# Patient Record
Sex: Female | Born: 1945 | Race: White | Hispanic: No | Marital: Married | Smoking: Never smoker
Health system: Southern US, Community
[De-identification: ages and names within clinical notes are randomized; demographics above are authoritative.]

## PROBLEM LIST (undated history)

## (undated) DIAGNOSIS — R51 Headache: Secondary | ICD-10-CM

## (undated) DIAGNOSIS — E7800 Pure hypercholesterolemia, unspecified: Secondary | ICD-10-CM

## (undated) DIAGNOSIS — Z9889 Other specified postprocedural states: Secondary | ICD-10-CM

## (undated) DIAGNOSIS — I519 Heart disease, unspecified: Secondary | ICD-10-CM

## (undated) DIAGNOSIS — K259 Gastric ulcer, unspecified as acute or chronic, without hemorrhage or perforation: Secondary | ICD-10-CM

## (undated) DIAGNOSIS — I809 Phlebitis and thrombophlebitis of unspecified site: Secondary | ICD-10-CM

## (undated) DIAGNOSIS — K5792 Diverticulitis of intestine, part unspecified, without perforation or abscess without bleeding: Secondary | ICD-10-CM

## (undated) DIAGNOSIS — I10 Essential (primary) hypertension: Secondary | ICD-10-CM

## (undated) DIAGNOSIS — R112 Nausea with vomiting, unspecified: Secondary | ICD-10-CM

## (undated) DIAGNOSIS — E785 Hyperlipidemia, unspecified: Secondary | ICD-10-CM

## (undated) DIAGNOSIS — I2510 Atherosclerotic heart disease of native coronary artery without angina pectoris: Secondary | ICD-10-CM

## (undated) HISTORY — PX: VEIN LIGATION: SHX2652

## (undated) HISTORY — PX: BREAST BIOPSY: SHX20

## (undated) HISTORY — PX: CATARACT EXTRACTION: SUR2

## (undated) HISTORY — DX: Pure hypercholesterolemia, unspecified: E78.00

## (undated) HISTORY — DX: Hyperlipidemia, unspecified: E78.5

## (undated) HISTORY — PX: ABDOMINAL HYSTERECTOMY: SUR658

## (undated) HISTORY — PX: CORONARY STENT PLACEMENT: SHX1402

## (undated) HISTORY — DX: Gastric ulcer, unspecified as acute or chronic, without hemorrhage or perforation: K25.9

## (undated) HISTORY — DX: Heart disease, unspecified: I51.9

## (undated) HISTORY — DX: Phlebitis and thrombophlebitis of unspecified site: I80.9

## (undated) HISTORY — DX: Headache: R51

## (undated) HISTORY — PX: OTHER SURGICAL HISTORY: SHX169

## (undated) HISTORY — DX: Atherosclerotic heart disease of native coronary artery without angina pectoris: I25.10

## (undated) HISTORY — PX: VAGINAL HYSTERECTOMY: SHX2639

---

## 1998-07-14 ENCOUNTER — Ambulatory Visit (HOSPITAL_COMMUNITY): Admission: RE | Admit: 1998-07-14 | Discharge: 1998-07-14 | Payer: Self-pay | Admitting: *Deleted

## 1998-08-25 ENCOUNTER — Ambulatory Visit (HOSPITAL_COMMUNITY): Admission: RE | Admit: 1998-08-25 | Discharge: 1998-08-25 | Payer: Self-pay | Admitting: Gastroenterology

## 2004-01-22 ENCOUNTER — Ambulatory Visit (HOSPITAL_COMMUNITY): Admission: RE | Admit: 2004-01-22 | Discharge: 2004-01-22 | Payer: Self-pay | Admitting: Unknown Physician Specialty

## 2010-01-05 ENCOUNTER — Inpatient Hospital Stay (HOSPITAL_BASED_OUTPATIENT_CLINIC_OR_DEPARTMENT_OTHER): Admission: RE | Admit: 2010-01-05 | Discharge: 2010-01-05 | Payer: Self-pay | Admitting: Cardiology

## 2010-01-05 HISTORY — PX: CARDIAC CATHETERIZATION: SHX172

## 2010-01-08 ENCOUNTER — Inpatient Hospital Stay (HOSPITAL_COMMUNITY): Admission: RE | Admit: 2010-01-08 | Discharge: 2010-01-09 | Payer: Self-pay | Admitting: Cardiology

## 2010-01-08 DIAGNOSIS — I2510 Atherosclerotic heart disease of native coronary artery without angina pectoris: Secondary | ICD-10-CM

## 2010-01-08 HISTORY — DX: Atherosclerotic heart disease of native coronary artery without angina pectoris: I25.10

## 2010-01-08 HISTORY — PX: CARDIAC CATHETERIZATION: SHX172

## 2010-10-05 ENCOUNTER — Ambulatory Visit: Payer: Self-pay | Admitting: Cardiology

## 2011-01-07 ENCOUNTER — Other Ambulatory Visit: Payer: Self-pay | Admitting: *Deleted

## 2011-01-07 DIAGNOSIS — Z1231 Encounter for screening mammogram for malignant neoplasm of breast: Secondary | ICD-10-CM

## 2011-01-18 ENCOUNTER — Ambulatory Visit
Admission: RE | Admit: 2011-01-18 | Discharge: 2011-01-18 | Disposition: A | Discharge status: Home / Self Care | Payer: Medicare Other | Source: Ambulatory Visit | Attending: *Deleted | Admitting: *Deleted

## 2011-01-18 DIAGNOSIS — Z1231 Encounter for screening mammogram for malignant neoplasm of breast: Secondary | ICD-10-CM

## 2011-02-04 LAB — BASIC METABOLIC PANEL
CO2: 25 mEq/L (ref 19–32)
CO2: 27 mEq/L (ref 19–32)
Chloride: 106 mEq/L (ref 96–112)
GFR calc Af Amer: >60 mL/min (ref >60)
GFR calc Af Amer: >60 mL/min (ref >60)
GFR calc non Af Amer: >60 mL/min (ref >60)
Glucose, Bld: 88 mg/dL (ref 70–99)
Glucose, Bld: 97 mg/dL (ref 70–99)
Potassium: 3.6 mEq/L (ref 3.5–5.1)
Potassium: 4.2 mEq/L (ref 3.5–5.1)
Sodium: 139 mEq/L (ref 135–145)
Sodium: 140 mEq/L (ref 135–145)

## 2011-02-04 LAB — CBC
Hemoglobin: 12.8 g/dL (ref 12.0–15.0)
MCV: 88.2 fL (ref 78.0–100.0)
Platelets: 199 10*3/uL (ref 150–400)
RBC: 3.9 MIL/uL (ref 3.87–5.11)
RDW: 12.1 % (ref 11.5–15.5)

## 2011-02-04 LAB — PROTIME-INR: INR: 0.93 (ref 0.00–1.49)

## 2011-04-01 ENCOUNTER — Encounter: Payer: Self-pay | Admitting: Cardiology

## 2011-04-01 DIAGNOSIS — R51 Headache: Secondary | ICD-10-CM | POA: Insufficient documentation

## 2011-04-01 DIAGNOSIS — E7800 Pure hypercholesterolemia, unspecified: Secondary | ICD-10-CM | POA: Insufficient documentation

## 2011-04-01 DIAGNOSIS — B029 Zoster without complications: Secondary | ICD-10-CM | POA: Insufficient documentation

## 2011-04-01 DIAGNOSIS — R519 Headache, unspecified: Secondary | ICD-10-CM | POA: Insufficient documentation

## 2011-04-05 ENCOUNTER — Ambulatory Visit: Payer: Medicare Other | Admitting: Cardiology

## 2011-04-08 ENCOUNTER — Encounter: Payer: Self-pay | Admitting: Cardiology

## 2011-04-08 ENCOUNTER — Ambulatory Visit (INDEPENDENT_AMBULATORY_CARE_PROVIDER_SITE_OTHER): Payer: Medicare Other | Admitting: Cardiology

## 2011-04-08 VITALS — BP 124/70 | HR 71 | Ht 61.0 in | Wt 155.2 lb

## 2011-04-08 DIAGNOSIS — E7800 Pure hypercholesterolemia, unspecified: Secondary | ICD-10-CM

## 2011-04-08 DIAGNOSIS — I2510 Atherosclerotic heart disease of native coronary artery without angina pectoris: Secondary | ICD-10-CM

## 2011-04-08 NOTE — Patient Instructions (Signed)
I would recommend increasing your Lipitor to 20 mg daily.  Recheck your lipids and liver function in 2-3 months.  Your goal LDL is 70. Your last LDL was 106.  I will see you back in 6 months.

## 2011-04-08 NOTE — Assessment & Plan Note (Signed)
She remains asymptomatic at this point. I have encouraged her to remain active. She will remain on aspirin. Continue risk factor modification.

## 2011-04-08 NOTE — Progress Notes (Signed)
   Ashley [name] Date of Birth: 03-21-1946   History of Present Illness: Ashley [name] is seen today for followup. 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 She continues to be active. She's had no significant chest pain or shortness of breath. She has had no myalgias.  Current Outpatient Prescriptions on File Prior to Visit  Medication Sig Dispense Refill  . aspirin 81 MG tablet Take 81 mg by mouth daily.        Marland Kitchen atorvastatin (LIPITOR) 10 MG tablet Take 10 mg by mouth daily.        . Calcium Carbonate-Vitamin D (CALCIUM + D PO) Take 600 mg by mouth daily.        Marland Kitchen estrogens, conjugated, (PREMARIN) 0.625 MG tablet Take 0.625 mg by mouth daily. Take daily for 21 days then do not take for 7 days.       Marland Kitchen DISCONTD: fish oil-omega-3 fatty acids 1000 MG capsule Take 1,200 mg by mouth daily. 3 TABLETS DAILY         Allergies  Allergen Reactions  . Declomycin   . Demerol     Past Medical History  Diagnosis Date  . Headache   . Herpes zoster   . Hypercholesterolemia   . Coronary artery disease 01/08/10    STATUS POST STENTING OF THE LAD AND THE FIRST OBTUSE MARGINAL VESSEL    Past Surgical History  Procedure Date  . Cardiac catheterization 01/08/10  . Cardiac catheterization 01/05/10    NORMAL LEFT VENTRICULAR SIZE AND NORMAL SYSTOLIC FUNCTION. EF IS 60%  . Abdominal hysterectomy     History  Smoking status  . Never Smoker   Smokeless tobacco  . Never Used    History  Alcohol Use No    Family History  Problem Relation Age of Onset  . Cancer Father   . Emphysema Father   . Lupus Child     Review of Systems: All other systems were reviewed and are negative.  Physical Exam: BP 124/70  Pulse 71  Ht 5\' 1"  (1.549 m)  Wt 155 lb 4 oz (70.421 kg)  BMI 29.33 kg/m2 She is a pleasant white female who is in no distress. HEENT exam is unremarkable. She has no JVD or bruits. Lungs are  clear. Cardiac exam reveals a regular rate and rhythm without gallop or murmur. Abdomen is soft and nontender. She has no edema. Pedal pulses are good. Neurologic exam is nonfocal. LABORATORY DATA: ECG demonstrates normal sinus rhythm with a normal ECG.  Assessment / Plan:

## 2011-04-08 NOTE — Assessment & Plan Note (Signed)
Her most recent lipid panel demonstrated a total cholesterol of 178, triglycerides 76, LDL 106, and HDL 57. Her goal LDL is 70. I have recommended increasing her Lipitor to 20 mg per day and repeating a lipid panel in 2-3 months. Otherwise I will follow up again in 6 months.

## 2011-06-30 ENCOUNTER — Ambulatory Visit: Payer: Medicare Other | Admitting: Gastroenterology

## 2011-07-05 ENCOUNTER — Ambulatory Visit (INDEPENDENT_AMBULATORY_CARE_PROVIDER_SITE_OTHER): Payer: Medicare Other | Admitting: Gastroenterology

## 2011-07-05 ENCOUNTER — Encounter: Payer: Self-pay | Admitting: Gastroenterology

## 2011-07-05 VITALS — BP 139/69 | HR 73 | Temp 98.2°F | Ht 61.0 in | Wt 152.2 lb

## 2011-07-05 DIAGNOSIS — R159 Full incontinence of feces: Secondary | ICD-10-CM

## 2011-07-05 DIAGNOSIS — R198 Other specified symptoms and signs involving the digestive system and abdomen: Secondary | ICD-10-CM

## 2011-07-05 DIAGNOSIS — R194 Change in bowel habit: Secondary | ICD-10-CM | POA: Insufficient documentation

## 2011-07-05 HISTORY — DX: Full incontinence of feces: R15.9

## 2011-07-05 NOTE — Assessment & Plan Note (Signed)
Bowel habit change with episodes of passing stool when not having urge. No real incontinence. Now with more constipation. Discuss bowel regimen options. She is resistant to daily medications. Encouraged daily fiber supplement, stool softner, probiotics. Remote TCS, therefore encourage colonoscopy.  I have discussed the risks, alternatives, benefits with regards to but not limited to the risk of reaction to medication, bleeding, infection, perforation and the patient is agreeable to proceed. Written consent to be obtained.

## 2011-07-05 NOTE — Patient Instructions (Addendum)
For constipation you may take colace 100mg  twice daily, fiber supplement that has 3-4 grams of fiber daily. Try adding probiotic to stabilize bowel function. Probiotics can be found in yogurt, Dannon or Activia. You can also buy it in pill form, [facility] is good choice.

## 2011-07-05 NOTE — Progress Notes (Signed)
Cc to PCP 

## 2011-07-05 NOTE — Assessment & Plan Note (Signed)
See bowel habit changes.

## 2011-07-05 NOTE — Progress Notes (Signed)
Primary Care Physician:  Edilia Bo, PA  Primary Gastroenterologist:  Roetta Sessions, MD   Chief Complaint  Patient presents with  . Encopresis  . Constipation    HPI:  Ashley [name] is a 65 y.o. female here for further evaluation of bowel change and fecal incontinence. Reports having remote Egd/tcs years ago in [place]. Denies h/o colon polyps or PUD. Has hh. Recently had gas pain on left abdomen. Saw Prudy Feeler, PA. Had bloodwork done but doesn't know results. LFTs were normal. Also c/o several days of fecal incontinence a few weeks ago. Would pass soft stool with urinating and not be aware of it. Now with c/o constipation. PCP told to her to take miralax but she had nausea with it so she stopped. Took fiber wafer today since no BM. Doesn't take any medications regularly for constipation. Denies heartburn. No dysphagia. No further abd pain.   Current Outpatient Prescriptions  Medication Sig Dispense Refill  . aspirin 81 MG tablet Take 81 mg by mouth daily.        Marland Kitchen atorvastatin (LIPITOR) 10 MG tablet Take 10 mg by mouth daily.        . Calcium Carbonate-Vitamin D (CALCIUM + D PO) Take 600 mg by mouth daily.        Marland Kitchen estrogens, conjugated, (PREMARIN) 0.625 MG tablet Take 0.625 mg by mouth daily. Take daily for 21 days then do not take for 7 days.         Allergies as of 07/05/2011 - Review Complete 07/05/2011  Allergen Reaction Noted  . Declomycin  04/01/2011  . Demerol  04/01/2011    Past Medical History  Diagnosis Date  . Headache   . Herpes zoster   . Hypercholesterolemia   . Coronary artery disease 01/08/10    STATUS POST STENTING OF THE LAD AND THE FIRST OBTUSE MARGINAL VESSEL    Past Surgical History  Procedure Date  . Cardiac catheterization 01/08/10  . Cardiac catheterization 01/05/10    NORMAL LEFT VENTRICULAR SIZE AND NORMAL SYSTOLIC FUNCTION. EF IS 60%.   . Abdominal hysterectomy     with bladder tack  . Vein ligation   . Colonoscopy     remote    Family  History  Problem Relation Age of Onset  . Cancer Father     unknown  . Emphysema Father   . Lupus Child   . Colon cancer Neg Hx     History   Social History  . Marital Status: Married    Spouse Name: N/A    Number of Children: 5  . Years of Education: N/A   Occupational History  . housework    Social History Main Topics  . Smoking status: Never Smoker   . Smokeless tobacco: Never Used  . Alcohol Use: No  . Drug Use: No  . Sexually Active: Not on file   Other Topics Concern  . Not on file   Social History Narrative  . No narrative on file      ROS:  General: Negative for anorexia, weight loss, fever, chills, fatigue, weakness. Eyes: Negative for vision changes.  ENT: Negative for hoarseness, difficulty swallowing , nasal congestion. CV: Negative for chest pain, angina, palpitations, dyspnea on exertion, peripheral edema.  Respiratory: Negative for dyspnea at rest, dyspnea on exertion, cough, sputum, wheezing.  GI: See history of present illness. GU:  Negative for dysuria, hematuria. Some stress urinary incontinence. No urinary frequency, nocturnal urination.  MS: Negative for joint pain, low back  pain.  Derm: Negative for rash or itching.  Neuro: Negative for weakness, abnormal sensation, seizure, frequent headaches, memory loss, confusion.  Psych: Negative for anxiety, depression, suicidal ideation, hallucinations.  Endo: Negative for unusual weight change.  Heme: Negative for bruising or bleeding. Allergy: Negative for rash or hives.    Physical Examination:  BP 139/69  Pulse 73  Temp(Src) 98.2 F (36.8 C) (Temporal)  Ht 5\' 1"  (1.549 m)  Wt 152 lb 3.2 oz (69.037 kg)  BMI 28.76 kg/m2   General: Well-nourished, well-developed in no acute distress.  Head: Normocephalic, atraumatic.   Eyes: Conjunctiva pink, no icterus. Mouth: Oropharyngeal mucosa moist and pink , no lesions erythema or exudate. Neck: Supple without thyromegaly, masses, or  lymphadenopathy.  Lungs: Clear to auscultation bilaterally.  Heart: Regular rate and rhythm, no murmurs rubs or gallops.  Abdomen: Bowel sounds are normal, nontender, nondistended, no hepatosplenomegaly or masses, no abdominal bruits or    hernia , no rebound or guarding.   Rectal: Defer to time of TCS. Extremities: No lower extremity edema. No clubbing or deformities.  Neuro: Alert and oriented x 4 , grossly normal neurologically.  Skin: Warm and dry, no rash or jaundice.   Psych: Alert and cooperative, normal mood and affect.  Labs: Tbili 0.6, AST 25, ALT 21, ALP 52, alb 4

## 2011-07-12 MED ORDER — SODIUM CHLORIDE 0.45 % IV SOLN: Freq: Once | INTRAVENOUS | Status: DC

## 2011-07-13 ENCOUNTER — Encounter (HOSPITAL_COMMUNITY): Payer: Self-pay | Admitting: *Deleted

## 2011-07-13 ENCOUNTER — Encounter (HOSPITAL_COMMUNITY)
Admission: RE | Disposition: A | Discharge status: Home / Self Care | Payer: Self-pay | Source: Ambulatory Visit | Attending: Internal Medicine

## 2011-07-13 ENCOUNTER — Ambulatory Visit (HOSPITAL_COMMUNITY)
Admission: RE | Admit: 2011-07-13 | Discharge: 2011-07-13 | Disposition: A | Discharge status: Home / Self Care | Payer: Medicare Other | Source: Ambulatory Visit | Attending: Internal Medicine | Admitting: Internal Medicine

## 2011-07-13 DIAGNOSIS — R198 Other specified symptoms and signs involving the digestive system and abdomen: Secondary | ICD-10-CM

## 2011-07-13 DIAGNOSIS — Z7982 Long term (current) use of aspirin: Secondary | ICD-10-CM | POA: Insufficient documentation

## 2011-07-13 DIAGNOSIS — D126 Benign neoplasm of colon, unspecified: Secondary | ICD-10-CM | POA: Insufficient documentation

## 2011-07-13 DIAGNOSIS — R159 Full incontinence of feces: Secondary | ICD-10-CM | POA: Insufficient documentation

## 2011-07-13 HISTORY — PX: COLONOSCOPY: SHX5424

## 2011-07-13 SURGERY — COLONOSCOPY
Anesthesia: Moderate Sedation

## 2011-07-13 MED ORDER — MIDAZOLAM HCL 5 MG/5ML IJ SOLN
INTRAMUSCULAR | Status: AC
  Filled 2011-07-13: qty 10

## 2011-07-13 MED ORDER — FENTANYL CITRATE 0.05 MG/ML IJ SOLN
INTRAMUSCULAR | Status: DC | PRN
  Administered 2011-07-13: 50 ug via INTRAVENOUS
  Administered 2011-07-13 (×2): 25 ug via INTRAVENOUS

## 2011-07-13 MED ORDER — ONDANSETRON HCL 4 MG/2ML IJ SOLN
INTRAMUSCULAR | Status: DC | PRN
  Administered 2011-07-13: 4 mg via INTRAVENOUS

## 2011-07-13 MED ORDER — MIDAZOLAM HCL 5 MG/5ML IJ SOLN
INTRAMUSCULAR | Status: DC | PRN
  Administered 2011-07-13 (×2): 1 mg via INTRAVENOUS
  Administered 2011-07-13 (×2): 2 mg via INTRAVENOUS
  Administered 2011-07-13: 1 mg via INTRAVENOUS

## 2011-07-13 MED ORDER — FENTANYL CITRATE 0.05 MG/ML IJ SOLN
INTRAMUSCULAR | Status: AC
  Filled 2011-07-13: qty 2

## 2011-07-13 MED ORDER — ONDANSETRON HCL 4 MG/2ML IJ SOLN
INTRAMUSCULAR | Status: AC
  Filled 2011-07-13: qty 2

## 2011-07-13 NOTE — H&P (Addendum)
Tana Coast, PA 07/05/2011 12:59 PM Signed  Primary Care Physician: Edilia Bo, PA  Primary Gastroenterologist: Roetta Sessions, MD  Chief Complaint   Patient presents with   .  Encopresis   .  Constipation    HPI: Ashley [name] is a 65 y.o. female here for further evaluation of bowel change and fecal incontinence. Reports having remote Egd/tcs years ago in [place]. Denies h/o colon polyps or PUD. Has hh. Recently had gas pain on left abdomen. Saw Prudy Feeler, PA. Had bloodwork done but doesn't know results. LFTs were normal. Also c/o several days of fecal incontinence a few weeks ago. Would pass soft stool with urinating and not be aware of it. Now with c/o constipation. PCP told to her to take miralax but she had nausea with it so she stopped. Took fiber wafer today since no BM. Doesn't take any medications regularly for constipation. Denies heartburn. No dysphagia. No further abd pain.  Current Outpatient Prescriptions   Medication  Sig  Dispense  Refill   .  aspirin 81 MG tablet  Take 81 mg by mouth daily.     Marland Kitchen  atorvastatin (LIPITOR) 10 MG tablet  Take 10 mg by mouth daily.     .  Calcium Carbonate-Vitamin D (CALCIUM + D PO)  Take 600 mg by mouth daily.     Marland Kitchen  estrogens, conjugated, (PREMARIN) 0.625 MG tablet  Take 0.625 mg by mouth daily. Take daily for 21 days then do not take for 7 days.      Allergies as of 07/05/2011 - Review Complete 07/05/2011   Allergen  Reaction  Noted   .  Declomycin   04/01/2011   .  Demerol   04/01/2011    Past Medical History   Diagnosis  Date   .  Headache    .  Herpes zoster    .  Hypercholesterolemia    .  Coronary artery disease  01/08/10     STATUS POST STENTING OF THE LAD AND THE FIRST OBTUSE MARGINAL VESSEL    Past Surgical History   Procedure  Date   .  Cardiac catheterization  01/08/10   .  Cardiac catheterization  01/05/10     NORMAL LEFT VENTRICULAR SIZE AND NORMAL SYSTOLIC FUNCTION. EF IS 60%.   .  Abdominal hysterectomy      with bladder  tack   .  Vein ligation    .  Colonoscopy      remote    Family History   Problem  Relation  Age of Onset   .  Cancer  Father       unknown    .  Emphysema  Father    .  Lupus  Child    .  Colon cancer  Neg Hx     History    Social History   .  Marital Status:  Married     Spouse Name:  N/A     Number of Children:  5   .  Years of Education:  N/A    Occupational History   .  housework     Social History Main Topics   .  Smoking status:  Never Smoker   .  Smokeless tobacco:  Never Used   .  Alcohol Use:  No   .  Drug Use:  No   .  Sexually Active:  Not on file    Other Topics  Concern   .  Not on file  Social History Narrative   .  No narrative on file    ROS:  General: Negative for anorexia, weight loss, fever, chills, fatigue, weakness.  Eyes: Negative for vision changes.  ENT: Negative for hoarseness, difficulty swallowing , nasal congestion.  CV: Negative for chest pain, angina, palpitations, dyspnea on exertion, peripheral edema.  Respiratory: Negative for dyspnea at rest, dyspnea on exertion, cough, sputum, wheezing.  GI: See history of present illness.  GU: Negative for dysuria, hematuria. Some stress urinary incontinence. No urinary frequency, nocturnal urination.  MS: Negative for joint pain, low back pain.  Derm: Negative for rash or itching.  Neuro: Negative for weakness, abnormal sensation, seizure, frequent headaches, memory loss, confusion.  Psych: Negative for anxiety, depression, suicidal ideation, hallucinations.  Endo: Negative for unusual weight change.  Heme: Negative for bruising or bleeding.  Allergy: Negative for rash or hives.  Physical Examination:  BP 139/69  Pulse 73  Temp(Src) 98.2 F (36.8 C) (Temporal)  Ht 5\' 1"  (1.549 m)  Wt 152 lb 3.2 oz (69.037 kg)  BMI 28.76 kg/m2  General: Well-nourished, well-developed in no acute distress.  Head: Normocephalic, atraumatic.  Eyes: Conjunctiva pink, no icterus.  Mouth: Oropharyngeal  mucosa moist and pink , no lesions erythema or exudate.  Neck: Supple without thyromegaly, masses, or lymphadenopathy.  Lungs: Clear to auscultation bilaterally.  Heart: Regular rate and rhythm, no murmurs rubs or gallops.  Abdomen: Bowel sounds are normal, nontender, nondistended, no hepatosplenomegaly or masses, no abdominal bruits or hernia , no rebound or guarding.  Rectal: Defer to time of TCS.  Extremities: No lower extremity edema. No clubbing or deformities.  Neuro: Alert and oriented x 4 , grossly normal neurologically.  Skin: Warm and dry, no rash or jaundice.  Psych: Alert and cooperative, normal mood and affect.  Labs:  Tbili 0.6, AST 25, ALT 21, ALP 52, alb 4   Leigh A Watson 07/05/2011 1:10 PM Signed  Cc to PCP           Bowel habit changes - Tana Coast, PA 07/05/2011 12:56 PM Signed  Bowel habit change with episodes of passing stool when not having urge. No real incontinence. Now with more constipation. Discuss bowel regimen options. She is resistant to daily medications. Encouraged daily fiber supplement, stool softner, probiotics. Remote TCS, therefore encourage colonoscopy. I have discussed the risks, alternatives, benefits with regards to but not limited to the risk of reaction to medication, bleeding, infection, perforation and the patient is agreeable to proceed. Written consent to be obtained.   Fecal incontinence - Tana Coast, PA 07/05/2011 12:56 PM Signed  See bowel habit changes.      I have seen the patient prior to the procedure(s) today and reviewed the history and physical / consultation from 07/05/11.  There have been no changes. After consideration of the risks, benefits, alternatives and imponderables, the patient has consented to the procedure(s).

## 2011-07-13 NOTE — Progress Notes (Signed)
During colonoscopy, a splenic flexure polyp was removed by cold snare, but was unable to be retrieved. Dr. Jena Gauss was notified.

## 2011-07-21 ENCOUNTER — Encounter (HOSPITAL_COMMUNITY): Payer: Self-pay | Admitting: Internal Medicine

## 2011-08-08 ENCOUNTER — Encounter: Payer: Self-pay | Admitting: Internal Medicine

## 2011-08-08 NOTE — Progress Notes (Signed)
5 yr Repeat TCS is in the computer

## 2011-08-08 NOTE — Progress Notes (Signed)
  Please call pt and see how he is doing  Please her know polyp was too small to be processed (no path); recommend F/U tcs 5 years  Please confirm f/u appt w pt

## 2011-08-11 NOTE — Progress Notes (Signed)
Tried to call pt- LMOM 

## 2011-08-11 NOTE — Progress Notes (Signed)
Pt has follow up ov on 08/22/11

## 2011-08-19 NOTE — Progress Notes (Signed)
Pt aware, she is coming to appt on Monday.

## 2011-08-22 ENCOUNTER — Encounter: Payer: Self-pay | Admitting: Gastroenterology

## 2011-08-22 ENCOUNTER — Ambulatory Visit (INDEPENDENT_AMBULATORY_CARE_PROVIDER_SITE_OTHER): Payer: Medicare Other | Admitting: Gastroenterology

## 2011-08-22 VITALS — BP 110/70 | HR 75 | Temp 98.0°F | Ht 61.0 in | Wt 149.2 lb

## 2011-08-22 DIAGNOSIS — R198 Other specified symptoms and signs involving the digestive system and abdomen: Secondary | ICD-10-CM

## 2011-08-22 DIAGNOSIS — R194 Change in bowel habit: Secondary | ICD-10-CM

## 2011-08-22 DIAGNOSIS — R159 Full incontinence of feces: Secondary | ICD-10-CM

## 2011-08-22 MED ORDER — INULIN 2 G PO CHEW: 2.0000 | CHEWABLE_TABLET | Freq: Every day | ORAL | Status: DC

## 2011-08-22 NOTE — Assessment & Plan Note (Signed)
Intermittent constipation but she also has several days per week where she stools frequently with urination. States she feels like her rectum doesn't empty completely as the stool just keeps coming. Lax sphincter tone on exam. As previously recommended, suggest stool bulking agent such as fiber initially. If fiber supplement does not help then we could consider low-dose resin binder to make her relatively constipated. See patient instructions for details. PR in two weeks.

## 2011-08-22 NOTE — Progress Notes (Signed)
Primary Care Physician: Alcide Evener, DO  Primary Gastroenterologist:  Roetta Sessions, MD   Chief Complaint  Patient presents with  . Follow-up    constipation    HPI: Ashley [name] is a 65 y.o. female here for followup. Recently had a colonoscopy which showed redundant colon, left-sided diverticula, small polyp at the splenic flexure, lax sphincter tone. Since her last office visit, she started taking [employer] colon health. She is not taking a fiber supplement. Continues to have several days and right were she will have stool come out when she urinates. This happens usually around 9 AM and she is having to miss morning mass. Then she may have one or 2 days without a bowel movement. She is doing the recommended Kegel exercises. Denies melena or rectal bleeding.    Current Outpatient Prescriptions  Medication Sig Dispense Refill  . aspirin 81 MG tablet Take 81 mg by mouth daily.        Marland Kitchen atorvastatin (LIPITOR) 10 MG tablet Take 10 mg by mouth daily.        . Calcium Carbonate-Vitamin D (CALCIUM + D PO) Take 600 mg by mouth daily.        Marland Kitchen estrogens, conjugated, (PREMARIN) 0.625 MG tablet Take 0.625 mg by mouth daily. Take daily for 21 days then do not take for 7 days.       . Probiotic Product ([employer]IPS COLON HEALTH PO) Take by mouth.        . Inulin (FIBERCHOICE) 2 G CHEW Chew 2 tablets (4 g total) by mouth daily.    0    Allergies as of 08/22/2011 - Review Complete 08/22/2011  Allergen Reaction Noted  . Declomycin Hives 04/01/2011  . Demerol Nausea And Vomiting 04/01/2011    ROS:  General: Negative for anorexia, weight loss, fever, chills, fatigue, weakness. ENT: Negative for hoarseness, difficulty swallowing , nasal congestion. CV: Negative for chest pain, angina, palpitations, dyspnea on exertion, peripheral edema.  Respiratory: Negative for dyspnea at rest, dyspnea on exertion, cough, sputum, wheezing.  GI: See history of present illness. GU:  Negative for dysuria,  hematuria, urinary incontinence, nocturnal urination. Urinates frequently due to her stress urinary incontinence  Endo: Negative for unusual weight change.    Physical Examination:   BP 110/70  Pulse 75  Temp(Src) 98 F (36.7 C) (Temporal)  Ht 5\' 1"  (1.549 m)  Wt 149 lb 3.2 oz (67.677 kg)  BMI 28.19 kg/m2  General: Well-nourished, well-developed in no acute distress.  Eyes: No icterus. Mouth: Oropharyngeal mucosa moist and pink , no lesions erythema or exudate. Lungs: Clear to auscultation bilaterally.  Heart: Regular rate and rhythm, no murmurs rubs or gallops.  Abdomen: Bowel sounds are normal, nontender, nondistended, no hepatosplenomegaly or masses, no abdominal bruits or hernia , no rebound or guarding.   Extremities: No lower extremity edema. No clubbing or deformities. Neuro: Alert and oriented x 4   Skin: Warm and dry, no jaundice.   Psych: Alert and cooperative, normal mood and affect.

## 2011-08-22 NOTE — Patient Instructions (Addendum)
Please begin separate fiber supplement. FiberChoice two chewable tablets daily OR Benefiber chewable tablets (3-4 grams daily). Please continue [employer] [facility].  Call in two weeks with progress report. If you still have problems with uncontrollable stooling, we will start you on Questran, low-dose. Continue Kegel exercises for 10 minutes twice daily.

## 2011-08-22 NOTE — Progress Notes (Signed)
Cc to PCP 

## 2011-10-14 ENCOUNTER — Encounter: Payer: Self-pay | Admitting: Cardiology

## 2011-10-14 ENCOUNTER — Ambulatory Visit (INDEPENDENT_AMBULATORY_CARE_PROVIDER_SITE_OTHER): Payer: Medicare Other | Admitting: Cardiology

## 2011-10-14 VITALS — BP 124/66 | HR 60 | Ht 61.0 in | Wt 144.0 lb

## 2011-10-14 DIAGNOSIS — I2510 Atherosclerotic heart disease of native coronary artery without angina pectoris: Secondary | ICD-10-CM

## 2011-10-14 DIAGNOSIS — E7800 Pure hypercholesterolemia, unspecified: Secondary | ICD-10-CM

## 2011-10-14 DIAGNOSIS — E785 Hyperlipidemia, unspecified: Secondary | ICD-10-CM

## 2011-10-14 NOTE — Assessment & Plan Note (Signed)
She remains asymptomatic. We will continue with risk factor modification. I will followup again in 6 months.

## 2011-10-14 NOTE — Assessment & Plan Note (Signed)
Improved lipid control on Lipitor. We will continue with her current dose.

## 2011-10-14 NOTE — Patient Instructions (Signed)
Continue your current medications.  Stay active.  I will see you again in 6 months.   

## 2011-10-14 NOTE — Progress Notes (Signed)
   [name] Date of Birth: [birth year]   History of Present Illness: Ashley [name] is seen today for followup. She states she has been doing very well and remains very active. Her husband is on a began diet and she partakes in this much of the time. She is tolerating the higher dose of Lipitor. She denies any chest pain or shortness of breath.  Current Outpatient Prescriptions on File Prior to Visit  Medication Sig Dispense Refill  . aspirin 81 MG tablet Take 81 mg by mouth daily.        . Calcium Carbonate-Vitamin D (CALCIUM + D PO) Take 600 mg by mouth daily.        Marland Kitchen estrogens, conjugated, (PREMARIN) 0.625 MG tablet Take 0.625 mg by mouth daily. Take daily for 21 days then do not take for 7 days.       . Inulin (FIBERCHOICE) 2 G CHEW Chew 2 tablets (4 g total) by mouth daily.    0  . Probiotic Product (PHILLIPS COLON HEALTH PO) Take by mouth.          Allergies  Allergen Reactions  . Declomycin Hives  . Demerol Nausea And Vomiting    Past Medical History  Diagnosis Date  . Headache   . Herpes zoster   . Hypercholesterolemia   . Coronary artery disease 01/08/10    STATUS POST STENTING OF THE LAD AND THE FIRST OBTUSE MARGINAL VESSEL    Past Surgical History  Procedure Date  . Cardiac catheterization 01/08/10  . Cardiac catheterization 01/05/10    NORMAL LEFT VENTRICULAR SIZE AND NORMAL SYSTOLIC FUNCTION. EF IS 60%.   . Abdominal hysterectomy     with bladder tack  . Vein ligation   . Colonoscopy 07/13/2011    Procedure: COLONOSCOPY;  Surgeon: Corbin Ade, MD;  Location: [facility];  Service: Endoscopy;  Laterality: N/A;  10:00 AM, lax sphincter tone, left-sided diverticula, small polyp too small to analyze. Redundant colon. Next colonoscopy recommended August 2017.    History  Smoking status  . Never Smoker   Smokeless tobacco  . Never Used    History  Alcohol Use No    Family History  Problem Relation Age of Onset  . Cancer Father     unknown  .  Emphysema Father   . Lupus Child   . Colon cancer Neg Hx     Review of Systems: As noted in history of present illness. All other systems were reviewed and are negative.  Physical Exam: BP 124/66  Pulse 60  Ht 5\' 1"  (1.549 m)  Wt 144 lb (65.318 kg)  BMI 27.21 kg/m2 She is a pleasant white female who is in no distress. HEENT exam is unremarkable. She has no JVD or bruits. Lungs are clear. Cardiac exam reveals a regular rate and rhythm without gallop or murmur. Abdomen is soft and nontender. She has no edema. Pedal pulses are good. Neurologic exam is nonfocal. LABORATORY DATA: Her last blood work was reviewed from August and showed a 20 point reduction in her cholesterol. Her HDL and triglyceride levels were stable.  Assessment / Plan:

## 2011-12-16 ENCOUNTER — Other Ambulatory Visit: Payer: Self-pay | Admitting: *Deleted

## 2011-12-16 DIAGNOSIS — Z1231 Encounter for screening mammogram for malignant neoplasm of breast: Secondary | ICD-10-CM

## 2012-01-24 ENCOUNTER — Ambulatory Visit
Admission: RE | Admit: 2012-01-24 | Discharge: 2012-01-24 | Disposition: A | Discharge status: Home / Self Care | Payer: Medicare Other | Source: Ambulatory Visit | Attending: *Deleted | Admitting: *Deleted

## 2012-01-24 DIAGNOSIS — Z1231 Encounter for screening mammogram for malignant neoplasm of breast: Secondary | ICD-10-CM

## 2012-08-15 DIAGNOSIS — Z23 Encounter for immunization: Secondary | ICD-10-CM | POA: Diagnosis not present

## 2012-09-17 DIAGNOSIS — E785 Hyperlipidemia, unspecified: Secondary | ICD-10-CM | POA: Diagnosis not present

## 2012-09-17 DIAGNOSIS — Z79899 Other long term (current) drug therapy: Secondary | ICD-10-CM | POA: Diagnosis not present

## 2012-10-04 ENCOUNTER — Ambulatory Visit (INDEPENDENT_AMBULATORY_CARE_PROVIDER_SITE_OTHER): Payer: Medicare Other | Admitting: Cardiology

## 2012-10-04 ENCOUNTER — Encounter: Payer: Self-pay | Admitting: Cardiology

## 2012-10-04 VITALS — BP 148/70 | HR 63 | Ht 61.0 in | Wt 143.8 lb

## 2012-10-04 DIAGNOSIS — I2510 Atherosclerotic heart disease of native coronary artery without angina pectoris: Secondary | ICD-10-CM | POA: Diagnosis not present

## 2012-10-04 DIAGNOSIS — E7800 Pure hypercholesterolemia, unspecified: Secondary | ICD-10-CM | POA: Diagnosis not present

## 2012-10-04 NOTE — Patient Instructions (Signed)
Continue your current therapy  I will see you again in 1 year.   

## 2012-10-04 NOTE — Progress Notes (Signed)
Ashley [name] Date of Birth: 07/01/1946   History of Present Illness: [name] is seen today for followup. She states she is done very well this year without any significant cardiac symptoms. She remains very active bowling and playing paddle ball. She denies any symptoms of chest pain or shortness of breath. She is status post stenting of the Roxanol LAD and first obtuse marginal vessel in 2011 with bare-metal stents. She is trying to follow a heart healthy diet.  Current Outpatient Prescriptions on File Prior to Visit  Medication Sig Dispense Refill  . aspirin 81 MG tablet Take 81 mg by mouth daily.        Marland Kitchen atorvastatin (LIPITOR) 20 MG tablet Take 20 mg by mouth daily.        . Calcium Carbonate-Vitamin D (CALCIUM + D PO) Take 600 mg by mouth daily.        Marland Kitchen estrogens, conjugated, (PREMARIN) 0.625 MG tablet Take 0.625 mg by mouth daily. Take daily for 21 days then do not take for 7 days.       . Inulin (FIBERCHOICE) 2 G CHEW Chew 2 tablets (4 g total) by mouth daily.    0  . Probiotic Product (PHILLIPS COLON HEALTH PO) Take by mouth.          Allergies  Allergen Reactions  . Demeclocycline Hcl Hives  . Demerol Nausea And Vomiting  . Moviprep (Peg-Kcl-Nacl-Nasulf-Na Asc-C)     Past Medical History  Diagnosis Date  . Headache   . Herpes zoster   . Hypercholesterolemia   . Coronary artery disease 01/08/10    STATUS POST STENTING OF THE LAD AND THE FIRST OBTUSE MARGINAL VESSEL    Past Surgical History  Procedure Date  . Cardiac catheterization 01/08/10  . Cardiac catheterization 01/05/10    NORMAL LEFT VENTRICULAR SIZE AND NORMAL SYSTOLIC FUNCTION. EF IS 60%.   . Abdominal hysterectomy     with bladder tack  . Vein ligation   . Colonoscopy 07/13/2011    Procedure: COLONOSCOPY;  Surgeon: Corbin Ade, MD;  Location: [facility];  Service: Endoscopy;  Laterality: N/A;  10:00 AM, lax sphincter tone, left-sided diverticula, small polyp too small to analyze. Redundant  colon. Next colonoscopy recommended August 2017.    History  Smoking status  . Never Smoker   Smokeless tobacco  . Never Used    History  Alcohol Use No    Family History  Problem Relation Age of Onset  . Cancer Father     unknown  . Emphysema Father   . Lupus Child   . Colon cancer Neg Hx     Review of Systems: As noted in history of present illness. All other systems were reviewed and are negative.  Physical Exam: BP 148/70  Pulse 63  Ht 5\' 1"  (1.549 m)  Wt 143 lb 12.8 oz (65.227 kg)  BMI 27.17 kg/m2  SpO2 99% She is a pleasant white female who is in no distress. HEENT exam is unremarkable. She has no JVD or bruits. Lungs are clear. Cardiac exam reveals a regular rate and rhythm without gallop or murmur. Abdomen is soft and nontender. She has no edema. Pedal pulses are good. Neurologic exam is nonfocal.  LABORATORY DATA: ECG today is normal. Blood work dated 09/18/2012 shows a normal chemistry panel, CBC, and TSH. Total cholesterol 196, triglycerides 72, HDL 74, LDL 108.  Assessment / Plan: 1. Coronary disease status post 2 vessel stenting with bare-metal stents 2011. She  remains asymptomatic. Continue with aspirin therapy. Continue on statin with Lipitor. I will followup again in one year.  2. Hypercholesterolemia. Compared to a year ago her LDL has increased by 20 points but still has her HDL. We will continue on 20 mg of Lipitor and encouraged and Mediterranean style diet.

## 2012-10-05 DIAGNOSIS — H40019 Open angle with borderline findings, low risk, unspecified eye: Secondary | ICD-10-CM | POA: Diagnosis not present

## 2012-10-05 DIAGNOSIS — H01009 Unspecified blepharitis unspecified eye, unspecified eyelid: Secondary | ICD-10-CM | POA: Diagnosis not present

## 2012-10-05 DIAGNOSIS — H2510 Age-related nuclear cataract, unspecified eye: Secondary | ICD-10-CM | POA: Diagnosis not present

## 2012-10-05 DIAGNOSIS — H04129 Dry eye syndrome of unspecified lacrimal gland: Secondary | ICD-10-CM | POA: Diagnosis not present

## 2012-11-08 DIAGNOSIS — R51 Headache: Secondary | ICD-10-CM | POA: Diagnosis not present

## 2013-01-03 ENCOUNTER — Other Ambulatory Visit: Payer: Self-pay | Admitting: *Deleted

## 2013-01-16 DIAGNOSIS — J029 Acute pharyngitis, unspecified: Secondary | ICD-10-CM | POA: Diagnosis not present

## 2013-01-29 ENCOUNTER — Ambulatory Visit: Payer: Medicare Other

## 2013-02-13 ENCOUNTER — Ambulatory Visit
Admission: RE | Admit: 2013-02-13 | Discharge: 2013-02-13 | Disposition: A | Discharge status: Home / Self Care | Payer: Medicare Other | Source: Ambulatory Visit | Attending: *Deleted | Admitting: *Deleted

## 2013-02-13 DIAGNOSIS — Z1231 Encounter for screening mammogram for malignant neoplasm of breast: Secondary | ICD-10-CM | POA: Diagnosis not present

## 2013-02-15 ENCOUNTER — Other Ambulatory Visit: Payer: Self-pay | Admitting: *Deleted

## 2013-02-15 DIAGNOSIS — R928 Other abnormal and inconclusive findings on diagnostic imaging of breast: Secondary | ICD-10-CM

## 2013-03-06 ENCOUNTER — Ambulatory Visit
Admission: RE | Admit: 2013-03-06 | Discharge: 2013-03-06 | Disposition: A | Discharge status: Home / Self Care | Payer: Medicare Other | Source: Ambulatory Visit | Attending: *Deleted | Admitting: *Deleted

## 2013-03-06 ENCOUNTER — Other Ambulatory Visit: Payer: Medicare Other

## 2013-03-06 DIAGNOSIS — R928 Other abnormal and inconclusive findings on diagnostic imaging of breast: Secondary | ICD-10-CM

## 2013-05-03 DIAGNOSIS — R51 Headache: Secondary | ICD-10-CM | POA: Diagnosis not present

## 2013-05-03 DIAGNOSIS — J329 Chronic sinusitis, unspecified: Secondary | ICD-10-CM | POA: Diagnosis not present

## 2013-07-23 ENCOUNTER — Encounter: Payer: Self-pay | Admitting: Obstetrics & Gynecology

## 2013-07-23 ENCOUNTER — Ambulatory Visit (INDEPENDENT_AMBULATORY_CARE_PROVIDER_SITE_OTHER): Payer: Medicare Other | Admitting: Obstetrics & Gynecology

## 2013-07-23 VITALS — BP 156/68 | HR 79 | Resp 16 | Ht 61.0 in | Wt 143.0 lb

## 2013-07-23 DIAGNOSIS — N951 Menopausal and female climacteric states: Secondary | ICD-10-CM

## 2013-07-23 DIAGNOSIS — Z01419 Encounter for gynecological examination (general) (routine) without abnormal findings: Secondary | ICD-10-CM

## 2013-07-23 DIAGNOSIS — N952 Postmenopausal atrophic vaginitis: Secondary | ICD-10-CM | POA: Insufficient documentation

## 2013-07-23 DIAGNOSIS — R350 Frequency of micturition: Secondary | ICD-10-CM

## 2013-07-23 HISTORY — DX: Menopausal and female climacteric states: N95.1

## 2013-07-23 HISTORY — DX: Postmenopausal atrophic vaginitis: N95.2

## 2013-07-23 LAB — POCT URINALYSIS DIPSTICK
Bilirubin, UA: NEGATIVE
Glucose, UA: NEGATIVE
Ketones, UA: NEGATIVE
Nitrite, UA: NEGATIVE

## 2013-07-23 MED ORDER — ESTRADIOL 10 MCG VA TABS: 1.0000 | ORAL_TABLET | Freq: Every morning | VAGINAL | Status: DC

## 2013-07-23 NOTE — Patient Instructions (Signed)
Atrophic Vaginitis Atrophic vaginitis is a problem of low levels of estrogen in women. This problem can happen at any age. It is most common in women who have gone through menopause ("the change").  HOW WILL I KNOW IF I HAVE THIS PROBLEM? You may have:  Trouble with peeing (urinating), such as:  Going to the bathroom often.  A hard time holding your pee until you reach a bathroom.  Leaking pee.  Having pain when you pee.  Itching or a burning feeling.  Vaginal bleeding and spotting.  Pain during sex.  Dryness of the vagina.  A yellow, bad-smelling fluid (discharge) coming from the vagina. HOW WILL MY DOCTOR CHECK FOR THIS PROBLEM?  During your exam, your doctor will likely find the problem.  If there is a vaginal fluid, it may be checked for infection. HOW WILL THIS PROBLEM BE TREATED? Keep the vulvar skin as clean as possible. Moisturizers and lubricants can help with some of the symptoms. Estrogen replacement can help. There are 2 ways to take estrogen:  Systemic estrogen gets estrogen to your whole body. It takes many weeks or months before the symptoms get better.  You take an estrogen pill.  You use a skin patch. This is a patch that you put on your skin.  If you still have your uterus, your doctor may ask you to take a hormone. Talk to your doctor about the right medicine for you.  Estrogen cream.  This puts estrogen only at the part of your body where you apply it. The cream is put into the vagina or put on the vulvar skin. For some women, estrogen cream works faster than pills or the patch. CAN ALL WOMEN WITH THIS PROBLEM USE ESTROGEN? No. Women with certain types of cancer, liver problems, or problems with blood clots should not take estrogen. Your doctor can help you decide the best treatment for your symptoms. Document Released: 04/18/2008 Document Revised: 01/23/2012 Document Reviewed: 04/18/2008 [employer] Patient Information 2014 [employer].  

## 2013-07-23 NOTE — Progress Notes (Signed)
Patient ID: Ashley [name], female   DOB: 04-21-1946, 67 y.o.   MRN: [id number]  Chief Complaint  Patient presents with  . Gynecologic Exam    Vaginal dryness during intercourse - Pt states she has frequent urination     HPI Ashley [name] is a 67 y.o. female.  [id number] No LMP recorded. Patient has had a hysterectomy. She was on estrogen for 21 years and has developed vaginal dryness and irritation since stopping.   HPI  Past Medical History  Diagnosis Date  . Stomach ulcer   . Hyperlipidemia   . Heart disease   . Phlebitis Right    Leg    Past Surgical History  Procedure Laterality Date  . Vaginal hysterectomy    . Coronary stent placement    . Breast biopsy Left     Normal  . Bunionectomy Right     Family History  Problem Relation Age of Onset  . Heart disease Sister   . Heart disease Paternal Grandmother   . Heart disease Maternal Grandmother   . Hypertension Paternal Grandmother   . Hypertension Maternal Grandmother   . Colon cancer Father     Social History History  Substance Use Topics  . Smoking status: Never Smoker   . Smokeless tobacco: Never Used  . Alcohol Use: Yes     Comment: Socially    Allergies  Allergen Reactions  . Demerol [Meperidine] Nausea And Vomiting  . Declomycin [Demeclocycline] Rash    Current Outpatient Prescriptions  Medication Sig Dispense Refill  . ascorbic acid (VITAMIN C) 1000 MG tablet Take 1,000 mg by mouth daily.      Marland Kitchen aspirin 81 MG tablet Take 81 mg by mouth daily.      Marland Kitchen atorvastatin (LIPITOR) 20 MG tablet Take 20 mg by mouth daily.      . calcium carbonate (OS-CAL) 600 MG TABS tablet Take 600 mg by mouth 2 (two) times daily with a meal.      . Probiotic Product (HEALTHY COLON PO) Take 1 tablet by mouth daily.      . Thiamine HCl (VITAMIN B-1) 250 MG tablet Take 250 mg by mouth daily.      . Estradiol 10 MCG TABS vaginal tablet Place 1 tablet (10 mcg total) vaginally every morning.  30 tablet  1   No current  facility-administered medications for this visit.    Review of Systems Review of Systems  Constitutional: Negative for fever.  Respiratory: Negative for chest tightness and shortness of breath.   Cardiovascular: Negative for chest pain.  Gastrointestinal: Positive for diarrhea. Negative for vomiting, abdominal distention and anal bleeding.  Genitourinary: Positive for frequency and dyspareunia. Negative for vaginal bleeding, vaginal discharge and pelvic pain.    Blood pressure 156/68, pulse 79, resp. rate 16, height 5\' 1"  (1.549 m), weight 143 lb (64.864 kg).  Physical Exam Physical Exam  Nursing note and vitals reviewed. Constitutional: She is oriented to person, place, and time. She appears well-developed and well-nourished. No distress.  HENT:  Head: Normocephalic.  Neck: Normal range of motion.  Pulmonary/Chest: Effort normal. No respiratory distress.  Breasts symmetric, no lesions, LA, mass, tenderness  Abdominal: Soft. She exhibits no distension and no mass. There is no tenderness.  Genitourinary: Vagina normal.  Scant discharge, minimally atrophic, no masses, possible tender lymph node right superior vulva  Neurological: She is alert and oriented to person, place, and time.  Skin: Skin is warm and dry.  Psychiatric: She has a normal mood and affect.  Her behavior is normal.    Data Reviewed    Assessment    Mild vaginal atroophy, urinary frequency long standing     Plan    Urine culture, wet prep. Vagifem tablet vaginally  Daily for 2 weeks then 3/week for 8 weeks. Mammogram yearly.   ARNOLD,JAMES 07/23/2013, 9:50 AM

## 2013-07-24 LAB — WET PREP BY MOLECULAR PROBE
Candida species: NEGATIVE
Trichomonas vaginosis: NEGATIVE

## 2013-07-25 ENCOUNTER — Encounter: Payer: Self-pay | Admitting: Cardiology

## 2013-07-25 LAB — CULTURE, URINE COMPREHENSIVE
Colony Count: NO GROWTH
Organism ID, Bacteria: NO GROWTH

## 2013-07-30 ENCOUNTER — Telehealth: Payer: Self-pay | Admitting: *Deleted

## 2013-07-30 NOTE — Telephone Encounter (Signed)
Called pt to see if she was having any Sx of the BV she adv was not having any Sx and all is well. She has started the Vagifem and it seems to be working well. She will call if she needs further treatment.

## 2013-08-15 DIAGNOSIS — Z23 Encounter for immunization: Secondary | ICD-10-CM | POA: Diagnosis not present

## 2013-10-08 DIAGNOSIS — H40019 Open angle with borderline findings, low risk, unspecified eye: Secondary | ICD-10-CM | POA: Diagnosis not present

## 2013-10-08 DIAGNOSIS — H0230 Blepharochalasis unspecified eye, unspecified eyelid: Secondary | ICD-10-CM | POA: Diagnosis not present

## 2013-10-08 DIAGNOSIS — H04129 Dry eye syndrome of unspecified lacrimal gland: Secondary | ICD-10-CM | POA: Diagnosis not present

## 2013-10-08 DIAGNOSIS — H2589 Other age-related cataract: Secondary | ICD-10-CM | POA: Diagnosis not present

## 2013-11-18 ENCOUNTER — Telehealth: Payer: Self-pay | Admitting: Internal Medicine

## 2013-11-18 NOTE — Telephone Encounter (Signed)
Patient C/O bowel leakage, Knot/lymph node located on or around the groin area that is aggravated please advise?

## 2013-11-18 NOTE — Telephone Encounter (Signed)
Spoke with pt- she is having some constipation with rectal leakage and what she believes is hemorrhoids that are burning and itching. Pt has not been seen since 2012. Made her an appt to be seen. She is aware of date and time.

## 2013-11-20 ENCOUNTER — Encounter: Payer: Self-pay | Admitting: Gastroenterology

## 2013-11-20 ENCOUNTER — Ambulatory Visit (INDEPENDENT_AMBULATORY_CARE_PROVIDER_SITE_OTHER): Payer: Medicare Other | Admitting: Gastroenterology

## 2013-11-20 ENCOUNTER — Encounter (INDEPENDENT_AMBULATORY_CARE_PROVIDER_SITE_OTHER): Payer: Self-pay

## 2013-11-20 VITALS — BP 156/56 | HR 66 | Temp 97.0°F | Wt 143.6 lb

## 2013-11-20 DIAGNOSIS — R159 Full incontinence of feces: Secondary | ICD-10-CM | POA: Diagnosis not present

## 2013-11-20 MED ORDER — CHOLESTYRAMINE 4 GM/DOSE PO POWD: 2.0000 g | Freq: Every day | ORAL | Status: DC

## 2013-11-20 NOTE — Patient Instructions (Signed)
Start taking Fiber supplements for one week. If you have no improvement, start taking Questran 2 grams each day. Do not take your other medications within 4 hours of Questran.  We will see you back in 4 weeks.

## 2013-11-20 NOTE — Progress Notes (Signed)
Referring Provider: Octavio Graves, DO Primary Care Physician:  Octavio Graves, DO Primary GI: Dr. Gala Romney   Chief Complaint  Patient presents with  . Hemorrhoids    HPI:   [facility] [place] returns today regarding fecal incontinence and hemorrhoids. She was last seen here in 2012. Colonoscopy on file with lax anal sphincter tone and left-sided diverticula. Fecal incontinence without patient even knowing.  Feels like BMs are never finishing. Wipes and wipes forever. Has intermittent constipation but very rare. Has formed stool, followed by mush. Will have some days that she skips a day or so. No abdominal pain. No rectal bleeding. Used Prep H for rectal burning but now comfortable.    Has a boil in right groin. Pressed on it a few days ago, and it got bigger. No drainage.   Past Medical History  Diagnosis Date  . Headache(784.0)   . Herpes zoster   . Hypercholesterolemia   . Coronary artery disease 01/08/10    STATUS POST STENTING OF THE LAD AND THE FIRST OBTUSE MARGINAL VESSEL  . Stomach ulcer   . Hyperlipidemia   . Heart disease   . Phlebitis Right    Leg    Past Surgical History  Procedure Laterality Date  . Cardiac catheterization  01/08/10  . Cardiac catheterization  01/05/10    NORMAL LEFT VENTRICULAR SIZE AND NORMAL SYSTOLIC FUNCTION. EF IS 60%.   . Abdominal hysterectomy      with bladder tack  . Vein ligation    . Colonoscopy  07/13/2011    RMR:Lax anal sphincter tone otherwise normal/left sided diverticula  . Vaginal hysterectomy    . Coronary stent placement    . Breast biopsy Left     Normal  . Bunionectomy Right     Current Outpatient Prescriptions  Medication Sig Dispense Refill  . ascorbic acid (VITAMIN C) 1000 MG tablet Take 1,000 mg by mouth daily.      Marland Kitchen aspirin 81 MG tablet Take 81 mg by mouth daily.      Marland Kitchen atorvastatin (LIPITOR) 20 MG tablet Take 20 mg by mouth daily.      . calcium carbonate (OS-CAL) 600 MG TABS tablet Take 600 mg by mouth 2  (two) times daily with a meal.      . fish oil-omega-3 fatty acids 1000 MG capsule Take 2 g by mouth daily.      . Probiotic Product (HEALTHY COLON PO) Take 1 tablet by mouth daily.      . Thiamine HCl (VITAMIN B-1) 250 MG tablet Take 250 mg by mouth daily.      . cholestyramine (QUESTRAN) 4 GM/DOSE powder Take 0.5 packets (2 g total) by mouth daily.  378 g  12   No current facility-administered medications for this visit.    Allergies as of 11/20/2013 - Review Complete 11/20/2013  Allergen Reaction Noted  . Demeclocycline hcl Hives 04/01/2011  . Demerol Nausea And Vomiting 04/01/2011  . Demerol [meperidine] Nausea And Vomiting 07/23/2013  . Moviprep [peg-kcl-nacl-nasulf-na asc-c]  10/04/2012  . Declomycin [demeclocycline] Rash 07/23/2013    Family History  Problem Relation Age of Onset  . Cancer Father     unknown  . Emphysema Father   . Lupus Child   . Colon cancer Neg Hx   . Heart disease Sister   . Heart disease Paternal Grandmother   . Heart disease Maternal Grandmother   . Hypertension Paternal Grandmother   . Hypertension Maternal Grandmother   . Colon  cancer Father     History   Social History  . Marital Status: Married    Spouse Name: N/A    Number of Children: 89  . Years of Education: N/A   Occupational History  . housework    Social History Main Topics  . Smoking status: Never Smoker   . Smokeless tobacco: Never Used  . Alcohol Use: Yes     Comment: Socially  . Drug Use: No  . Sexual Activity: Yes    Birth Control/ Protection: Surgical   Other Topics Concern  . None   Social History Narrative   ** Merged History Encounter **        Review of Systems: Negative unless mentioned in HPI.   Physical Exam: BP 156/56  Pulse 66  Temp(Src) 97 F (36.1 C) (Oral)  Wt 143 lb 9.6 oz (65.137 kg) General:   Alert and oriented. No distress noted. Pleasant and cooperative.  Head:  Normocephalic and atraumatic. Eyes:  Conjuctiva clear without scleral  icterus. Mouth:  Oral mucosa pink and moist. Good dentition. No lesions. Neck:  Supple, without mass or thyromegaly. Heart:  S1, S2 present without murmurs, rubs, or gallops. Regular rate and rhythm. Abdomen:  +BS, soft, non-tender and non-distended. No rebound or guarding. No HSM or masses noted. Rectal: no external hemorrhoids appreciated. Lax sphincter tone. No obvious mass or stricture. No gross blood on exam.  Vaginal: on right labia majora small cyst-like lesion that appears to be nearing a head. No erythema, edema Msk:  Symmetrical without gross deformities. Normal posture. Extremities:  Without edema. Neurologic:  Alert and  oriented x4;  grossly normal neurologically. Skin:  Intact without significant lesions or rashes. Cervical Nodes:  No significant cervical adenopathy. Psych:  Alert and cooperative. Normal mood and affect.

## 2013-11-21 NOTE — Assessment & Plan Note (Signed)
68 year old with lax sphincter tone and intermittent fecal incontinence. She has not added fiber as requested at last visit. No concerning signs noted, and a colonoscopy is on file from 2012. At this point, recommend trial of fiber daily. I have asked her to give this about 5-7 days; if no improvement, I have already sent low-dose Questran to the pharmacy to trial. She was informed on dosing of Questran and to take other medications at least an hour before or 4 hours after. We will see her back in 4 weeks.

## 2013-11-22 ENCOUNTER — Encounter: Payer: Self-pay | Admitting: Cardiology

## 2013-11-22 DIAGNOSIS — Z79899 Other long term (current) drug therapy: Secondary | ICD-10-CM | POA: Diagnosis not present

## 2013-11-22 DIAGNOSIS — E785 Hyperlipidemia, unspecified: Secondary | ICD-10-CM | POA: Diagnosis not present

## 2013-11-22 DIAGNOSIS — L0390 Cellulitis, unspecified: Secondary | ICD-10-CM | POA: Diagnosis not present

## 2013-11-22 DIAGNOSIS — L0291 Cutaneous abscess, unspecified: Secondary | ICD-10-CM | POA: Diagnosis not present

## 2013-11-22 NOTE — Progress Notes (Signed)
cc'd to pcp 

## 2013-11-26 ENCOUNTER — Ambulatory Visit (INDEPENDENT_AMBULATORY_CARE_PROVIDER_SITE_OTHER): Payer: Medicare Other | Admitting: Cardiology

## 2013-11-26 ENCOUNTER — Encounter: Payer: Self-pay | Admitting: Cardiology

## 2013-11-26 VITALS — BP 154/62 | HR 62 | Ht 61.0 in | Wt 143.3 lb

## 2013-11-26 DIAGNOSIS — I2510 Atherosclerotic heart disease of native coronary artery without angina pectoris: Secondary | ICD-10-CM

## 2013-11-26 DIAGNOSIS — E7800 Pure hypercholesterolemia, unspecified: Secondary | ICD-10-CM

## 2013-11-26 NOTE — Patient Instructions (Signed)
We will schedule you for a stress Echo  Continue your current therapy   I will see you in one year.

## 2013-11-26 NOTE — Progress Notes (Signed)
Ashley [name] Date of Birth: Nov 05, 1946   History of Present Illness: Ashley [name] is seen today for followup. She states she is done very well this year without any significant cardiac symptoms. She remains very active bowling and playing shuffle board. She denies any symptoms of chest pain or shortness of breath. She is status post stenting of the proximal LAD and first obtuse marginal vessels in 2011 with bare-metal stents. She is trying to follow a heart healthy diet.  Current Outpatient Prescriptions on File Prior to Visit  Medication Sig Dispense Refill  . ascorbic acid (VITAMIN C) 1000 MG tablet Take 1,000 mg by mouth daily.      Marland Kitchen aspirin 81 MG tablet Take 81 mg by mouth daily.      Marland Kitchen atorvastatin (LIPITOR) 20 MG tablet Take 20 mg by mouth daily.      . calcium carbonate (OS-CAL) 600 MG TABS tablet Take 600 mg by mouth 2 (two) times daily with a meal.      . fish oil-omega-3 fatty acids 1000 MG capsule Take 2 g by mouth daily.      . Probiotic Product (HEALTHY COLON PO) Take 1 tablet by mouth daily.      . Thiamine HCl (VITAMIN B-1) 250 MG tablet Take 250 mg by mouth daily.       No current facility-administered medications on file prior to visit.    Allergies  Allergen Reactions  . Demeclocycline Hcl Hives  . Demerol Nausea And Vomiting  . Demerol [Meperidine] Nausea And Vomiting  . Moviprep [Peg-Kcl-Nacl-Nasulf-Na Asc-C]   . Declomycin [Demeclocycline] Rash    Past Medical History  Diagnosis Date  . Headache(784.0)   . Herpes zoster   . Hypercholesterolemia   . Coronary artery disease 01/08/10    STATUS POST STENTING OF THE LAD AND THE FIRST OBTUSE MARGINAL VESSEL  . Stomach ulcer   . Hyperlipidemia   . Heart disease   . Phlebitis Right    Leg    Past Surgical History  Procedure Laterality Date  . Cardiac catheterization  01/08/10  . Cardiac catheterization  01/05/10    NORMAL LEFT VENTRICULAR SIZE AND NORMAL SYSTOLIC FUNCTION. EF IS 60%.   . Abdominal  hysterectomy      with bladder tack  . Vein ligation    . Colonoscopy  07/13/2011    RMR:Lax anal sphincter tone otherwise normal/left sided diverticula  . Vaginal hysterectomy    . Coronary stent placement    . Breast biopsy Left     Normal  . Bunionectomy Right     History  Smoking status  . Never Smoker   Smokeless tobacco  . Never Used    History  Alcohol Use  . Yes    Comment: Socially    Family History  Problem Relation Age of Onset  . Cancer Father     unknown  . Emphysema Father   . Lupus Child   . Colon cancer Neg Hx   . Heart disease Sister   . Heart disease Paternal Grandmother   . Heart disease Maternal Grandmother   . Hypertension Paternal Grandmother   . Hypertension Maternal Grandmother   . Colon cancer Father     Review of Systems: As noted in history of present illness. She had a cyst lanced in her groin last week. All other systems were reviewed and are negative.  Physical Exam: BP 154/62  Pulse 62  Ht 5\' 1"  (1.549 m)  Wt 143 lb 4.8  oz (65 kg)  BMI 27.09 kg/m2 She is a pleasant white female who is in no distress. HEENT exam is unremarkable. She has no JVD or bruits. Lungs are clear. Cardiac exam reveals a regular rate and rhythm without gallop or murmur. Abdomen is soft and nontender. She has no edema. Pedal pulses are good. Neurologic exam is nonfocal.  LABORATORY DATA: ECG today is normal.  Assessment / Plan: 1. Coronary disease status post 2 vessel stenting with bare-metal stents 2011. She remains asymptomatic. Continue with aspirin therapy. Continue on statin with Lipitor. We will schedule her for a stress Echo.   2. Hypercholesterolemia. We will continue on 20 mg of Lipitor and encouraged and Mediterranean style diet. Will review results of recent lab work from primary care.

## 2013-12-31 ENCOUNTER — Other Ambulatory Visit (HOSPITAL_COMMUNITY): Payer: Medicare Other

## 2014-01-24 ENCOUNTER — Ambulatory Visit (HOSPITAL_COMMUNITY): Payer: Medicare Other | Attending: Cardiology | Admitting: Radiology

## 2014-01-24 ENCOUNTER — Encounter: Payer: Self-pay | Admitting: Cardiology

## 2014-01-24 ENCOUNTER — Ambulatory Visit (HOSPITAL_BASED_OUTPATIENT_CLINIC_OR_DEPARTMENT_OTHER): Payer: Medicare Other

## 2014-01-24 DIAGNOSIS — I2510 Atherosclerotic heart disease of native coronary artery without angina pectoris: Secondary | ICD-10-CM | POA: Diagnosis not present

## 2014-01-24 DIAGNOSIS — E7800 Pure hypercholesterolemia, unspecified: Secondary | ICD-10-CM | POA: Diagnosis not present

## 2014-01-24 DIAGNOSIS — R0989 Other specified symptoms and signs involving the circulatory and respiratory systems: Secondary | ICD-10-CM

## 2014-01-24 NOTE — Progress Notes (Signed)
Stress Echocardiogram performed.  

## 2014-02-07 ENCOUNTER — Encounter (HOSPITAL_COMMUNITY): Payer: Self-pay | Admitting: Emergency Medicine

## 2014-02-07 ENCOUNTER — Emergency Department (HOSPITAL_COMMUNITY)
Admission: EM | Admit: 2014-02-07 | Discharge: 2014-02-07 | Disposition: A | Discharge status: Home / Self Care | Payer: Medicare Other | Attending: Emergency Medicine | Admitting: Emergency Medicine

## 2014-02-07 ENCOUNTER — Emergency Department (HOSPITAL_COMMUNITY): Payer: Medicare Other

## 2014-02-07 DIAGNOSIS — X500XXA Overexertion from strenuous movement or load, initial encounter: Secondary | ICD-10-CM | POA: Insufficient documentation

## 2014-02-07 DIAGNOSIS — Z872 Personal history of diseases of the skin and subcutaneous tissue: Secondary | ICD-10-CM | POA: Insufficient documentation

## 2014-02-07 DIAGNOSIS — Z79899 Other long term (current) drug therapy: Secondary | ICD-10-CM | POA: Diagnosis not present

## 2014-02-07 DIAGNOSIS — Z9889 Other specified postprocedural states: Secondary | ICD-10-CM | POA: Insufficient documentation

## 2014-02-07 DIAGNOSIS — S93409A Sprain of unspecified ligament of unspecified ankle, initial encounter: Secondary | ICD-10-CM | POA: Diagnosis not present

## 2014-02-07 DIAGNOSIS — Y92838 Other recreation area as the place of occurrence of the external cause: Secondary | ICD-10-CM

## 2014-02-07 DIAGNOSIS — Z7982 Long term (current) use of aspirin: Secondary | ICD-10-CM | POA: Insufficient documentation

## 2014-02-07 DIAGNOSIS — Z9861 Coronary angioplasty status: Secondary | ICD-10-CM | POA: Insufficient documentation

## 2014-02-07 DIAGNOSIS — S99929A Unspecified injury of unspecified foot, initial encounter: Secondary | ICD-10-CM | POA: Diagnosis not present

## 2014-02-07 DIAGNOSIS — M25579 Pain in unspecified ankle and joints of unspecified foot: Secondary | ICD-10-CM | POA: Diagnosis not present

## 2014-02-07 DIAGNOSIS — M79609 Pain in unspecified limb: Secondary | ICD-10-CM | POA: Diagnosis not present

## 2014-02-07 DIAGNOSIS — Z8719 Personal history of other diseases of the digestive system: Secondary | ICD-10-CM | POA: Diagnosis not present

## 2014-02-07 DIAGNOSIS — E785 Hyperlipidemia, unspecified: Secondary | ICD-10-CM | POA: Diagnosis not present

## 2014-02-07 DIAGNOSIS — Z8679 Personal history of other diseases of the circulatory system: Secondary | ICD-10-CM | POA: Diagnosis not present

## 2014-02-07 DIAGNOSIS — S8990XA Unspecified injury of unspecified lower leg, initial encounter: Secondary | ICD-10-CM | POA: Diagnosis not present

## 2014-02-07 DIAGNOSIS — S93609A Unspecified sprain of unspecified foot, initial encounter: Secondary | ICD-10-CM | POA: Insufficient documentation

## 2014-02-07 DIAGNOSIS — I2510 Atherosclerotic heart disease of native coronary artery without angina pectoris: Secondary | ICD-10-CM | POA: Diagnosis not present

## 2014-02-07 DIAGNOSIS — Y9239 Other specified sports and athletic area as the place of occurrence of the external cause: Secondary | ICD-10-CM | POA: Insufficient documentation

## 2014-02-07 DIAGNOSIS — Y939 Activity, unspecified: Secondary | ICD-10-CM | POA: Insufficient documentation

## 2014-02-07 DIAGNOSIS — S93602A Unspecified sprain of left foot, initial encounter: Secondary | ICD-10-CM

## 2014-02-07 HISTORY — DX: Diverticulitis of intestine, part unspecified, without perforation or abscess without bleeding: K57.92

## 2014-02-07 NOTE — Discharge Instructions (Signed)
Foot Sprain The muscles and cord like structures which attach muscle to bone (tendons) that surround the feet are made up of units. A foot sprain can occur at the weakest spot in any of these units. This condition is most often caused by injury to or overuse of the foot, as from playing contact sports, or aggravating a previous injury, or from poor conditioning, or obesity. SYMPTOMS  Pain with movement of the foot.  Tenderness and swelling at the injury site.  Loss of strength is present in moderate or severe sprains. THE THREE GRADES OR SEVERITY OF FOOT SPRAIN ARE:  Mild (Grade I): Slightly pulled muscle without tearing of muscle or tendon fibers or loss of strength.  Moderate (Grade II): Tearing of fibers in a muscle, tendon, or at the attachment to bone, with small decrease in strength.  Severe (Grade III): Rupture of the muscle-tendon-bone attachment, with separation of fibers. Severe sprain requires surgical repair. Often repeating (chronic) sprains are caused by overuse. Sudden (acute) sprains are caused by direct injury or over-use. DIAGNOSIS  Diagnosis of this condition is usually by your own observation. If problems continue, a caregiver may be required for further evaluation and treatment. X-rays may be required to make sure there are not breaks in the bones (fractures) present. Continued problems may require physical therapy for treatment. PREVENTION  Use strength and conditioning exercises appropriate for your sport.  Warm up properly prior to working out.  Use athletic shoes that are made for the sport you are participating in.  Allow adequate time for healing. Early return to activities makes repeat injury more likely, and can lead to an unstable arthritic foot that can result in prolonged disability. Mild sprains generally heal in 3 to 10 days, with moderate and severe sprains taking 2 to 10 weeks. Your caregiver can help you determine the proper time required for  healing. HOME CARE INSTRUCTIONS   Apply ice to the injury for 15-20 minutes, 03-04 times per day. Put the ice in a plastic bag and place a towel between the bag of ice and your skin.  An elastic wrap (like an Ace bandage) may be used to keep swelling down.  Keep foot above the level of the heart, or at least raised on a footstool, when swelling and pain are present.  Try to avoid use other than gentle range of motion while the foot is painful. Do not resume use until instructed by your caregiver. Then begin use gradually, not increasing use to the point of pain. If pain does develop, decrease use and continue the above measures, gradually increasing activities that do not cause discomfort, until you gradually achieve normal use.  Use crutches if and as instructed, and for the length of time instructed.  Keep injured foot and ankle wrapped between treatments.  Massage foot and ankle for comfort and to keep swelling down. Massage from the toes up towards the knee.  Only take over-the-counter or prescription medicines for pain, discomfort, or fever as directed by your caregiver. SEEK IMMEDIATE MEDICAL CARE IF:   Your pain and swelling increase, or pain is not controlled with medications.  You have loss of feeling in your foot or your foot turns cold or blue.  You develop new, unexplained symptoms, or an increase of the symptoms that brought you to your caregiver. MAKE SURE YOU:   Understand these instructions.  Will watch your condition.  Will get help right away if you are not doing well or get worse. Document Released:   04/22/2002 Document Revised: 01/23/2012 Document Reviewed: 06/19/2008 [employer] Patient Information 2014 [employer].  

## 2014-02-07 NOTE — ED Notes (Signed)
Injury to lt foot today in gym

## 2014-02-09 NOTE — ED Provider Notes (Signed)
CSN: [id number]     Arrival date & time 02/07/14  1408 History   First MD Initiated Contact with Patient 02/07/14 1556     Chief Complaint  Patient presents with  . Foot Pain     (Consider location/radiation/quality/duration/timing/severity/associated sxs/prior Treatment) Patient is a 68 y.o. female presenting with lower extremity pain. The history is provided by the patient.  Foot Pain This is a new problem. The current episode started today. The problem occurs constantly. The problem has been unchanged. Associated symptoms include arthralgias. Pertinent negatives include no chills, fever, joint swelling, nausea, neck pain, numbness, rash, vomiting or weakness. The symptoms are aggravated by bending, standing and twisting. She has tried NSAIDs for the symptoms. The treatment provided mild relief.   Patient c/o pain to her left foot and ankle after a twisting injury.  She denies other injuries , numbness or swelling.    Past Medical History  Diagnosis Date  . Headache(784.0)   . Herpes zoster   . Hypercholesterolemia   . Coronary artery disease 01/08/10    STATUS POST STENTING OF THE LAD AND THE FIRST OBTUSE MARGINAL VESSEL  . Stomach ulcer   . Hyperlipidemia   . Heart disease   . Phlebitis Right    Leg  . Diverticulitis    Past Surgical History  Procedure Laterality Date  . Cardiac catheterization  01/08/10  . Cardiac catheterization  01/05/10    NORMAL LEFT VENTRICULAR SIZE AND NORMAL SYSTOLIC FUNCTION. EF IS 60%.   . Abdominal hysterectomy      with bladder tack  . Vein ligation    . Colonoscopy  07/13/2011    RMR:Lax anal sphincter tone otherwise normal/left sided diverticula  . Vaginal hysterectomy    . Coronary stent placement    . Bunionectomy Right   . Breast biopsy Left     Normal   Family History  Problem Relation Age of Onset  . Cancer Father     unknown  . Emphysema Father   . Lupus Child   . Colon cancer Neg Hx   . Heart disease Sister   . Heart disease  Paternal Grandmother   . Heart disease Maternal Grandmother   . Hypertension Paternal Grandmother   . Hypertension Maternal Grandmother   . Colon cancer Father    History  Substance Use Topics  . Smoking status: Never Smoker   . Smokeless tobacco: Never Used  . Alcohol Use: Yes     Comment: Socially   OB History   Grav Para Term Preterm Abortions TAB SAB Ect Mult Living   5 5 5   0     5     Review of Systems  Constitutional: Negative for fever and chills.  Gastrointestinal: Negative for nausea and vomiting.  Genitourinary: Negative for dysuria and difficulty urinating.  Musculoskeletal: Positive for arthralgias. Negative for back pain, joint swelling and neck pain.  Skin: Negative for color change, rash and wound.  Neurological: Negative for weakness and numbness.  All other systems reviewed and are negative.      Allergies  Demerol; Moviprep; and Declomycin  Home Medications   Current Outpatient Rx  Name  Route  Sig  Dispense  Refill  . aspirin EC 81 MG tablet   Oral   Take 81 mg by mouth at bedtime.         Marland Kitchen atorvastatin (LIPITOR) 20 MG tablet   Oral   Take 20 mg by mouth at bedtime.          Marland Kitchen  calcium carbonate (OS-CAL) 600 MG TABS tablet   Oral   Take 600 mg by mouth 2 (two) times daily with a meal.         . Cholecalciferol (VITAMIN D PO)   Oral   Take 1 tablet by mouth every morning.         . Cyanocobalamin (VITAMIN B-12 PO)   Oral   Take 1 tablet by mouth every morning.          . Omega-3 Fatty Acids (FISH OIL) 1200 MG CAPS   Oral   Take 1 capsule by mouth every morning.         . Probiotic Product ([place]) CAPS   Oral   Take 1 capsule by mouth every morning.         . psyllium (METAMUCIL) 58.6 % powder   Oral   Take 1 packet by mouth at bedtime.         Marland Kitchen VITAMIN E PO   Oral   Take 1 tablet by mouth every morning.           BP 143/45  Pulse 68  Temp(Src) 98.3 F (36.8 C) (Oral)  Resp 18  Ht 5'  1" (1.549 m)  Wt 145 lb (65.772 kg)  BMI 27.41 kg/m2  SpO2 97% Physical Exam  Nursing note and vitals reviewed. Constitutional: She is oriented to person, place, and time. She appears well-developed and well-nourished. No distress.  HENT:  Head: Normocephalic and atraumatic.  Cardiovascular: Normal rate, regular rhythm, normal heart sounds and intact distal pulses.   No murmur heard. Pulmonary/Chest: Effort normal and breath sounds normal. No respiratory distress.  Musculoskeletal: She exhibits tenderness. She exhibits no edema.  Left lateral mid foot and ankle is ttp,  Slight bruising.  ROM is preserved.  DP pulse is brisk,distal sensation intact.  No erythema, abrasion, or bony deformity.  No proximal tenderness.  Neurological: She is alert and oriented to person, place, and time. She exhibits normal muscle tone. Coordination normal.  Skin: Skin is warm and dry.    ED Course  Procedures (including critical care time) Labs Review Labs Reviewed - No data to display Imaging Review Dg Ankle Complete Left  02/07/2014   CLINICAL DATA:  Fall and left foot pain.  EXAM: LEFT ANKLE COMPLETE - 3+ VIEW  COMPARISON:  DG FOOT COMPLETE*L* dated 02/07/2014  FINDINGS: The left ankle is located. No evidence for an acute fracture. Alignment of the ankle is normal. Soft tissues are within normal limits.  IMPRESSION: No acute bone abnormality in the left ankle.   Electronically Signed   By: Markus Daft M.D.   On: 02/07/2014 16:03   Dg Foot Complete Left  02/07/2014   CLINICAL DATA:  Fall and foot pain.  EXAM: LEFT FOOT - COMPLETE 3+ VIEW  COMPARISON:  DG ANKLE COMPLETE*L* dated 02/07/2014  FINDINGS: There is a hallux valgus deformity with degenerative changes at the first MTP joint. Negative for an acute fracture. No gross soft tissue abnormality.  IMPRESSION: No acute bone abnormality in the left foot.  Hallux valgus deformity.   Electronically Signed   By: Markus Daft M.D.   On: 02/07/2014 16:05     EKG  Interpretation None      MDM   Final diagnoses:  Sprain of foot, left    XR reviewed neg for fx.  likley sprain.  Pt agrees to RICE therapy and ortho f/u  ASO splint applied, pain improved, remains NV intact.  Antwyne Pingree L. Vanessa , PA-C 02/09/14 2222

## 2014-02-10 NOTE — ED Provider Notes (Signed)
Medical screening examination/treatment/procedure(s) were performed by non-physician practitioner and as supervising physician I was immediately available for consultation/collaboration.   EKG Interpretation None        Charles B. Karle Starch, MD 02/10/14 1316

## 2014-03-14 ENCOUNTER — Other Ambulatory Visit: Payer: Self-pay | Admitting: *Deleted

## 2014-03-14 DIAGNOSIS — Z1231 Encounter for screening mammogram for malignant neoplasm of breast: Secondary | ICD-10-CM

## 2014-04-02 ENCOUNTER — Ambulatory Visit: Payer: Medicare Other

## 2014-04-04 ENCOUNTER — Ambulatory Visit
Admission: RE | Admit: 2014-04-04 | Discharge: 2014-04-04 | Disposition: A | Discharge status: Home / Self Care | Payer: Medicare Other | Source: Ambulatory Visit | Attending: *Deleted | Admitting: *Deleted

## 2014-04-04 DIAGNOSIS — Z1231 Encounter for screening mammogram for malignant neoplasm of breast: Secondary | ICD-10-CM | POA: Diagnosis not present

## 2014-06-12 DIAGNOSIS — L723 Sebaceous cyst: Secondary | ICD-10-CM | POA: Diagnosis not present

## 2014-06-12 DIAGNOSIS — L821 Other seborrheic keratosis: Secondary | ICD-10-CM | POA: Diagnosis not present

## 2014-07-30 DIAGNOSIS — H43819 Vitreous degeneration, unspecified eye: Secondary | ICD-10-CM | POA: Diagnosis not present

## 2014-07-30 DIAGNOSIS — H2589 Other age-related cataract: Secondary | ICD-10-CM | POA: Diagnosis not present

## 2014-07-30 DIAGNOSIS — H02839 Dermatochalasis of unspecified eye, unspecified eyelid: Secondary | ICD-10-CM | POA: Diagnosis not present

## 2014-08-14 DIAGNOSIS — H2510 Age-related nuclear cataract, unspecified eye: Secondary | ICD-10-CM | POA: Diagnosis not present

## 2014-08-14 DIAGNOSIS — H2512 Age-related nuclear cataract, left eye: Secondary | ICD-10-CM | POA: Diagnosis not present

## 2014-08-20 DIAGNOSIS — Z23 Encounter for immunization: Secondary | ICD-10-CM | POA: Diagnosis not present

## 2014-09-15 ENCOUNTER — Encounter (HOSPITAL_COMMUNITY): Payer: Self-pay | Admitting: Emergency Medicine

## 2014-11-26 DIAGNOSIS — E785 Hyperlipidemia, unspecified: Secondary | ICD-10-CM | POA: Diagnosis not present

## 2014-11-26 DIAGNOSIS — Z79899 Other long term (current) drug therapy: Secondary | ICD-10-CM | POA: Diagnosis not present

## 2014-12-02 ENCOUNTER — Encounter: Payer: Self-pay | Admitting: Cardiology

## 2014-12-02 ENCOUNTER — Ambulatory Visit (INDEPENDENT_AMBULATORY_CARE_PROVIDER_SITE_OTHER): Payer: Medicare Other | Admitting: Cardiology

## 2014-12-02 VITALS — BP 150/70 | HR 62 | Ht 61.0 in | Wt 143.1 lb

## 2014-12-02 DIAGNOSIS — E7800 Pure hypercholesterolemia, unspecified: Secondary | ICD-10-CM

## 2014-12-02 DIAGNOSIS — I2510 Atherosclerotic heart disease of native coronary artery without angina pectoris: Secondary | ICD-10-CM | POA: Diagnosis not present

## 2014-12-02 NOTE — Patient Instructions (Addendum)
Continue your current therapy  I will get a copy of your lab work  I will see you in one year

## 2014-12-02 NOTE — Progress Notes (Signed)
[name] Date of Birth: 11-18-1945   History of Present Illness: Mrs. Kath is seen  for followup CAD.  She is status post stenting of the proximal LAD and first obtuse marginal vessels in February 2011 with bare-metal stents. She had a normal stress Echo in 3/15. She denies any chest pain or SOB. She is very active playing pickleball and dancing. 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 She had lab work last week with her primary care.   Current Outpatient Prescriptions on File Prior to Visit  Medication Sig Dispense Refill  . aspirin EC 81 MG tablet Take 81 mg by mouth at bedtime.    Marland Kitchen atorvastatin (LIPITOR) 20 MG tablet Take 20 mg by mouth at bedtime.     . calcium carbonate (OS-CAL) 600 MG TABS tablet Take 600 mg by mouth 2 (two) times daily with a meal.    . Cholecalciferol (VITAMIN D PO) Take 1 tablet by mouth every morning.    . Cyanocobalamin (VITAMIN B-12 PO) Take 1 tablet by mouth every morning.     . Omega-3 Fatty Acids (FISH OIL) 1200 MG CAPS Take 1 capsule by mouth every morning.    . Probiotic Product ([place]) CAPS Take 1 capsule by mouth every morning.    . psyllium (METAMUCIL) 58.6 % powder Take 1 packet by mouth at bedtime.    Marland Kitchen VITAMIN E PO Take 1 tablet by mouth every morning.      No current facility-administered medications on file prior to visit.    Allergies  Allergen Reactions  . Demerol Nausea And Vomiting  . Moviprep [Peg-Kcl-Nacl-Nasulf-Na Asc-C] Nausea And Vomiting  . Declomycin [Demeclocycline] Rash    Past Medical History  Diagnosis Date  . Headache(784.0)   . Herpes zoster   . Hypercholesterolemia   . Coronary artery disease 01/08/10    STATUS POST STENTING OF THE LAD AND THE FIRST OBTUSE MARGINAL VESSEL  . Stomach ulcer   . Hyperlipidemia   . Heart disease   . Phlebitis Right    Leg  . Diverticulitis     Past Surgical History  Procedure Laterality Date  . Cardiac catheterization  01/08/10  . Cardiac  catheterization  01/05/10    NORMAL LEFT VENTRICULAR SIZE AND NORMAL SYSTOLIC FUNCTION. EF IS 60%.   . Abdominal hysterectomy      with bladder tack  . Vein ligation    . Colonoscopy  07/13/2011    RMR:Lax anal sphincter tone otherwise normal/left sided diverticula  . Vaginal hysterectomy    . Coronary stent placement    . Bunionectomy Right   . Breast biopsy Left     Normal  . Cataract extraction      History  Smoking status  . Never Smoker   Smokeless tobacco  . Never Used    History  Alcohol Use  . Yes    Comment: Socially    Family History  Problem Relation Age of Onset  . Cancer Father     unknown  . Emphysema Father   . Lupus Child   . Colon cancer Neg Hx   . Heart disease Sister   . Heart disease Paternal Grandmother   . Heart disease Maternal Grandmother   . Hypertension Paternal Grandmother   . Hypertension Maternal Grandmother   . Colon cancer Father     Review of Systems: As noted in history of present illness.  All other systems were reviewed and are negative.  Physical  Exam: BP 150/70 mmHg  Pulse 62  Ht 5\' 1"  (1.549 m)  Wt 143 lb 1.6 oz (64.91 kg)  BMI 27.05 kg/m2 She is a pleasant white female who is in no distress. HEENT exam is unremarkable. She has no JVD or bruits. Lungs are clear. Cardiac exam reveals a regular rate and rhythm without gallop or murmur. Abdomen is soft and nontender. She has no edema. Pedal pulses are good. Neurologic exam is nonfocal.  LABORATORY DATA: ECG today is normal. I have personally reviewed and interpreted this study.   Assessment / Plan: 1. Coronary disease status post 2 vessel stenting with bare-metal stents 2011. Normal stress Echo in March 2015,  She remains asymptomatic. Continue with aspirin therapy. Continue on statin with Lipitor.   2. Hypercholesterolemia. On 20 mg of Lipitor and encouraged and Mediterranean style diet. Will review results of recent lab work from primary care.

## 2014-12-03 ENCOUNTER — Encounter: Payer: Self-pay | Admitting: Cardiology

## 2014-12-11 DIAGNOSIS — E785 Hyperlipidemia, unspecified: Secondary | ICD-10-CM | POA: Diagnosis not present

## 2014-12-11 DIAGNOSIS — R030 Elevated blood-pressure reading, without diagnosis of hypertension: Secondary | ICD-10-CM | POA: Diagnosis not present

## 2014-12-11 DIAGNOSIS — I2510 Atherosclerotic heart disease of native coronary artery without angina pectoris: Secondary | ICD-10-CM | POA: Diagnosis not present

## 2014-12-24 DIAGNOSIS — Z961 Presence of intraocular lens: Secondary | ICD-10-CM | POA: Diagnosis not present

## 2014-12-24 DIAGNOSIS — H40013 Open angle with borderline findings, low risk, bilateral: Secondary | ICD-10-CM | POA: Diagnosis not present

## 2014-12-24 DIAGNOSIS — H2511 Age-related nuclear cataract, right eye: Secondary | ICD-10-CM | POA: Diagnosis not present

## 2015-02-10 DIAGNOSIS — I10 Essential (primary) hypertension: Secondary | ICD-10-CM | POA: Diagnosis not present

## 2015-02-10 DIAGNOSIS — E785 Hyperlipidemia, unspecified: Secondary | ICD-10-CM | POA: Diagnosis not present

## 2015-02-24 DIAGNOSIS — E785 Hyperlipidemia, unspecified: Secondary | ICD-10-CM | POA: Diagnosis not present

## 2015-02-24 DIAGNOSIS — I10 Essential (primary) hypertension: Secondary | ICD-10-CM | POA: Diagnosis not present

## 2015-04-30 ENCOUNTER — Other Ambulatory Visit (HOSPITAL_COMMUNITY): Payer: Self-pay | Admitting: Family Medicine

## 2015-04-30 DIAGNOSIS — Z1231 Encounter for screening mammogram for malignant neoplasm of breast: Secondary | ICD-10-CM

## 2015-05-06 DIAGNOSIS — I10 Essential (primary) hypertension: Secondary | ICD-10-CM | POA: Diagnosis not present

## 2015-05-07 ENCOUNTER — Ambulatory Visit (HOSPITAL_COMMUNITY)
Admission: RE | Admit: 2015-05-07 | Discharge: 2015-05-07 | Disposition: A | Discharge status: Home / Self Care | Payer: Medicare Other | Source: Ambulatory Visit | Attending: Family Medicine | Admitting: Family Medicine

## 2015-05-07 DIAGNOSIS — Z1231 Encounter for screening mammogram for malignant neoplasm of breast: Secondary | ICD-10-CM | POA: Diagnosis not present

## 2015-07-08 DIAGNOSIS — Z961 Presence of intraocular lens: Secondary | ICD-10-CM | POA: Diagnosis not present

## 2015-07-08 DIAGNOSIS — H40013 Open angle with borderline findings, low risk, bilateral: Secondary | ICD-10-CM | POA: Diagnosis not present

## 2015-07-08 DIAGNOSIS — H2511 Age-related nuclear cataract, right eye: Secondary | ICD-10-CM | POA: Diagnosis not present

## 2015-07-16 DIAGNOSIS — H2511 Age-related nuclear cataract, right eye: Secondary | ICD-10-CM | POA: Diagnosis not present

## 2015-08-04 DIAGNOSIS — I10 Essential (primary) hypertension: Secondary | ICD-10-CM | POA: Diagnosis not present

## 2015-08-04 DIAGNOSIS — S50312A Abrasion of left elbow, initial encounter: Secondary | ICD-10-CM | POA: Diagnosis not present

## 2015-08-05 ENCOUNTER — Other Ambulatory Visit (HOSPITAL_COMMUNITY): Payer: Self-pay | Admitting: Internal Medicine

## 2015-08-05 ENCOUNTER — Other Ambulatory Visit (HOSPITAL_COMMUNITY): Payer: Self-pay | Admitting: Family Medicine

## 2015-08-05 DIAGNOSIS — Z780 Asymptomatic menopausal state: Secondary | ICD-10-CM

## 2015-08-18 ENCOUNTER — Ambulatory Visit (HOSPITAL_COMMUNITY)
Admission: RE | Admit: 2015-08-18 | Discharge: 2015-08-18 | Disposition: A | Discharge status: Home / Self Care | Payer: Medicare Other | Source: Ambulatory Visit | Attending: Family Medicine | Admitting: Family Medicine

## 2015-08-18 DIAGNOSIS — M8580 Other specified disorders of bone density and structure, unspecified site: Secondary | ICD-10-CM | POA: Insufficient documentation

## 2015-08-18 DIAGNOSIS — M85851 Other specified disorders of bone density and structure, right thigh: Secondary | ICD-10-CM | POA: Diagnosis not present

## 2015-08-18 DIAGNOSIS — Z780 Asymptomatic menopausal state: Secondary | ICD-10-CM | POA: Insufficient documentation

## 2015-08-19 DIAGNOSIS — Z23 Encounter for immunization: Secondary | ICD-10-CM | POA: Diagnosis not present

## 2015-08-20 DIAGNOSIS — S7001XA Contusion of right hip, initial encounter: Secondary | ICD-10-CM | POA: Diagnosis not present

## 2015-08-20 DIAGNOSIS — Z6824 Body mass index (BMI) 24.0-24.9, adult: Secondary | ICD-10-CM | POA: Diagnosis not present

## 2015-12-01 DIAGNOSIS — Z6824 Body mass index (BMI) 24.0-24.9, adult: Secondary | ICD-10-CM | POA: Diagnosis not present

## 2015-12-01 DIAGNOSIS — I10 Essential (primary) hypertension: Secondary | ICD-10-CM | POA: Diagnosis not present

## 2015-12-08 ENCOUNTER — Ambulatory Visit (INDEPENDENT_AMBULATORY_CARE_PROVIDER_SITE_OTHER): Payer: Medicare Other | Admitting: Cardiology

## 2015-12-08 ENCOUNTER — Encounter: Payer: Self-pay | Admitting: Cardiology

## 2015-12-08 VITALS — BP 122/62 | HR 82 | Ht 61.0 in | Wt 142.4 lb

## 2015-12-08 DIAGNOSIS — I2510 Atherosclerotic heart disease of native coronary artery without angina pectoris: Secondary | ICD-10-CM | POA: Diagnosis not present

## 2015-12-08 DIAGNOSIS — E7800 Pure hypercholesterolemia, unspecified: Secondary | ICD-10-CM | POA: Diagnosis not present

## 2015-12-08 DIAGNOSIS — I2583 Coronary atherosclerosis due to lipid rich plaque: Principal | ICD-10-CM

## 2015-12-08 NOTE — Progress Notes (Signed)
Ashley [name] Date of Birth: 1946-08-29   History of Present Illness: Ashley [name] is seen  for followup CAD.  She is status post stenting of the proximal LAD and first obtuse marginal vessels in February 2011 with bare-metal stents. She had a normal stress Echo in 3/15.   On follow up today she denies any chest pain or SOB. She is very active playing pickleball and dancing. She won a Engineer, water in [place]. Reports recent labs with primary care looked good. No edema, palpitations, PND, or dizziness. Has some temporal HA but states this is not unusual for her in the winter.   Current Outpatient Prescriptions on File Prior to Visit  Medication Sig Dispense Refill  . aspirin EC 81 MG tablet Take 81 mg by mouth at bedtime.    Marland Kitchen atorvastatin (LIPITOR) 20 MG tablet Take 20 mg by mouth at bedtime.     . calcium carbonate (OS-CAL) 600 MG TABS tablet Take 600 mg by mouth 2 (two) times daily with a meal.    . Cholecalciferol (VITAMIN D PO) Take 1 tablet by mouth every morning.    . Cyanocobalamin (VITAMIN B-12 PO) Take 1 tablet by mouth every morning.     . Omega-3 Fatty Acids (FISH OIL) 1200 MG CAPS Take 1 capsule by mouth every morning.    . Probiotic Product ([place]) CAPS Take 1 capsule by mouth every morning.    . psyllium (METAMUCIL) 58.6 % powder Take 1 packet by mouth at bedtime.    Marland Kitchen VITAMIN E PO Take 1 tablet by mouth every morning.      No current facility-administered medications on file prior to visit.    Allergies  Allergen Reactions  . Demerol Nausea And Vomiting  . Moviprep [Peg-Kcl-Nacl-Nasulf-Na Asc-C] Nausea And Vomiting  . Declomycin [Demeclocycline] Rash    Past Medical History  Diagnosis Date  . Headache(784.0)   . Herpes zoster   . Hypercholesterolemia   . Coronary artery disease 01/08/10    STATUS POST STENTING OF THE LAD AND THE FIRST OBTUSE MARGINAL VESSEL  . Stomach ulcer   . Hyperlipidemia   . Heart disease   . Phlebitis Right   Leg  . Diverticulitis     Past Surgical History  Procedure Laterality Date  . Cardiac catheterization  01/08/10  . Cardiac catheterization  01/05/10    NORMAL LEFT VENTRICULAR SIZE AND NORMAL SYSTOLIC FUNCTION. EF IS 60%.   . Abdominal hysterectomy      with bladder tack  . Vein ligation    . Colonoscopy  07/13/2011    RMR:Lax anal sphincter tone otherwise normal/left sided diverticula  . Vaginal hysterectomy    . Coronary stent placement    . Bunionectomy Right   . Breast biopsy Left     Normal  . Cataract extraction      History  Smoking status  . Never Smoker   Smokeless tobacco  . Never Used    History  Alcohol Use  . Yes    Comment: Socially    Family History  Problem Relation Age of Onset  . Cancer Father     unknown  . Emphysema Father   . Lupus Child   . Colon cancer Neg Hx   . Heart disease Sister   . Heart disease Paternal Grandmother   . Heart disease Maternal Grandmother   . Hypertension Paternal Grandmother   . Hypertension Maternal Grandmother   . Colon cancer Father     Review  of Systems: As noted in history of present illness.  All other systems were reviewed and are negative.  Physical Exam: BP 122/62 mmHg  Pulse 82  Ht 5\' 1"  (1.549 m)  Wt 64.609 kg (142 lb 7 oz)  BMI 26.93 kg/m2 She is a pleasant white female who is in no distress. HEENT exam is unremarkable. She has no JVD or bruits. Lungs are clear. Cardiac exam reveals a regular rate and rhythm without gallop or murmur. Abdomen is soft and nontender. She has no edema. Pedal pulses are good. Neurologic exam is nonfocal.  LABORATORY DATA: ECG today is normal. Rate 82.  I have personally reviewed and interpreted this study.   Assessment / Plan: 1. Coronary disease status post 2 vessel stenting with bare-metal stents 2011. Normal stress Echo in March 2015,  She remains asymptomatic. Continue with aspirin therapy. Continue on statin with Lipitor.   2. Hypercholesterolemia. On 20 mg of  Lipitor and encouraged and Mediterranean style diet. Will review results of recent lab work from primary care.

## 2015-12-08 NOTE — Patient Instructions (Signed)
Continue your current therapy    We will get a copy of your lab work  I will see you in one year.

## 2016-03-29 ENCOUNTER — Other Ambulatory Visit (HOSPITAL_COMMUNITY): Payer: Self-pay | Admitting: Family Medicine

## 2016-03-29 DIAGNOSIS — Z1231 Encounter for screening mammogram for malignant neoplasm of breast: Secondary | ICD-10-CM

## 2016-03-30 DIAGNOSIS — H01024 Squamous blepharitis left upper eyelid: Secondary | ICD-10-CM | POA: Diagnosis not present

## 2016-03-30 DIAGNOSIS — H43813 Vitreous degeneration, bilateral: Secondary | ICD-10-CM | POA: Diagnosis not present

## 2016-03-30 DIAGNOSIS — H01022 Squamous blepharitis right lower eyelid: Secondary | ICD-10-CM | POA: Diagnosis not present

## 2016-03-30 DIAGNOSIS — Z961 Presence of intraocular lens: Secondary | ICD-10-CM | POA: Diagnosis not present

## 2016-03-30 DIAGNOSIS — H01021 Squamous blepharitis right upper eyelid: Secondary | ICD-10-CM | POA: Diagnosis not present

## 2016-03-30 DIAGNOSIS — I10 Essential (primary) hypertension: Secondary | ICD-10-CM | POA: Diagnosis not present

## 2016-03-30 DIAGNOSIS — H40013 Open angle with borderline findings, low risk, bilateral: Secondary | ICD-10-CM | POA: Diagnosis not present

## 2016-03-30 DIAGNOSIS — H01025 Squamous blepharitis left lower eyelid: Secondary | ICD-10-CM | POA: Diagnosis not present

## 2016-03-30 DIAGNOSIS — K5700 Diverticulitis of small intestine with perforation and abscess without bleeding: Secondary | ICD-10-CM | POA: Diagnosis not present

## 2016-03-30 DIAGNOSIS — Z6824 Body mass index (BMI) 24.0-24.9, adult: Secondary | ICD-10-CM | POA: Diagnosis not present

## 2016-04-06 DIAGNOSIS — N390 Urinary tract infection, site not specified: Secondary | ICD-10-CM | POA: Diagnosis not present

## 2016-04-06 DIAGNOSIS — R509 Fever, unspecified: Secondary | ICD-10-CM | POA: Diagnosis not present

## 2016-04-06 DIAGNOSIS — R5383 Other fatigue: Secondary | ICD-10-CM | POA: Diagnosis not present

## 2016-05-12 ENCOUNTER — Ambulatory Visit (HOSPITAL_COMMUNITY)
Admission: RE | Admit: 2016-05-12 | Discharge: 2016-05-12 | Disposition: A | Discharge status: Home / Self Care | Payer: Medicare Other | Source: Ambulatory Visit | Attending: Family Medicine | Admitting: Family Medicine

## 2016-05-12 DIAGNOSIS — Z1231 Encounter for screening mammogram for malignant neoplasm of breast: Secondary | ICD-10-CM | POA: Diagnosis not present

## 2016-05-31 DIAGNOSIS — J0100 Acute maxillary sinusitis, unspecified: Secondary | ICD-10-CM | POA: Diagnosis not present

## 2016-06-17 ENCOUNTER — Encounter: Payer: Self-pay | Admitting: Internal Medicine

## 2016-08-18 DIAGNOSIS — Z23 Encounter for immunization: Secondary | ICD-10-CM | POA: Diagnosis not present

## 2016-09-01 ENCOUNTER — Ambulatory Visit (INDEPENDENT_AMBULATORY_CARE_PROVIDER_SITE_OTHER): Payer: Medicare Other | Admitting: Gastroenterology

## 2016-09-01 ENCOUNTER — Encounter: Payer: Self-pay | Admitting: Gastroenterology

## 2016-09-01 VITALS — BP 127/71 | HR 72 | Temp 97.2°F | Ht 61.0 in | Wt 141.0 lb

## 2016-09-01 DIAGNOSIS — R151 Fecal smearing: Secondary | ICD-10-CM | POA: Diagnosis not present

## 2016-09-01 DIAGNOSIS — I2583 Coronary atherosclerosis due to lipid rich plaque: Secondary | ICD-10-CM | POA: Diagnosis not present

## 2016-09-01 DIAGNOSIS — K625 Hemorrhage of anus and rectum: Secondary | ICD-10-CM | POA: Diagnosis not present

## 2016-09-01 DIAGNOSIS — Z860100 Personal history of colon polyps, unspecified: Secondary | ICD-10-CM

## 2016-09-01 DIAGNOSIS — I2510 Atherosclerotic heart disease of native coronary artery without angina pectoris: Secondary | ICD-10-CM | POA: Diagnosis not present

## 2016-09-01 DIAGNOSIS — Z800 Family history of malignant neoplasm of digestive organs: Secondary | ICD-10-CM

## 2016-09-01 HISTORY — DX: Hemorrhage of anus and rectum: K62.5

## 2016-09-01 HISTORY — DX: Personal history of colon polyps, unspecified: Z86.0100

## 2016-09-01 NOTE — Progress Notes (Signed)
Please schedule colonoscopy with Dr. Gala Romney.  Use Prepopik, provide sample if necessary.

## 2016-09-01 NOTE — Patient Instructions (Addendum)
Colonoscopy with Dr. Gala Romney as scheduled.  Would consider Colestid after colonoscopy to assist with fecal incontinence. Patient never tried Questran and is worried about taste.

## 2016-09-01 NOTE — Progress Notes (Signed)
Primary Care Physician:  Maggie Font, MD  Primary Gastroenterologist:  Garfield Cornea, MD   Chief Complaint  Patient presents with  . Rectal Bleeding    HPI:  Ashley [name] is a 70 y.o. female here to schedule colonoscopy. She complains of rectal bleeding and fecal incontinence. She was last seen in January 2015 for fecal incontinence and hemorrhoids. Last colonoscopy in August 2012 with redundant colon, left-sided diverticula, small polyp at the splenic flexure ("too small to be processed" therefore next colonoscopy recommended August 2017), lax sphincter tone. She has a history of lax anal sphincter tone, left-sided diverticular on prior colonoscopy. Previously recommended fiber and low-dose Questran for management of fecal incontinence.   Only once a week has stool without continence. Generic metamucil, [facility]. Never took [place]. First episode of rectal bleeding a few weeks ago. Described as moderate amount. No rectal pain. Patient still complaining of passing stool when urinating. When she has BM, can never complete. Stool just keeps coming. Has to wipe "forever". No n/v, heartburn, dysphagia.   She is worried about the bowel prep because she has had issues vomiting every time.    Current Outpatient Prescriptions  Medication Sig Dispense Refill  . aspirin EC 81 MG tablet Take 81 mg by mouth at bedtime.    Marland Kitchen atorvastatin (LIPITOR) 20 MG tablet Take 20 mg by mouth at bedtime.     . calcium carbonate (OS-CAL) 600 MG TABS tablet Take 600 mg by mouth 2 (two) times daily with a meal.    . Cholecalciferol (VITAMIN D PO) Take 1 tablet by mouth every morning.    . Cyanocobalamin (VITAMIN B-12 PO) Take 1 tablet by mouth every morning.     . furosemide (LASIX) 20 MG tablet Take 20 mg by mouth daily.    Marland Kitchen lisinopril (PRINIVIL,ZESTRIL) 5 MG tablet Take 1 tablet by mouth daily. Take 1 tab daily  0  . Omega-3 Fatty Acids (FISH OIL) 1200 MG CAPS Take 1 capsule by mouth every morning.     . Probiotic Product ([place]) CAPS Take 1 capsule by mouth every morning.    . psyllium (METAMUCIL) 58.6 % powder Take 1 packet by mouth at bedtime.    Marland Kitchen VITAMIN E PO Take 1 tablet by mouth every morning.      No current facility-administered medications for this visit.     Allergies as of 09/01/2016 - Review Complete 09/01/2016  Allergen Reaction Noted  . Demerol Nausea And Vomiting 04/01/2011  . Moviprep [peg-kcl-nacl-nasulf-na asc-c] Nausea And Vomiting 10/04/2012  . Declomycin [demeclocycline] Rash 07/23/2013    Past Medical History:  Diagnosis Date  . Coronary artery disease 01/08/10   STATUS POST STENTING OF THE LAD AND THE FIRST OBTUSE MARGINAL VESSEL  . Diverticulitis   . Headache(784.0)   . Heart disease   . Herpes zoster   . Hypercholesterolemia   . Hyperlipidemia   . Phlebitis Right   Leg  . Stomach ulcer     Past Surgical History:  Procedure Laterality Date  . ABDOMINAL HYSTERECTOMY     with bladder tack  . BREAST BIOPSY Left    Normal  . Bunionectomy Right   . CARDIAC CATHETERIZATION  01/08/10  . CARDIAC CATHETERIZATION  01/05/10   NORMAL LEFT VENTRICULAR SIZE AND NORMAL SYSTOLIC FUNCTION. EF IS 60%.   Marland Kitchen CATARACT EXTRACTION    . COLONOSCOPY  07/13/2011   RMR:Lax anal sphincter tone otherwise normal/left sided diverticula, polyp removed but not analyzed  .  CORONARY STENT PLACEMENT    . VAGINAL HYSTERECTOMY    . VEIN LIGATION      Family History  Problem Relation Age of Onset  . Emphysema Father   . Colon cancer Father   . Lupus Child   . Heart disease Sister   . Heart disease Paternal Grandmother   . Hypertension Paternal Grandmother   . Heart disease Maternal Grandmother   . Hypertension Maternal Grandmother     Social History   Social History  . Marital status: Married    Spouse name: N/A  . Number of children: 5  . Years of education: N/A   Occupational History  . housework    Social History Main Topics  . Smoking  status: Never Smoker  . Smokeless tobacco: Never Used  . Alcohol use Yes     Comment: Socially  . Drug use: No  . Sexual activity: Yes    Birth control/ protection: Surgical   Other Topics Concern  . Not on file   Social History Narrative   ** Merged History Encounter **          ROS:  General: Negative for anorexia, weight loss, fever, chills, fatigue, weakness. Eyes: Negative for vision changes.  ENT: Negative for hoarseness, difficulty swallowing , nasal congestion. CV: Negative for chest pain, angina, palpitations, dyspnea on exertion, peripheral edema.  Respiratory: Negative for dyspnea at rest, dyspnea on exertion, cough, sputum, wheezing.  GI: See history of present illness. GU:  Negative for dysuria, hematuria, urinary incontinence, urinary frequency, nocturnal urination.  MS: Negative for joint pain, low back pain.  Derm: Negative for rash or itching.  Neuro: Negative for weakness, abnormal sensation, seizure, frequent headaches, memory loss, confusion.  Psych: Negative for anxiety, depression, suicidal ideation, hallucinations.  Endo: Negative for unusual weight change.  Heme: Negative for bruising or bleeding. Allergy: Negative for rash or hives.    Physical Examination:  BP 127/71   Pulse 72   Temp 97.2 F (36.2 C) (Oral)   Ht 5\' 1"  (1.549 m)   Wt 141 lb (64 kg)   BMI 26.64 kg/m    General: Well-nourished, well-developed in no acute distress.  Head: Normocephalic, atraumatic.   Eyes: Conjunctiva pink, no icterus. Mouth: Oropharyngeal mucosa moist and pink , no lesions erythema or exudate. Neck: Supple without thyromegaly, masses, or lymphadenopathy.  Lungs: Clear to auscultation bilaterally.  Heart: Regular rate and rhythm, no murmurs rubs or gallops.  Abdomen: Bowel sounds are normal, nontender, nondistended, no hepatosplenomegaly or masses, no abdominal bruits or    hernia , no rebound or guarding.   Rectal: not performed Extremities: No lower  extremity edema. No clubbing or deformities.  Neuro: Alert and oriented x 4 , grossly normal neurologically.  Skin: Warm and dry, no rash or jaundice.   Psych: Alert and cooperative, normal mood and affect.  Imaging Studies: No results found.

## 2016-09-01 NOTE — Assessment & Plan Note (Addendum)
70 y/o female with lax sphincter tone and intermittent fecal smearing/incontinence, rectal bleeding. Personal h/o colon polyp not analyzed. FH of [place] father. Due for colonoscopy for surveillance purposes. Colonoscopy in near future.  I have discussed the risks, alternatives, benefits with regards to but not limited to the risk of reaction to medication, bleeding, infection, perforation and the patient is agreeable to proceed. Written consent to be obtained.  After colonoscopy, would advise questran or Colestid to try to cut back on fecal smearing/incontinence. Discussed again with patient reasoning behind use of the medication. Previously she never got the RX filled. She voiced understanding.   She has vomiting with bowel preps previously. Try short volume bowel prep such as Prepopik.

## 2016-09-02 ENCOUNTER — Other Ambulatory Visit: Payer: Self-pay

## 2016-09-02 DIAGNOSIS — Z800 Family history of malignant neoplasm of digestive organs: Secondary | ICD-10-CM

## 2016-09-02 DIAGNOSIS — Z86010 Personal history of colonic polyps: Secondary | ICD-10-CM

## 2016-09-02 DIAGNOSIS — K625 Hemorrhage of anus and rectum: Secondary | ICD-10-CM

## 2016-09-02 NOTE — Progress Notes (Signed)
cc'ed to pcp °

## 2016-09-02 NOTE — Progress Notes (Signed)
Called pt and scheduled TCS with RMR 09/21/16 at 1:00 pm. Pt to pick-up Suprep sample with instructions (Prepopik not available).

## 2016-09-21 ENCOUNTER — Ambulatory Visit (HOSPITAL_COMMUNITY)
Admission: RE | Admit: 2016-09-21 | Discharge: 2016-09-21 | Disposition: A | Discharge status: Home / Self Care | Payer: Medicare Other | Source: Ambulatory Visit | Attending: Internal Medicine | Admitting: Internal Medicine

## 2016-09-21 ENCOUNTER — Encounter (HOSPITAL_COMMUNITY)
Admission: RE | Disposition: A | Discharge status: Home / Self Care | Payer: Self-pay | Source: Ambulatory Visit | Attending: Internal Medicine

## 2016-09-21 ENCOUNTER — Encounter (HOSPITAL_COMMUNITY): Payer: Self-pay

## 2016-09-21 DIAGNOSIS — Z1211 Encounter for screening for malignant neoplasm of colon: Secondary | ICD-10-CM | POA: Diagnosis not present

## 2016-09-21 DIAGNOSIS — Z955 Presence of coronary angioplasty implant and graft: Secondary | ICD-10-CM | POA: Insufficient documentation

## 2016-09-21 DIAGNOSIS — Z86010 Personal history of colonic polyps: Secondary | ICD-10-CM | POA: Insufficient documentation

## 2016-09-21 DIAGNOSIS — Z7982 Long term (current) use of aspirin: Secondary | ICD-10-CM | POA: Insufficient documentation

## 2016-09-21 DIAGNOSIS — E7800 Pure hypercholesterolemia, unspecified: Secondary | ICD-10-CM | POA: Insufficient documentation

## 2016-09-21 DIAGNOSIS — K625 Hemorrhage of anus and rectum: Secondary | ICD-10-CM

## 2016-09-21 DIAGNOSIS — Z79899 Other long term (current) drug therapy: Secondary | ICD-10-CM | POA: Diagnosis not present

## 2016-09-21 DIAGNOSIS — K5730 Diverticulosis of large intestine without perforation or abscess without bleeding: Secondary | ICD-10-CM | POA: Diagnosis not present

## 2016-09-21 DIAGNOSIS — K640 First degree hemorrhoids: Secondary | ICD-10-CM | POA: Diagnosis not present

## 2016-09-21 DIAGNOSIS — I2510 Atherosclerotic heart disease of native coronary artery without angina pectoris: Secondary | ICD-10-CM | POA: Insufficient documentation

## 2016-09-21 DIAGNOSIS — Z800 Family history of malignant neoplasm of digestive organs: Secondary | ICD-10-CM

## 2016-09-21 HISTORY — DX: Other specified postprocedural states: Z98.890

## 2016-09-21 HISTORY — DX: Nausea with vomiting, unspecified: R11.2

## 2016-09-21 HISTORY — PX: COLONOSCOPY: SHX5424

## 2016-09-21 SURGERY — COLONOSCOPY
Anesthesia: Moderate Sedation

## 2016-09-21 MED ORDER — FENTANYL CITRATE (PF) 100 MCG/2ML IJ SOLN
INTRAMUSCULAR | Status: DC | PRN
  Administered 2016-09-21: 50 ug via INTRAVENOUS
  Administered 2016-09-21 (×2): 25 ug via INTRAVENOUS

## 2016-09-21 MED ORDER — MIDAZOLAM HCL 5 MG/5ML IJ SOLN
INTRAMUSCULAR | Status: AC
  Filled 2016-09-21: qty 10

## 2016-09-21 MED ORDER — STERILE WATER FOR IRRIGATION IR SOLN
Status: DC | PRN
  Administered 2016-09-21: 13:00:00

## 2016-09-21 MED ORDER — FENTANYL CITRATE (PF) 100 MCG/2ML IJ SOLN
INTRAMUSCULAR | Status: AC
  Filled 2016-09-21: qty 4

## 2016-09-21 MED ORDER — ONDANSETRON HCL 4 MG/2ML IJ SOLN
INTRAMUSCULAR | Status: AC
  Filled 2016-09-21: qty 2

## 2016-09-21 MED ORDER — MIDAZOLAM HCL 5 MG/5ML IJ SOLN
INTRAMUSCULAR | Status: DC | PRN
  Administered 2016-09-21: 2 mg via INTRAVENOUS
  Administered 2016-09-21 (×2): 1 mg via INTRAVENOUS

## 2016-09-21 MED ORDER — ONDANSETRON HCL 4 MG/2ML IJ SOLN
INTRAMUSCULAR | Status: DC | PRN
  Administered 2016-09-21: 4 mg via INTRAVENOUS

## 2016-09-21 MED ORDER — SODIUM CHLORIDE 0.9 % IV SOLN
INTRAVENOUS | Status: DC
  Administered 2016-09-21: 13:00:00 via INTRAVENOUS

## 2016-09-21 NOTE — Op Note (Signed)
[facility] Patient Name: Ashley [name] Procedure Date: 09/21/2016 12:55 PM MRN: [id number] Date of Birth: [birth year] Attending MD: Norvel Richards , MD CSN: [id number] Age: 70 Admit Type: Outpatient Procedure:                Colonoscopy Indications:              High risk colon cancer surveillance: Personal                            history of colonic polyps Providers:                Norvel Richards, MD, Otis Peak B. Gwenlyn Perking RN, RN,                            Isabella Stalling, Technician Referring MD:              Medicines:                Midazolam 4 mg IV, Meperidine 100 mg IV,                            Ondansetron 4 mg IV Complications:            No immediate complications. Estimated Blood Loss:     Estimated blood loss: none. Procedure:                Pre-Anesthesia Assessment:                           - Prior to the procedure, a History and Physical                            was performed, and patient medications and                            allergies were reviewed. The patient's tolerance of                            previous anesthesia was also reviewed. The risks                            and benefits of the procedure and the sedation                            options and risks were discussed with the patient.                            All questions were answered, and informed consent                            was obtained. Prior Anticoagulants: The patient has                            taken no previous anticoagulant or antiplatelet  agents. ASA Grade Assessment: II - A patient with                            mild systemic disease. After reviewing the risks                            and benefits, the patient was deemed in                            satisfactory condition to undergo the procedure.                           After obtaining informed consent, the colonoscope                            was passed under direct  vision. Throughout the                            procedure, the patient's blood pressure, pulse, and                            oxygen saturations were monitored continuously. The                            [id number] scope was introduced through                            the anus and advanced to the the cecum, identified                            by appendiceal orifice and ileocecal valve. The                            colonoscopy was performed without difficulty. The                            patient tolerated the procedure well. The quality                            of the bowel preparation was adequate. The                            ileocecal valve, appendiceal orifice, and rectum                            were photographed. The entire colon was well                            visualized. Scope In: 1:04:26 PM Scope Out: [id number] PM Scope Withdrawal Time: 0 hours 6 minutes 44 seconds  Total Procedure Duration: 0 hours 11 minutes 53 seconds  Findings:      The perianal and digital rectal examinations were normal. lax sphincter       tone      Internal  hemorrhoids were found during retroflexion. The hemorrhoids       were Grade I (internal hemorrhoids that do not prolapse).      Scattered small and large-mouthed diverticula were found in the sigmoid       colon.      The exam was otherwise without abnormality on direct and retroflexion       views. Impression:               - Internal hemorrhoids.                           - Diverticulosis in the sigmoid colon.                           - The examination was otherwise normal on direct                            and retroflexion views.                           - No specimens collected. Moderate Sedation:      Moderate (conscious) sedation was administered by the endoscopy nurse       and supervised by the endoscopist. The following parameters were       monitored: oxygen saturation, heart rate, blood pressure,  respiratory       rate, EKG, adequacy of pulmonary ventilation, and response to care.       Total physician intraservice time was 14 minutes. Recommendation:           - Patient has a contact number available for                            emergencies. The signs and symptoms of potential                            delayed complications were discussed with the                            patient. Return to normal activities tomorrow.                            Written discharge instructions were provided to the                            patient.                           - Resume previous diet.                           - Continue present medications. Increase fiber                            dosing to twice daily. Kegal exercises.                           - Repeat colonoscopy in 5 years for surveillance  only of overall health permits. May also try                            Questran?"off label as previously prescribed for                            bouts of fecal seepage and hygiene issues. Scant                            rectal bleeding likely secondary to hemorrhoids and                            may respond to a hemorrhoid banding if needed in                            the future.                           - Return to GI office in 6 weeks. Procedure Code(s):        --- Professional ---                           [phone], Colonoscopy, flexible; diagnostic, including                            collection of specimen(s) by brushing or washing,                            when performed (separate procedure)                           99152, Moderate sedation services provided by the                            same physician or other qualified health care                            professional performing the diagnostic or                            therapeutic service that the sedation supports,                            requiring the presence of an independent  trained                            observer to assist in the monitoring of the                            patient's level of consciousness and physiological                            status; initial 15 minutes of intraservice time,  patient age 62 years or older Diagnosis Code(s):        --- Professional ---                           Z86.010, Personal history of colonic polyps                           K64.0, First degree hemorrhoids                           K57.30, Diverticulosis of large intestine without                            perforation or abscess without bleeding CPT copyright 2016 American Medical Association. All rights reserved. The codes documented in this report are preliminary and upon coder review may  be revised to meet current compliance requirements. Cristopher Estimable. Stephaie Dardis, MD Norvel Richards, MD 09/21/2016 1:26:37 PM This report has been signed electronically. Number of Addenda: 0

## 2016-09-21 NOTE — Discharge Instructions (Addendum)
Colonoscopy Discharge Instructions  Read the instructions outlined below and refer to this sheet in the next few weeks. These discharge instructions provide you with general information on caring for yourself after you leave the hospital. Your doctor may also give you specific instructions. While your treatment has been planned according to the most current medical practices available, unavoidable complications occasionally occur. If you have any problems or questions after discharge, call Dr. Gala Romney at [phone]. ACTIVITY  You may resume your regular activity, but move at a slower pace for the next 24 hours.   Take frequent rest periods for the next 24 hours.   Walking will help get rid of the air and reduce the bloated feeling in your belly (abdomen).   No driving for 24 hours (because of the medicine (anesthesia) used during the test).    Do not sign any important legal documents or operate any machinery for 24 hours (because of the anesthesia used during the test).  NUTRITION  Drink plenty of fluids.   You may resume your normal diet as instructed by your doctor.   Begin with a light meal and progress to your normal diet. Heavy or fried foods are harder to digest and may make you feel sick to your stomach (nauseated).   Avoid alcoholic beverages for 24 hours or as instructed.  MEDICATIONS  You may resume your normal medications unless your doctor tells you otherwise.  WHAT YOU CAN EXPECT TODAY  Some feelings of bloating in the abdomen.   Passage of more gas than usual.   Spotting of blood in your stool or on the toilet paper.  IF YOU HAD POLYPS REMOVED DURING THE COLONOSCOPY:  No aspirin products for 7 days or as instructed.   No alcohol for 7 days or as instructed.   Eat a soft diet for the next 24 hours.  FINDING OUT THE RESULTS OF YOUR TEST Not all test results are available during your visit. If your test results are not back during the visit, make an appointment  with your caregiver to find out the results. Do not assume everything is normal if you have not heard from your caregiver or the medical facility. It is important for you to follow up on all of your test results.  SEEK IMMEDIATE MEDICAL ATTENTION IF:  You have more than a spotting of blood in your stool.   Your belly is swollen (abdominal distention).   You are nauseated or vomiting.   You have a temperature over 101.   You have abdominal pain or discomfort that is severe or gets worse throughout the day.     Colon diverticulosis information provided  Informational Kegal exercises provided  Increase fiber dosing to twice daily  Consider taking Questran as directed (off label use) to diminish fecal seepage  Office visit with [place] in 6 weeks  Recommend 1 more screening colonoscopy in 5 years only of overall health permits.   Diverticulosis Diverticulosis is the condition that develops when small pouches (diverticula) form in the wall of your colon. Your colon, or large intestine, is where water is absorbed and stool is formed. The pouches form when the inside layer of your colon pushes through weak spots in the outer layers of your colon. CAUSES  No one knows exactly what causes diverticulosis. RISK FACTORS  Being older than 55. Your risk for this condition increases with age. Diverticulosis is rare in people younger than 40 years. By age 58, almost everyone has it.  Eating a  low-fiber diet.  Being frequently constipated.  Being overweight.  Not getting enough exercise.  Smoking.  Taking over-the-counter pain medicines, like aspirin and ibuprofen. SYMPTOMS  Most people with diverticulosis do not have symptoms. DIAGNOSIS  Because diverticulosis often has no symptoms, health care providers often discover the condition during an exam for other colon problems. In many cases, a health care provider will diagnose diverticulosis while using a flexible scope to examine the colon  (colonoscopy). TREATMENT  If you have never developed an infection related to diverticulosis, you may not need treatment. If you have had an infection before, treatment may include:  Eating more fruits, vegetables, and grains.  Taking a fiber supplement.  Taking a live bacteria supplement (probiotic).  Taking medicine to relax your colon. HOME CARE INSTRUCTIONS   Drink at least 6-8 glasses of water each day to prevent constipation.  Try not to strain when you have a bowel movement.  Keep all follow-up appointments. If you have had an infection before:  Increase the fiber in your diet as directed by your health care provider or dietitian.  Take a dietary fiber supplement if your health care provider approves.  Only take medicines as directed by your health care provider. SEEK MEDICAL CARE IF:   You have abdominal pain.  You have bloating.  You have cramps.  You have not gone to the bathroom in 3 days. SEEK IMMEDIATE MEDICAL CARE IF:   Your pain gets worse.  Yourbloating becomes very bad.  You have a fever or chills, and your symptoms suddenly get worse.  You begin vomiting.  You have bowel movements that are bloody or black. MAKE SURE YOU:  Understand these instructions.  Will watch your condition.  Will get help right away if you are not doing well or get worse.   This information is not intended to replace advice given to you by your health care provider. Make sure you discuss any questions you have with your health care provider.   Document Released: 07/28/2004 Document Revised: 11/05/2013 Document Reviewed: 09/25/2013 Elsevier Interactive Patient Education [employer].

## 2016-09-21 NOTE — H&P (View-Only) (Signed)
Primary Care Physician:  Maggie Font, MD  Primary Gastroenterologist:  Garfield Cornea, MD   Chief Complaint  Patient presents with  . Rectal Bleeding    HPI:  Ashley [name] is a 70 y.o. female here to schedule colonoscopy. She complains of rectal bleeding and fecal incontinence. She was last seen in January 2015 for fecal incontinence and hemorrhoids. Last colonoscopy in August 2012 with redundant colon, left-sided diverticula, small polyp at the splenic flexure ("too small to be processed" therefore next colonoscopy recommended August 2017), lax sphincter tone. She has a history of lax anal sphincter tone, left-sided diverticular on prior colonoscopy. Previously recommended fiber and low-dose Questran for management of fecal incontinence.   Only once a week has stool without continence. Generic metamucil, [facility] colon [facility]. Never took [place]. First episode of rectal bleeding a few weeks ago. Described as moderate amount. No rectal pain. Patient still complaining of passing stool when urinating. When she has BM, can never complete. Stool just keeps coming. Has to wipe "forever". No n/v, heartburn, dysphagia.   She is worried about the bowel prep because she has had issues vomiting every time.    Current Outpatient Prescriptions  Medication Sig Dispense Refill  . aspirin EC 81 MG tablet Take 81 mg by mouth at bedtime.    Marland Kitchen atorvastatin (LIPITOR) 20 MG tablet Take 20 mg by mouth at bedtime.     . calcium carbonate (OS-CAL) 600 MG TABS tablet Take 600 mg by mouth 2 (two) times daily with a meal.    . Cholecalciferol (VITAMIN D PO) Take 1 tablet by mouth every morning.    . Cyanocobalamin (VITAMIN B-12 PO) Take 1 tablet by mouth every morning.     . furosemide (LASIX) 20 MG tablet Take 20 mg by mouth daily.    Marland Kitchen lisinopril (PRINIVIL,ZESTRIL) 5 MG tablet Take 1 tablet by mouth daily. Take 1 tab daily  0  . Omega-3 Fatty Acids (FISH OIL) 1200 MG CAPS Take 1 capsule by mouth every morning.     . Probiotic Product ([place]) CAPS Take 1 capsule by mouth every morning.    . psyllium (METAMUCIL) 58.6 % powder Take 1 packet by mouth at bedtime.    Marland Kitchen VITAMIN E PO Take 1 tablet by mouth every morning.      No current facility-administered medications for this visit.     Allergies as of 09/01/2016 - Review Complete 09/01/2016  Allergen Reaction Noted  . Demerol Nausea And Vomiting 04/01/2011  . Moviprep [peg-kcl-nacl-nasulf-na asc-c] Nausea And Vomiting 10/04/2012  . Declomycin [demeclocycline] Rash 07/23/2013    Past Medical History:  Diagnosis Date  . Coronary artery disease 01/08/10   STATUS POST STENTING OF THE LAD AND THE FIRST OBTUSE MARGINAL VESSEL  . Diverticulitis   . Headache(784.0)   . Heart disease   . Herpes zoster   . Hypercholesterolemia   . Hyperlipidemia   . Phlebitis Right   Leg  . Stomach ulcer     Past Surgical History:  Procedure Laterality Date  . ABDOMINAL HYSTERECTOMY     with bladder tack  . BREAST BIOPSY Left    Normal  . Bunionectomy Right   . CARDIAC CATHETERIZATION  01/08/10  . CARDIAC CATHETERIZATION  01/05/10   NORMAL LEFT VENTRICULAR SIZE AND NORMAL SYSTOLIC FUNCTION. EF IS 60%.   Marland Kitchen CATARACT EXTRACTION    . COLONOSCOPY  07/13/2011   RMR:Lax anal sphincter tone otherwise normal/left sided diverticula, polyp removed but not analyzed  .  CORONARY STENT PLACEMENT    . VAGINAL HYSTERECTOMY    . VEIN LIGATION      Family History  Problem Relation Age of Onset  . Emphysema Father   . Colon cancer Father   . Lupus Child   . Heart disease Sister   . Heart disease Paternal Grandmother   . Hypertension Paternal Grandmother   . Heart disease Maternal Grandmother   . Hypertension Maternal Grandmother     Social History   Social History  . Marital status: Married    Spouse name: N/A  . Number of children: 5  . Years of education: N/A   Occupational History  . housework    Social History Main Topics  . Smoking  status: Never Smoker  . Smokeless tobacco: Never Used  . Alcohol use Yes     Comment: Socially  . Drug use: No  . Sexual activity: Yes    Birth control/ protection: Surgical   Other Topics Concern  . Not on file   Social History Narrative   ** Merged History Encounter **          ROS:  General: Negative for anorexia, weight loss, fever, chills, fatigue, weakness. Eyes: Negative for vision changes.  ENT: Negative for hoarseness, difficulty swallowing , nasal congestion. CV: Negative for chest pain, angina, palpitations, dyspnea on exertion, peripheral edema.  Respiratory: Negative for dyspnea at rest, dyspnea on exertion, cough, sputum, wheezing.  GI: See history of present illness. GU:  Negative for dysuria, hematuria, urinary incontinence, urinary frequency, nocturnal urination.  MS: Negative for joint pain, low back pain.  Derm: Negative for rash or itching.  Neuro: Negative for weakness, abnormal sensation, seizure, frequent headaches, memory loss, confusion.  Psych: Negative for anxiety, depression, suicidal ideation, hallucinations.  Endo: Negative for unusual weight change.  Heme: Negative for bruising or bleeding. Allergy: Negative for rash or hives.    Physical Examination:  BP 127/71   Pulse 72   Temp 97.2 F (36.2 C) (Oral)   Ht 5\' 1"  (1.549 m)   Wt 141 lb (64 kg)   BMI 26.64 kg/m    General: Well-nourished, well-developed in no acute distress.  Head: Normocephalic, atraumatic.   Eyes: Conjunctiva pink, no icterus. Mouth: Oropharyngeal mucosa moist and pink , no lesions erythema or exudate. Neck: Supple without thyromegaly, masses, or lymphadenopathy.  Lungs: Clear to auscultation bilaterally.  Heart: Regular rate and rhythm, no murmurs rubs or gallops.  Abdomen: Bowel sounds are normal, nontender, nondistended, no hepatosplenomegaly or masses, no abdominal bruits or    hernia , no rebound or guarding.   Rectal: not performed Extremities: No lower  extremity edema. No clubbing or deformities.  Neuro: Alert and oriented x 4 , grossly normal neurologically.  Skin: Warm and dry, no rash or jaundice.   Psych: Alert and cooperative, normal mood and affect.  Imaging Studies: No results found.

## 2016-09-21 NOTE — Interval H&P Note (Signed)
History and Physical Interval Note:  09/21/2016 12:55 PM  Ashley [name]  has presented today for surgery, with the diagnosis of rectal bleeding, FHCRC, personal hx colon polyps  The various methods of treatment have been discussed with the patient and family. After consideration of risks, benefits and other options for treatment, the patient has consented to  Procedure(s) with comments: COLONOSCOPY (N/A) - 1:00 pm as a surgical intervention .  The patient's history has been reviewed, patient examined, no change in status, stable for surgery.  I have reviewed the patient's chart and labs.  Questions were answered to the patient's satisfaction.      No change. Surveillance colonoscopy per plan. The risks, benefits, limitations, alternatives and imponderables have been reviewed with the patient. Questions have been answered. All parties are agreeable.  Manus Rudd

## 2016-09-23 ENCOUNTER — Encounter (HOSPITAL_COMMUNITY): Payer: Self-pay | Admitting: Internal Medicine

## 2016-10-30 DIAGNOSIS — R399 Unspecified symptoms and signs involving the genitourinary system: Secondary | ICD-10-CM | POA: Diagnosis not present

## 2016-10-30 DIAGNOSIS — N3090 Cystitis, unspecified without hematuria: Secondary | ICD-10-CM | POA: Diagnosis not present

## 2016-10-31 DIAGNOSIS — N390 Urinary tract infection, site not specified: Secondary | ICD-10-CM | POA: Diagnosis not present

## 2016-10-31 DIAGNOSIS — I10 Essential (primary) hypertension: Secondary | ICD-10-CM | POA: Diagnosis not present

## 2016-10-31 DIAGNOSIS — E785 Hyperlipidemia, unspecified: Secondary | ICD-10-CM | POA: Diagnosis not present

## 2016-11-01 DIAGNOSIS — N390 Urinary tract infection, site not specified: Secondary | ICD-10-CM | POA: Diagnosis not present

## 2016-11-01 DIAGNOSIS — I10 Essential (primary) hypertension: Secondary | ICD-10-CM | POA: Diagnosis not present

## 2016-11-02 ENCOUNTER — Ambulatory Visit: Payer: Medicare Other | Admitting: Gastroenterology

## 2016-11-10 ENCOUNTER — Encounter: Payer: Self-pay | Admitting: Gastroenterology

## 2016-11-10 ENCOUNTER — Ambulatory Visit (INDEPENDENT_AMBULATORY_CARE_PROVIDER_SITE_OTHER): Payer: Medicare Other | Admitting: Gastroenterology

## 2016-11-10 DIAGNOSIS — R151 Fecal smearing: Secondary | ICD-10-CM

## 2016-11-10 DIAGNOSIS — I2510 Atherosclerotic heart disease of native coronary artery without angina pectoris: Secondary | ICD-10-CM | POA: Diagnosis not present

## 2016-11-10 DIAGNOSIS — K640 First degree hemorrhoids: Secondary | ICD-10-CM

## 2016-11-10 DIAGNOSIS — I2583 Coronary atherosclerosis due to lipid rich plaque: Secondary | ICD-10-CM | POA: Diagnosis not present

## 2016-11-10 HISTORY — DX: First degree hemorrhoids: K64.0

## 2016-11-10 NOTE — Assessment & Plan Note (Signed)
Overall doing much better with fiber supplement. Still has fecal smearing/soilage about once per week. Likely related to hemorrhoids and/or lax sphincter tone. Overall she is doing much better however. She is interested in pursuing hemorrhoid banding at some point in the near future. We discussed [facility] banding and she will call to schedule an appointment for [place] banding when she is ready. Otherwise we'll see her back as needed.

## 2016-11-10 NOTE — Patient Instructions (Signed)
1. If you decide to have your hemorrhoids banded, please call our office and request appointment for "hemorrhoid banding with Dr. Gala Romney".  2. Return to the office as needed.

## 2016-11-10 NOTE — Progress Notes (Signed)
CC'D TO PCP °

## 2016-11-10 NOTE — Progress Notes (Signed)
      Primary Care Physician: Maggie Font, MD  Primary Gastroenterologist:  Garfield Cornea, MD   Chief Complaint  Patient presents with  . Colonoscopy    post TCS f/u-no problems    HPI: Ashley [name] is a 70 y.o. female hereFor procedure follow-up. She underwent a colonoscopy on November 8 for high risk colon cancer surveillance, personal history of colon polyps, fecal smearing. Internal hemorrhoids found, grade 1. She had a lax sphincter tone on rectal exam. Scattered small and large mouth diverticula in the sigmoid colon. Consider colonoscopy in 5 years for surveillance of health permits. She presents back today doing very well. She is taking a chewable fiber everyday. She has had less issues with fecal smearing but this still occurs at least once per week. Unfortunately she has had upper respiratory infection and a UTI since we last saw her. Denies abdominal pain. No blood in the stool. No heartburn. She is interested in hemorrhoid banding at some point in the near future.   Current Outpatient Prescriptions  Medication Sig Dispense Refill  . aspirin EC 81 MG tablet Take 81 mg by mouth at bedtime.    Marland Kitchen atorvastatin (LIPITOR) 20 MG tablet Take 20 mg by mouth at bedtime.     . Calcium Carb-Cholecalciferol (OS-CAL CALCIUM + D3 PO) Take 1 tablet by mouth daily. Calcium 1200 mg with Vitamin D    . cetirizine (ZYRTEC) 10 MG tablet Take 10 mg by mouth daily.    . cholecalciferol (VITAMIN D) 1000 units tablet Take 1,000 Units by mouth daily.    . furosemide (LASIX) 20 MG tablet Take 20 mg by mouth daily.    Marland Kitchen lisinopril (PRINIVIL,ZESTRIL) 5 MG tablet Take 1 tablet by mouth daily. Take 1 tab daily  0  . Omega-3 Fatty Acids (FISH OIL) 1200 MG CAPS Take 1 capsule by mouth every morning.    . vitamin B-12 (CYANOCOBALAMIN) 1000 MCG tablet Take 1,000 mcg by mouth daily.    . vitamin E 400 UNIT capsule Take 400 Units by mouth daily.     No current facility-administered medications for this  visit.     Allergies as of 11/10/2016 - Review Complete 11/10/2016  Allergen Reaction Noted  . Demerol Nausea And Vomiting 04/01/2011  . Moviprep [peg-kcl-nacl-nasulf-na asc-c] Nausea And Vomiting 10/04/2012  . Declomycin [demeclocycline] Rash 07/23/2013    ROS:  General: Negative for anorexia, weight loss, fever, chills, fatigue, weakness. ENT: Negative for hoarseness, difficulty swallowing , nasal congestion. CV: Negative for chest pain, angina, palpitations, dyspnea on exertion, peripheral edema.  Respiratory: Negative for dyspnea at rest, dyspnea on exertion, cough, sputum, wheezing.  GI: See history of present illness. GU:  Negative for dysuria, hematuria, urinary incontinence, urinary frequency, nocturnal urination.  Endo: Negative for unusual weight change.    Physical Examination:   BP (!) 139/58   Pulse 72   Temp 98.2 F (36.8 C) (Oral)   Ht 5\' 1"  (1.549 m)   Wt 141 lb 6.4 oz (64.1 kg)   BMI 26.72 kg/m   General: Well-nourished, well-developed in no acute distress.  Eyes: No icterus. Mouth: Oropharyngeal mucosa moist and pink , no lesions erythema or exudate. Neuro: Alert and oriented x 4   Skin: Warm and dry, no jaundice.   Psych: Alert and cooperative, normal mood and affect.

## 2016-12-21 ENCOUNTER — Ambulatory Visit: Payer: Medicare Other | Admitting: Cardiology

## 2017-01-11 ENCOUNTER — Ambulatory Visit: Payer: Medicare Other | Admitting: Cardiology

## 2017-01-12 ENCOUNTER — Encounter: Payer: Self-pay | Admitting: Cardiology

## 2017-01-15 NOTE — Progress Notes (Deleted)
Ashley [name] Date of Birth: November 14, 1946   History of Present Illness: Ashley [name] is seen  for followup CAD.  She is status post stenting of the proximal LAD and first obtuse marginal vessels in February 2011 with bare-metal stents. She had a normal stress Echo in 3/15.   On follow up today she denies any chest pain or SOB. She is very active playing pickleball and dancing. She won a Engineer, water in [employer]. Reports recent labs with primary care looked good. No edema, palpitations, PND, or dizziness. Has some temporal HA but states this is not unusual for her in the winter.   Current Outpatient Prescriptions on File Prior to Visit  Medication Sig Dispense Refill  . aspirin EC 81 MG tablet Take 81 mg by mouth at bedtime.    Marland Kitchen atorvastatin (LIPITOR) 20 MG tablet Take 20 mg by mouth at bedtime.     . Calcium Carb-Cholecalciferol (OS-CAL CALCIUM + D3 PO) Take 1 tablet by mouth daily. Calcium 1200 mg with Vitamin D    . cetirizine (ZYRTEC) 10 MG tablet Take 10 mg by mouth daily.    . cholecalciferol (VITAMIN D) 1000 units tablet Take 1,000 Units by mouth daily.    Marland Kitchen FIBER PO Take by mouth. Chewable tablets daily.    . furosemide (LASIX) 20 MG tablet Take 20 mg by mouth daily.    Marland Kitchen lisinopril (PRINIVIL,ZESTRIL) 5 MG tablet Take 1 tablet by mouth daily. Take 1 tab daily  0  . Omega-3 Fatty Acids (FISH OIL) 1200 MG CAPS Take 1 capsule by mouth every morning.    . vitamin B-12 (CYANOCOBALAMIN) 1000 MCG tablet Take 1,000 mcg by mouth daily.    . vitamin E 400 UNIT capsule Take 400 Units by mouth daily.     No current facility-administered medications on file prior to visit.     Allergies  Allergen Reactions  . Demerol Nausea And Vomiting  . Moviprep [Peg-Kcl-Nacl-Nasulf-Na Asc-C] Nausea And Vomiting  . Declomycin [Demeclocycline] Rash    Past Medical History:  Diagnosis Date  . Coronary artery disease 01/08/10   STATUS POST STENTING OF THE LAD AND THE FIRST OBTUSE MARGINAL  VESSEL  . Diverticulitis   . Headache(784.0)   . Heart disease   . Herpes zoster   . Hypercholesterolemia   . Hyperlipidemia   . Phlebitis Right   Leg  . PONV (postoperative nausea and vomiting)   . Stomach ulcer     Past Surgical History:  Procedure Laterality Date  . ABDOMINAL HYSTERECTOMY     with bladder tack  . BREAST BIOPSY Left    Normal  . Bunionectomy Right   . CARDIAC CATHETERIZATION  01/08/10  . CARDIAC CATHETERIZATION  01/05/10   NORMAL LEFT VENTRICULAR SIZE AND NORMAL SYSTOLIC FUNCTION. EF IS 60%.   Marland Kitchen CATARACT EXTRACTION    . COLONOSCOPY  07/13/2011   RMR:Lax anal sphincter tone otherwise normal/left sided diverticula, polyp removed but not analyzed  . COLONOSCOPY N/A 09/21/2016   Procedure: COLONOSCOPY;  Surgeon: Daneil Dolin, MD;  Location: [facility];  Service: Endoscopy;  Laterality: N/A;  1:00 pm  . CORONARY STENT PLACEMENT    . VAGINAL HYSTERECTOMY    . VEIN LIGATION      History  Smoking Status  . Never Smoker  Smokeless Tobacco  . Never Used    History  Alcohol Use  . Yes    Comment: Socially    Family History  Problem Relation Age of Onset  .  Emphysema Father   . Colon cancer Father   . Lupus Child   . Heart disease Sister   . Heart disease Paternal Grandmother   . Hypertension Paternal Grandmother   . Heart disease Maternal Grandmother   . Hypertension Maternal Grandmother     Review of Systems: As noted in history of present illness.  All other systems were reviewed and are negative.  Physical Exam: There were no vitals taken for this visit. She is a pleasant white female who is in no distress. HEENT exam is unremarkable. She has no JVD or bruits. Lungs are clear. Cardiac exam reveals a regular rate and rhythm without gallop or murmur. Abdomen is soft and nontender. She has no edema. Pedal pulses are good. Neurologic exam is nonfocal.  LABORATORY DATA: ECG today is normal. Rate 82.  I have personally reviewed and interpreted  this study.   Assessment / Plan: 1. Coronary disease status post 2 vessel stenting with bare-metal stents 2011. Normal stress Echo in March 2015,  She remains asymptomatic. Continue with aspirin therapy. Continue on statin with Lipitor.   2. Hypercholesterolemia. On 20 mg of Lipitor and encouraged and Mediterranean style diet. Will review results of recent lab work from primary care.

## 2017-01-18 ENCOUNTER — Ambulatory Visit: Payer: Medicare Other | Admitting: Cardiology

## 2017-02-14 NOTE — Progress Notes (Signed)
Ashley [name] Date of Birth: Sep 27, 1946   History of Present Illness: Ashley [name] is seen  for followup CAD.  She is status post stenting of the proximal LAD and first obtuse marginal vessels in February 2011 with bare-metal stents. She had a normal stress Echo in 3/15.   On follow up today she denies any chest pain or SOB. She is very active playing pickleball and dancing. She participates in 20 events in the [employer].  Reports she just had lab work with primary care and it was good.  No edema, palpitations, PND, or dizziness. Had colonoscopy in November and this was also OK.   Current Outpatient Prescriptions on File Prior to Visit  Medication Sig Dispense Refill  . aspirin EC 81 MG tablet Take 81 mg by mouth at bedtime.    Marland Kitchen atorvastatin (LIPITOR) 20 MG tablet Take 20 mg by mouth at bedtime.     . Calcium Carb-Cholecalciferol (OS-CAL CALCIUM + D3 PO) Take 1 tablet by mouth daily. Calcium 1200 mg with Vitamin D    . cholecalciferol (VITAMIN D) 1000 units tablet Take 1,000 Units by mouth daily.    . furosemide (LASIX) 20 MG tablet Take 20 mg by mouth daily.    Marland Kitchen lisinopril (PRINIVIL,ZESTRIL) 5 MG tablet Take 1 tablet by mouth daily. Take 1 tab daily  0  . Omega-3 Fatty Acids (FISH OIL) 1200 MG CAPS Take 1 capsule by mouth every morning.    . vitamin B-12 (CYANOCOBALAMIN) 1000 MCG tablet Take 1,000 mcg by mouth daily.    . vitamin E 400 UNIT capsule Take 400 Units by mouth daily.     No current facility-administered medications on file prior to visit.     Allergies  Allergen Reactions  . Demerol Nausea And Vomiting  . Moviprep [Peg-Kcl-Nacl-Nasulf-Na Asc-C] Nausea And Vomiting  . Declomycin [Demeclocycline] Rash    Past Medical History:  Diagnosis Date  . Coronary artery disease 01/08/10   STATUS POST STENTING OF THE LAD AND THE FIRST OBTUSE MARGINAL VESSEL  . Diverticulitis   . Headache(784.0)   . Heart disease   . Herpes zoster   . Hypercholesterolemia   .  Hyperlipidemia   . Phlebitis Right   Leg  . PONV (postoperative nausea and vomiting)   . Stomach ulcer     Past Surgical History:  Procedure Laterality Date  . ABDOMINAL HYSTERECTOMY     with bladder tack  . BREAST BIOPSY Left    Normal  . Bunionectomy Right   . CARDIAC CATHETERIZATION  01/08/10  . CARDIAC CATHETERIZATION  01/05/10   NORMAL LEFT VENTRICULAR SIZE AND NORMAL SYSTOLIC FUNCTION. EF IS 60%.   Marland Kitchen CATARACT EXTRACTION    . COLONOSCOPY  07/13/2011   RMR:Lax anal sphincter tone otherwise normal/left sided diverticula, polyp removed but not analyzed  . COLONOSCOPY N/A 09/21/2016   Procedure: COLONOSCOPY;  Surgeon: Daneil Dolin, MD;  Location: [facility];  Service: Endoscopy;  Laterality: N/A;  1:00 pm  . CORONARY STENT PLACEMENT    . VAGINAL HYSTERECTOMY    . VEIN LIGATION      History  Smoking Status  . Never Smoker  Smokeless Tobacco  . Never Used    History  Alcohol Use  . Yes    Comment: Socially    Family History  Problem Relation Age of Onset  . Emphysema Father   . Colon cancer Father   . Lupus Child   . Heart disease Sister   . Heart  disease Paternal Grandmother   . Hypertension Paternal Grandmother   . Heart disease Maternal Grandmother   . Hypertension Maternal Grandmother     Review of Systems: As noted in history of present illness.  All other systems were reviewed and are negative.  Physical Exam: BP 122/62 (BP Location: Right Arm)   Pulse 64   Ht 5\' 1"  (1.549 m)   Wt 142 lb (64.4 kg)   BMI 26.83 kg/m  She is a pleasant white female who is in no distress. HEENT exam is unremarkable. She has no JVD or bruits. Lungs are clear. Cardiac exam reveals a regular rate and rhythm without gallop or murmur. Abdomen is soft and nontender. She has no edema. Pedal pulses are good. Neurologic exam is nonfocal.  LABORATORY DATA: ECG today is normal. Rate 64.  I have personally reviewed and interpreted this study.   Assessment / Plan: 1. Coronary  disease status post 2 vessel stenting with bare-metal stents 2011. Normal stress Echo in March 2015,  She remains asymptomatic. Continue with aspirin therapy. Continue on statin with Lipitor.   2. Hypercholesterolemia. On  Lipitor. Labs followed by primary care. Continue  Mediterranean style diet.   I will follow up in 1 year.

## 2017-02-15 ENCOUNTER — Encounter: Payer: Self-pay | Admitting: Cardiology

## 2017-02-15 ENCOUNTER — Ambulatory Visit (INDEPENDENT_AMBULATORY_CARE_PROVIDER_SITE_OTHER): Payer: Medicare Other | Admitting: Cardiology

## 2017-02-15 VITALS — BP 122/62 | HR 64 | Ht 61.0 in | Wt 142.0 lb

## 2017-02-15 DIAGNOSIS — I2510 Atherosclerotic heart disease of native coronary artery without angina pectoris: Secondary | ICD-10-CM | POA: Diagnosis not present

## 2017-02-15 DIAGNOSIS — I2583 Coronary atherosclerosis due to lipid rich plaque: Secondary | ICD-10-CM | POA: Diagnosis not present

## 2017-02-15 DIAGNOSIS — E7800 Pure hypercholesterolemia, unspecified: Secondary | ICD-10-CM

## 2017-02-15 NOTE — Patient Instructions (Signed)
Continue your current therapy  I will see you in one year   

## 2017-02-21 ENCOUNTER — Ambulatory Visit: Payer: Medicare Other | Admitting: Orthopedic Surgery

## 2017-03-07 ENCOUNTER — Ambulatory Visit: Payer: Medicare Other | Admitting: Orthopedic Surgery

## 2017-04-28 ENCOUNTER — Telehealth: Payer: Self-pay

## 2017-04-28 NOTE — Telephone Encounter (Signed)
Received clearance from Dr.Groat.Dr.Jordan cleared patient for upcoming surgery.Form faxed back to fax # [phone].

## 2017-05-19 ENCOUNTER — Other Ambulatory Visit (HOSPITAL_COMMUNITY): Payer: Self-pay | Admitting: Family Medicine

## 2017-05-19 DIAGNOSIS — Z1231 Encounter for screening mammogram for malignant neoplasm of breast: Secondary | ICD-10-CM

## 2017-05-29 ENCOUNTER — Ambulatory Visit (HOSPITAL_COMMUNITY)
Admission: RE | Admit: 2017-05-29 | Discharge: 2017-05-29 | Disposition: A | Discharge status: Home / Self Care | Payer: Medicare Other | Source: Ambulatory Visit | Attending: Family Medicine | Admitting: Family Medicine

## 2017-05-29 DIAGNOSIS — Z1231 Encounter for screening mammogram for malignant neoplasm of breast: Secondary | ICD-10-CM | POA: Diagnosis not present

## 2017-06-09 ENCOUNTER — Encounter: Payer: Self-pay | Admitting: Pediatrics

## 2017-06-09 ENCOUNTER — Ambulatory Visit (INDEPENDENT_AMBULATORY_CARE_PROVIDER_SITE_OTHER): Payer: Medicare Other | Admitting: Pediatrics

## 2017-06-09 VITALS — BP 131/64 | HR 64 | Temp 97.7°F | Ht 61.0 in | Wt 147.0 lb

## 2017-06-09 DIAGNOSIS — L720 Epidermal cyst: Secondary | ICD-10-CM | POA: Diagnosis not present

## 2017-06-09 DIAGNOSIS — I10 Essential (primary) hypertension: Secondary | ICD-10-CM | POA: Diagnosis not present

## 2017-06-09 DIAGNOSIS — I2510 Atherosclerotic heart disease of native coronary artery without angina pectoris: Secondary | ICD-10-CM | POA: Diagnosis not present

## 2017-06-09 DIAGNOSIS — E7800 Pure hypercholesterolemia, unspecified: Secondary | ICD-10-CM | POA: Diagnosis not present

## 2017-06-09 DIAGNOSIS — L570 Actinic keratosis: Secondary | ICD-10-CM

## 2017-06-09 DIAGNOSIS — I2583 Coronary atherosclerosis due to lipid rich plaque: Secondary | ICD-10-CM

## 2017-06-09 MED ORDER — FUROSEMIDE 20 MG PO TABS: 20.0000 mg | ORAL_TABLET | Freq: Every day | ORAL | 1 refills | Status: DC

## 2017-06-09 MED ORDER — LISINOPRIL 5 MG PO TABS: 5.0000 mg | ORAL_TABLET | Freq: Every day | ORAL | 1 refills | Status: DC

## 2017-06-09 MED ORDER — ATORVASTATIN CALCIUM 20 MG PO TABS: 20.0000 mg | ORAL_TABLET | Freq: Every day | ORAL | 1 refills | Status: DC

## 2017-06-09 MED ORDER — MUPIROCIN 2 % EX OINT: TOPICAL_OINTMENT | CUTANEOUS | 1 refills | Status: DC

## 2017-06-09 NOTE — Progress Notes (Signed)
Subjective:   Patient ID: Ashley [name], female    DOB: Aug 09, 1946, 71 y.o.   MRN: [id number] CC: New Patient (Initial Visit) and Recurrent Skin Infections (Vaginal Area)  HPI: Ashley [name] is a 71 y.o. 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 Coronary artery disease 01/08/10   STATUS POST STENTING OF THE LAD AND THE FIRST OBTUSE MARGINAL VESSEL  . Diverticulitis   . Headache(784.0)   . Heart disease   . Herpes zoster   . Hypercholesterolemia   . Hyperlipidemia   . Phlebitis Right   Leg  . PONV (postoperative nausea and vomiting)   . Stomach ulcer    Family History  Problem Relation Age of Onset  . Emphysema Father   . Colon cancer Father   . Lupus Child   . Heart disease Sister   . Heart disease Paternal Grandmother   . Hypertension Paternal Grandmother   . Heart disease Maternal Grandmother   . Hypertension Maternal Grandmother    Social History   Social History  . Marital status: Married    Spouse name: N/A  . Number of children: 5  . Years of education: N/A   Occupational History  . housework    Social History Main Topics  . Smoking status: Never Smoker  . Smokeless tobacco: Never Used  . Alcohol use Yes     Comment: Socially  . Drug use: No  . Sexual activity: Yes    Birth control/ protection: Surgical   Other Topics Concern  . None   Social  History Narrative   ** Merged History Encounter **       ROS: All systems negative other than what is in HPI  Objective:    BP 131/64   Pulse 64   Temp 97.7 F (36.5 C) (Oral)   Ht '5\' 1"'$  (1.549 m)   Wt 147 lb (66.7 kg)   BMI 27.78 kg/m   Wt Readings from Last 3 Encounters:  06/09/17 147 lb (66.7 kg)  02/15/17 142 lb (64.4 kg)  11/10/16 141 lb 6.4 oz (64.1 kg)    Gen: NAD, alert, cooperative with exam, NCAT EYES: EOMI, no conjunctival injection, or no icterus ENT:   OP without erythema CV: NRRR, normal S1/S2, no murmur, distal pulses 2+ b/l Resp: CTABL, no wheezes, normal WOB Abd: +BS, soft, NTND.  Ext: No edema, warm Neuro: Alert and oriented, strength equal b/l UE and LE, coordination grossly normal MSK: normal muscle bulk GU: R lateral labia with small hard nodule consistent with epidermoid cyst  Assessment & Plan:  Ashley [name] was seen today for new patient (initial visit) and recurrent skin infections.  Diagnoses and all orders for this visit:  Coronary artery disease due to lipid rich plaque Stable, asymptomatic Following with cardiology  Hypercholesterolemia Stable, cont med -  atorvastatin (LIPITOR) 20 MG tablet; Take 1 tablet (20 mg total) by mouth at bedtime. -     Lipid panel  Essential hypertension Adequate control, con meds -     furosemide (LASIX) 20 MG tablet; Take 1 tablet (20 mg total) by mouth daily. -     lisinopril (PRINIVIL,ZESTRIL) 5 MG tablet; Take 1 tablet (5 mg total) by mouth daily. Take 1 tab daily -     CMP14+EGFR  Epidermoid cyst Will watch for now, not infected Pt declines referral for removal  AK (actinic keratosis) See below  Lesion destruction: Discussed risks, benefits and alternatives, pt agreed to proceed. Liquid nitrogen on a qtip used to complete 2 freeze thaw cycles each on 1 total AK lesion on nose   Follow up plan: Return in about 3 months (around 09/09/2017). Ashley Found, MD [place]

## 2017-06-10 LAB — CMP14+EGFR
A/G RATIO: 2 (ref 1.2–2.2)
ALT: 26 IU/L (ref 0–32)
AST: 27 IU/L (ref 0–40)
Albumin: 4.5 g/dL (ref 3.5–4.8)
Alkaline Phosphatase: 52 IU/L (ref 39–117)
BILIRUBIN TOTAL: 0.6 mg/dL (ref 0.0–1.2)
BUN / CREAT RATIO: 18 (ref 12–28)
BUN: 14 mg/dL (ref 8–27)
CO2: 25 mmol/L (ref 20–29)
Calcium: 9.6 mg/dL (ref 8.7–10.3)
Chloride: 102 mmol/L (ref 96–106)
Creatinine, Ser: 0.76 mg/dL (ref 0.57–1.00)
GFR calc non Af Amer: 79 mL/min/{1.73_m2} (ref >59)
GFR, EST AFRICAN AMERICAN: 91 mL/min/{1.73_m2} (ref >59)
GLOBULIN, TOTAL: 2.2 g/dL (ref 1.5–4.5)
Glucose: 82 mg/dL (ref 65–99)
POTASSIUM: 4.1 mmol/L (ref 3.5–5.2)
SODIUM: 141 mmol/L (ref 134–144)
TOTAL PROTEIN: 6.7 g/dL (ref 6.0–8.5)

## 2017-06-10 LAB — LIPID PANEL
CHOL/HDL RATIO: 2.7 ratio (ref 0.0–4.4)
Cholesterol, Total: 168 mg/dL (ref 100–199)
HDL: 62 mg/dL (ref >39)
LDL Calculated: 92 mg/dL (ref 0–99)
TRIGLYCERIDES: 69 mg/dL (ref 0–149)
VLDL Cholesterol Cal: 14 mg/dL (ref 5–40)

## 2017-06-16 ENCOUNTER — Telehealth: Payer: Self-pay | Admitting: Pediatrics

## 2017-06-16 NOTE — Telephone Encounter (Signed)
Pt notified of results Verbalizes understanding 

## 2017-09-06 DIAGNOSIS — Z23 Encounter for immunization: Secondary | ICD-10-CM | POA: Diagnosis not present

## 2017-09-22 ENCOUNTER — Ambulatory Visit: Payer: Medicare Other | Admitting: Pediatrics

## 2017-09-22 ENCOUNTER — Encounter: Payer: Self-pay | Admitting: Pediatrics

## 2017-09-22 VITALS — BP 119/60 | HR 71 | Temp 98.0°F | Ht 61.0 in | Wt 141.4 lb

## 2017-09-22 DIAGNOSIS — I10 Essential (primary) hypertension: Secondary | ICD-10-CM | POA: Diagnosis not present

## 2017-09-22 DIAGNOSIS — I2583 Coronary atherosclerosis due to lipid rich plaque: Secondary | ICD-10-CM | POA: Diagnosis not present

## 2017-09-22 DIAGNOSIS — I2510 Atherosclerotic heart disease of native coronary artery without angina pectoris: Secondary | ICD-10-CM | POA: Diagnosis not present

## 2017-09-22 DIAGNOSIS — H029 Unspecified disorder of eyelid: Secondary | ICD-10-CM

## 2017-09-22 NOTE — Progress Notes (Signed)
  Subjective:   Patient ID: Ashley [name], female    DOB: 11-28-1945, 71 y.o.   MRN: [id number] CC: Follow-up (3 month) med problems HPI: Ashley [name] is a 71 y.o. female presenting for Follow-up (3 month)  Had plastic surgery in [facility] for sagging eye lids that were affecting vision Surgery was apprx 2.5 mo ago Over last couple of weeks has had a bump on medial R eye lid along surgical scar Doesn't hurt or itch, no redness Vision not bothered Pt very bothered by bump  Planning on f/u with specialist for banding of hemorrhoids  Sleeping from 2am to 7pm daily Feels well rested Not able to fall asleep earlier  HLD: on statin, tolerating well  CAD: taking meds regularly, no recent symptoms  Taking B6 daily for carpal tunnel syndrome, says has helped with symptoms a lot  Relevant past medical, surgical, family and social history reviewed. Allergies and medications reviewed and updated. Social History   Tobacco Use  Smoking Status Never Smoker  Smokeless Tobacco Never Used   ROS: Per HPI   Objective:    BP 119/60   Pulse 71   Temp 98 F (36.7 C) (Oral)   Ht 5\' 1"  (1.549 m)   Wt 141 lb 6.4 oz (64.1 kg)   BMI 26.72 kg/m   Wt Readings from Last 3 Encounters:  09/22/17 141 lb 6.4 oz (64.1 kg)  06/09/17 147 lb (66.7 kg)  02/15/17 142 lb (64.4 kg)    Gen: NAD, alert, cooperative with exam, NCAT EYES: EOMI, no conjunctival injection, or no icterus, R eye lid with 38mm flesh colored papule medially ENT:  TMs pearly gray b/l, OP without erythema LYMPH: no cervical LAD CV: NRRR, normal S1/S2, no murmur, distal pulses 2+ b/l Resp: CTABL, no wheezes, normal WOB Abd: +BS, soft, NTND.  Ext: No edema, warm Neuro: Alert and oriented, strength equal b/l UE and LE, coordination grossly normal MSK: neg phalens, hand grip 5/5 b/l  Assessment & Plan:  Jeda was seen today for follow-up med problems.  Diagnoses and all orders for this visit:  Coronary artery disease due  to lipid rich plaque Asymptomatic, continues dancing regularly  Essential hypertension Stable, cont med  Hyperlipidemia Taking statin regularly, tolerating well  Eyelid lesion Needs to return to eye doctor for re-eval  Follow up plan: Return in about 6 months (around 03/22/2018). Assunta Found, MD [place]

## 2017-11-30 ENCOUNTER — Encounter: Payer: Self-pay | Admitting: Family Medicine

## 2017-11-30 ENCOUNTER — Ambulatory Visit: Payer: Medicare Other | Admitting: Family Medicine

## 2017-11-30 VITALS — BP 130/63 | HR 71 | Temp 97.3°F | Ht 61.0 in | Wt 142.0 lb

## 2017-11-30 DIAGNOSIS — N3001 Acute cystitis with hematuria: Secondary | ICD-10-CM

## 2017-11-30 LAB — URINALYSIS, COMPLETE
BILIRUBIN UA: NEGATIVE
GLUCOSE, UA: NEGATIVE
KETONES UA: NEGATIVE
NITRITE UA: NEGATIVE
PROTEIN UA: NEGATIVE
SPEC GRAV UA: 1.01 (ref 1.005–1.030)
UUROB: 0.2 mg/dL (ref 0.2–1.0)
pH, UA: 7 (ref 5.0–7.5)

## 2017-11-30 LAB — MICROSCOPIC EXAMINATION

## 2017-11-30 MED ORDER — CEPHALEXIN 500 MG PO CAPS: 500.0000 mg | ORAL_CAPSULE | Freq: Two times a day (BID) | ORAL | 0 refills | Status: AC

## 2017-11-30 MED ORDER — FLUCONAZOLE 150 MG PO TABS: 150.0000 mg | ORAL_TABLET | Freq: Once | ORAL | 0 refills | Status: AC

## 2017-11-30 NOTE — Progress Notes (Signed)
Subjective: CC: ?UTI PCP: Eustaquio Maize, MD HDQ:QIWLN Ashley [name] is Ashley 72 y.o. female presenting to clinic today for:  1. Urinary symptoms Patient reports Ashley 1 day h/o pelvic pressure and dysuria.  She reports urinary frequency at baseline because she is on Lasix.  Denies urgency, hematuria, fevers, chills, abdominal pain, nausea, vomiting, back pain, vaginal discharge.  Patient has used nothing for symptoms.  Patient denies Ashley h/o frequent or recurrent UTIs.  She does report that she gets yeast infections with antibiotics.   ROS: Per HPI  Allergies  Allergen Reactions  . Demerol Nausea And Vomiting  . Moviprep [Peg-Kcl-Nacl-Nasulf-Na Asc-C] Nausea And Vomiting  . Declomycin [Demeclocycline] Rash   Past Medical History:  Diagnosis Date  . Coronary artery disease 01/08/10   STATUS POST STENTING OF THE LAD AND THE FIRST OBTUSE MARGINAL VESSEL  . Diverticulitis   . Headache(784.0)   . Heart disease   . Herpes zoster   . Hypercholesterolemia   . Hyperlipidemia   . Phlebitis Right   Leg  . PONV (postoperative nausea and vomiting)   . Stomach ulcer     Current Outpatient Medications:  .  aspirin EC 81 MG tablet, Take 81 mg by mouth at bedtime., Disp: , Rfl:  .  atorvastatin (LIPITOR) 20 MG tablet, Take 1 tablet (20 mg total) by mouth at bedtime., Disp: 90 tablet, Rfl: 1 .  Calcium Carb-Cholecalciferol (OS-CAL CALCIUM + D3 PO), Take 1 tablet by mouth daily. Calcium 1200 mg with Vitamin D, Disp: , Rfl:  .  cholecalciferol (VITAMIN D) 1000 units tablet, Take 1,000 Units by mouth daily., Disp: , Rfl:  .  furosemide (LASIX) 20 MG tablet, Take 1 tablet (20 mg total) by mouth daily., Disp: 90 tablet, Rfl: 1 .  lisinopril (PRINIVIL,ZESTRIL) 5 MG tablet, Take 1 tablet (5 mg total) by mouth daily. Take 1 tab daily, Disp: 90 tablet, Rfl: 1 .  mupirocin ointment (BACTROBAN) 2 %, Use twice Ashley day on affected areas, Disp: 30 g, Rfl: 1 .  Omega-3 Fatty Acids (FISH OIL) 1200 MG CAPS, Take 1  capsule by mouth every morning., Disp: , Rfl:  .  Psyllium (METAMUCIL PO), Take one (1) teaspoon by mouth daily at bedtime., Disp: , Rfl:  .  pyridoxine (B-6) 200 MG tablet, Take 200 mg daily by mouth., Disp: , Rfl:  .  vitamin B-12 (CYANOCOBALAMIN) 1000 MCG tablet, Take 1,000 mcg by mouth daily., Disp: , Rfl:  Social History   Socioeconomic History  . Marital status: Married    Spouse name: Not on file  . Number of children: 5  . Years of education: Not on file  . Highest education level: Not on file  Social Needs  . Financial resource strain: Not on file  . Food insecurity - worry: Not on file  . Food insecurity - inability: Not on file  . Transportation needs - medical: Not on file  . Transportation needs - non-medical: Not on file  Occupational History  . Occupation: housework  Tobacco Use  . Smoking status: Never Smoker  . Smokeless tobacco: Never Used  Substance and Sexual Activity  . Alcohol use: Yes    Comment: Socially  . Drug use: No  . Sexual activity: Yes    Birth control/protection: Surgical  Other Topics Concern  . Not on file  Social History Narrative   ** Merged History Encounter **       Family History  Problem Relation Age of Onset  . Emphysema  Father   . Colon cancer Father   . Lupus Child   . Heart disease Sister   . Heart disease Paternal Grandmother   . Hypertension Paternal Grandmother   . Heart disease Maternal Grandmother   . Hypertension Maternal Grandmother     Objective: Office vital signs reviewed. BP 130/63   Pulse 71   Temp (!) 97.3 F (36.3 C) (Oral)   Ht 5\' 1"  (1.549 m)   Wt 142 lb (64.4 kg)   BMI 26.83 kg/m   Physical Examination:  General: Awake, alert, well nourished, nontoxic, no acute distress HEENT: Normal, moist mucous membranes, sclera white GU: no CVA TTP  Assessment/ Plan: 72 y.o. female   1. Acute cystitis with hematuria Patient is afebrile, well-appearing with normal vital signs.  No evidence of  pyelonephritis on exam.  Urinalysis does demonstrate leukocytes and red blood cells.  Urine microscopy with 3-10 red blood cells and bacteria appreciated.  This is been sent for urine culture.  Start Keflex p.o. twice daily for the next 5 days.  Diflucan sent in to cover for possible development of yeast infection.  Push oral fluids.  Avoid sugary beverages.  Home care instructions were reviewed with the patient.  Return precautions discussed.  Follow-up if needed. - Urinalysis, Complete - Urine Culture   Orders Placed This Encounter  Procedures  . Urine Culture  . Urinalysis, Complete   Meds ordered this encounter  Medications  . fluconazole (DIFLUCAN) 150 MG tablet    Sig: Take 1 tablet (150 mg total) by mouth once for 1 dose. If needed for yeast infection    Dispense:  1 tablet    Refill:  0  . cephALEXin (KEFLEX) 500 MG capsule    Sig: Take 1 capsule (500 mg total) by mouth 2 (two) times daily for 5 days.    Dispense:  10 capsule    Refill:  [place], DO [place] [phone]

## 2017-11-30 NOTE — Patient Instructions (Signed)
Your urine is consistent with a urinary tract infection.  I have prescribed you cephalexin to take twice a day for the next 5 days.  I have also prescribed Diflucan to use if needed for yeast infection.  Make sure that you are drinking plenty of water.  If your symptoms worsen or do not improve, please seek immediate medical attention for reevaluation.  I value your feedback and appreciate you entrusting [place] with your care.  If you get a survey, I would appreciate your taking the time to let us know what your experience was like.   Urinary Tract Infection, Adult A urinary tract infection (UTI) is an infection of any part of the urinary tract. The urinary tract includes the:  Kidneys.  Ureters.  Bladder.  Urethra.  These organs make, store, and get rid of pee (urine) in the body. Follow these instructions at home:  Take over-the-counter and prescription medicines only as told by your doctor.  If you were prescribed an antibiotic medicine, take it as told by your doctor. Do not stop taking the antibiotic even if you start to feel better.  Avoid the following drinks: ? Alcohol. ? Caffeine. ? Tea. ? Carbonated drinks.  Drink enough fluid to keep your pee clear or pale yellow.  Keep all follow-up visits as told by your doctor. This is important.  Make sure to: ? Empty your bladder often and completely. Do not to hold pee for long periods of time. ? Empty your bladder before and after sex. ? Wipe from front to back after a bowel movement if you are female. Use each tissue one time when you wipe. Contact a doctor if:  You have back pain.  You have a fever.  You feel sick to your stomach (nauseous).  You throw up (vomit).  Your symptoms do not get better after 3 days.  Your symptoms go away and then come back. Get help right away if:  You have very bad back pain.  You have very bad lower belly (abdominal) pain.  You are throwing up and cannot keep down any medicines or  water. This information is not intended to replace advice given to you by your health care provider. Make sure you discuss any questions you have with your health care provider. Document Released: 04/18/2008 Document Revised: 04/07/2016 Document Reviewed: 09/21/2015 Elsevier Interactive Patient Education  [employer].

## 2017-12-01 LAB — URINE CULTURE

## 2017-12-05 ENCOUNTER — Other Ambulatory Visit: Payer: Self-pay | Admitting: Family Medicine

## 2017-12-05 ENCOUNTER — Other Ambulatory Visit: Payer: Medicare Other

## 2017-12-05 DIAGNOSIS — Z87440 Personal history of urinary (tract) infections: Secondary | ICD-10-CM

## 2017-12-06 LAB — URINALYSIS, COMPLETE
Bilirubin, UA: NEGATIVE
Glucose, UA: NEGATIVE
Ketones, UA: NEGATIVE
Leukocytes, UA: NEGATIVE
Nitrite, UA: NEGATIVE
PH UA: 6 (ref 5.0–7.5)
Protein, UA: NEGATIVE
Specific Gravity, UA: 1.02 (ref 1.005–1.030)
UUROB: 0.2 mg/dL (ref 0.2–1.0)

## 2017-12-06 LAB — MICROSCOPIC EXAMINATION: RENAL EPITHEL UA: NONE SEEN /HPF

## 2017-12-09 ENCOUNTER — Telehealth: Payer: Self-pay | Admitting: Pediatrics

## 2017-12-09 ENCOUNTER — Ambulatory Visit (INDEPENDENT_AMBULATORY_CARE_PROVIDER_SITE_OTHER): Payer: Medicare Other | Admitting: Family

## 2017-12-09 ENCOUNTER — Encounter: Payer: Self-pay | Admitting: Family

## 2017-12-09 DIAGNOSIS — R310 Gross hematuria: Secondary | ICD-10-CM | POA: Diagnosis not present

## 2017-12-09 DIAGNOSIS — N3001 Acute cystitis with hematuria: Secondary | ICD-10-CM

## 2017-12-09 DIAGNOSIS — R399 Unspecified symptoms and signs involving the genitourinary system: Secondary | ICD-10-CM | POA: Diagnosis not present

## 2017-12-09 MED ORDER — CIPROFLOXACIN HCL 500 MG PO TABS: 500.0000 mg | ORAL_TABLET | Freq: Two times a day (BID) | ORAL | 0 refills | Status: DC

## 2017-12-09 NOTE — Progress Notes (Signed)
   Subjective:    Patient ID: Ashley [name], female    DOB: Jan 27, 1946, 72 y.o.   MRN: [id number]  Dysuria   This is a recurrent problem. The current episode started today. The problem has been unchanged. The quality of the pain is described as shooting (pressure). The pain is at a severity of 7/10. There has been no fever. Associated symptoms include flank pain, hematuria, hesitancy and urgency. Pertinent negatives include no chills, frequency, nausea or vomiting. She has tried increased fluids for the symptoms.      Review of Systems  Constitutional: Negative for chills.  Gastrointestinal: Negative for nausea and vomiting.  Genitourinary: Positive for dysuria, flank pain, hematuria, hesitancy and urgency. Negative for frequency.  All other systems reviewed and are negative.      Objective:   Physical Exam  Constitutional: She is oriented to person, place, and time. She appears well-developed and well-nourished. No distress.  HENT:  Head: Normocephalic and atraumatic.  Right Ear: External ear normal.  Mouth/Throat: Oropharynx is clear and moist.  Eyes: Pupils are equal, round, and reactive to light.  Neck: Normal range of motion. Neck supple. No thyromegaly present.  Cardiovascular: Normal rate, regular rhythm, normal heart sounds and intact distal pulses.  No murmur heard. Pulmonary/Chest: Effort normal and breath sounds normal. No respiratory distress. She has no wheezes.  Abdominal: Soft. Bowel sounds are normal. She exhibits no distension. There is no tenderness.  Musculoskeletal: Normal range of motion. She exhibits no edema or tenderness.  Neurological: She is alert and oriented to person, place, and time.  Skin: Skin is warm and dry.  Psychiatric: She has a normal mood and affect. Her behavior is normal. Judgment and thought content normal.  Vitals reviewed.    There were no vitals taken for this visit.     Assessment & Plan:  1. UTI symptoms - Urine Culture -  Urinalysis  2. Gross hematuria  3. Acute cystitis with hematuria Force fluids AZO over the counter X2 days RTO prn Culture pending If culture negative will do Urologists referral to recurrent hematuria - ciprofloxacin (CIPRO) 500 MG tablet; Take 1 tablet (500 mg total) by mouth 2 (two) times daily.  Dispense: 14 tablet; Refill: 0   Evelina Dun, FNP

## 2017-12-09 NOTE — Patient Instructions (Signed)

## 2017-12-09 NOTE — Telephone Encounter (Signed)
Pt's urine culture on 11/30/17 was negative and urine on 12/05/17 was negative. Pt needs to be seen by PCP, please schedule appt for patient. May need Urologists appt if blood continues?

## 2017-12-09 NOTE — Telephone Encounter (Signed)
Please advise if another antibiotic will be prescribed.

## 2017-12-09 NOTE — Telephone Encounter (Signed)
Patient has a follow up appointment scheduled. 

## 2017-12-09 NOTE — Telephone Encounter (Signed)
What symptoms do you have? Pressure and passing blood when she urinate  How long have you been sick? Started this morning  Have you been seen for this problem? Yes for UTI was taking antibiotic and finished it  If your provider decides to give you a prescription, which pharmacy would you like for it to be sent to? [facility]   Patient informed that this information will be sent to the clinical staff for review and that they should receive a follow up call.

## 2017-12-11 LAB — URINALYSIS
BILIRUBIN UA: NEGATIVE
Glucose, UA: NEGATIVE
Ketones, UA: NEGATIVE
NITRITE UA: NEGATIVE
PH UA: 6.5 (ref 5.0–7.5)
Specific Gravity, UA: 1.015 (ref 1.005–1.030)
UUROB: 0.2 mg/dL (ref 0.2–1.0)

## 2017-12-13 LAB — URINE CULTURE

## 2017-12-15 ENCOUNTER — Ambulatory Visit: Payer: Medicare Other | Admitting: Family Medicine

## 2017-12-15 ENCOUNTER — Encounter: Payer: Self-pay | Admitting: Family Medicine

## 2017-12-15 VITALS — BP 146/64 | HR 81 | Temp 97.4°F | Ht 61.0 in | Wt 143.6 lb

## 2017-12-15 DIAGNOSIS — R0982 Postnasal drip: Secondary | ICD-10-CM | POA: Diagnosis not present

## 2017-12-15 MED ORDER — FLUTICASONE PROPIONATE 50 MCG/ACT NA SUSP: 2.0000 | Freq: Every day | NASAL | 0 refills | Status: DC

## 2017-12-15 NOTE — Patient Instructions (Signed)
Great to meet you!  Flonase 2 sprays per nostril once daily for about 1-2 weeks to see if you can have good resolution of your nasal symptoms.

## 2017-12-15 NOTE — Progress Notes (Signed)
   HPI  Patient presents today here with cough Patient explains that for the last 3 days she has had nasal drainage, sore throat, frequent throat clearing, cough that is dry, and headache.  She denies any fever, chills, sweats.  PMH: Smoking status noted ROS: Per HPI  Objective: BP (!) 146/64   Pulse 81   Temp (!) 97.4 F (36.3 C) (Oral)   Ht 5\' 1"  (1.549 m)   Wt 143 lb 9.6 oz (65.1 kg)   BMI 27.13 kg/m  Gen: NAD, alert, cooperative with exam HEENT: NCAT, oropharynx moist and clear, TMs normal bilaterally CV: RRR, good S1/S2, no murmur Resp: CTABL, no wheezes, non-labored Ext: No edema, warm Neuro: Alert and oriented, No gross deficits  Assessment and plan:  #Postnasal drip Symptoms most likely related to postnasal drip Patient is at the end of a course of Cipro for UTI. Recommended Flonase Low threshold for follow-up if symptoms worsen    Meds ordered this encounter  Medications  . fluticasone (FLONASE) 50 MCG/ACT nasal spray    Sig: Place 2 sprays into both nostrils daily.    Dispense:  16 g    Refill:  0    Laroy Apple, MD [facility] 12/15/2017, 2:35 PM

## 2017-12-29 ENCOUNTER — Ambulatory Visit: Payer: Medicare Other | Admitting: Nurse Practitioner

## 2017-12-29 ENCOUNTER — Encounter: Payer: Self-pay | Admitting: Nurse Practitioner

## 2017-12-29 VITALS — BP 136/67 | HR 71 | Temp 97.1°F | Ht 61.0 in | Wt 139.0 lb

## 2017-12-29 DIAGNOSIS — J40 Bronchitis, not specified as acute or chronic: Secondary | ICD-10-CM | POA: Diagnosis not present

## 2017-12-29 MED ORDER — HYDROCODONE-HOMATROPINE 5-1.5 MG/5ML PO SYRP: 5.0000 mL | ORAL_SOLUTION | Freq: Four times a day (QID) | ORAL | 0 refills | Status: DC | PRN

## 2017-12-29 MED ORDER — PREDNISONE 20 MG PO TABS: ORAL_TABLET | ORAL | 0 refills | Status: DC

## 2017-12-29 MED ORDER — BENZONATATE 100 MG PO CAPS: 100.0000 mg | ORAL_CAPSULE | Freq: Three times a day (TID) | ORAL | 0 refills | Status: DC | PRN

## 2017-12-29 NOTE — Progress Notes (Signed)
Subjective:    Patient ID: Ashley [name], female    DOB: 1946/06/26, 72 y.o.   MRN: [id number]  HPI patient comes in today c/o cough. She says she has had it for 3 days. Cough is so bad that she will sometime throw up. She describes a dry cough. She does have some nasal discharge as well as post nasal drip. Only medicine she has tried is robitussin and flonase.    Review of Systems  Constitutional: Negative for chills and fever.  HENT: Positive for congestion, postnasal drip and rhinorrhea. Negative for sore throat and trouble swallowing.   Respiratory: Positive for cough (nonproductive).   Cardiovascular: Negative.   Gastrointestinal: Positive for vomiting (when she starts coughing and cannot stop.).  Neurological: Positive for headaches. Negative for dizziness.  Psychiatric/Behavioral: Negative.   All other systems reviewed and are negative.      Objective:   Physical Exam  Constitutional: She is oriented to person, place, and time. She appears well-developed and well-nourished. No distress.  HENT:  Right Ear: Hearing, tympanic membrane, external ear and ear canal normal.  Left Ear: Hearing, tympanic membrane, external ear and ear canal normal.  Nose: Mucosal edema and rhinorrhea present. Right sinus exhibits no maxillary sinus tenderness and no frontal sinus tenderness. Left sinus exhibits no maxillary sinus tenderness and no frontal sinus tenderness.  Mouth/Throat: Uvula is midline, oropharynx is clear and moist and mucous membranes are normal.  Neck: Normal range of motion. Neck supple.  Cardiovascular: Normal rate and regular rhythm.  Pulmonary/Chest: Effort normal and breath sounds normal.  Deep dry cough  Neurological: She is alert and oriented to person, place, and time.  Skin: Skin is warm.  Psychiatric: She has a normal mood and affect. Her behavior is normal. Judgment and thought content normal.   BP 136/67   Pulse 71   Temp (!) 97.1 F (36.2 C) (Oral)   Ht 5'  1" (1.549 m)   Wt 139 lb (63 kg)   BMI 26.26 kg/m       Assessment & Plan:   1. Bronchitis    Meds ordered this encounter  Medications  . benzonatate (TESSALON PERLES) 100 MG capsule    Sig: Take 1 capsule (100 mg total) by mouth 3 (three) times daily as needed for cough.    Dispense:  20 capsule    Refill:  0    Order Specific Question:   Supervising Provider    Answer:   VINCENT, Marleah L [[id number]]  . predniSONE (DELTASONE) 20 MG tablet    Sig: 2 po at sametime daily for 5 days    Dispense:  10 tablet    Refill:  0    Order Specific Question:   Supervising Provider    Answer:   VINCENT, Aarya L [[id number]]  . HYDROcodone-homatropine (HYCODAN) 5-1.5 MG/5ML syrup    Sig: Take 5 mLs by mouth every 6 (six) hours as needed for cough.    Dispense:  120 mL    Refill:  0    Order Specific Question:   Supervising Provider    Answer:   VINCENT, Brynlei L [[id number]]   1. Take meds as prescribed 2. Use a cool mist humidifier especially during the winter months and when heat has been humid. 3. Use saline nose sprays frequently 4. Saline irrigations of the nose can be very helpful if done frequently.  * 4X daily for 1 week*  * Use of a nettie pot can be helpful with this.  Follow directions with this* 5. Drink plenty of fluids 6. Keep thermostat turn down low 7.For any cough or congestion  Use plain Mucinex- regular strength or max strength is fine   * Children- consult with Pharmacist for dosing 8. For fever or aces or pains- take tylenol or ibuprofen appropriate for age and weight.  * for fevers greater than 101 orally you may alternate ibuprofen and tylenol every  3 hours.   Mary-Margaret Hassell Done, FNP

## 2017-12-29 NOTE — Patient Instructions (Signed)

## 2018-01-04 ENCOUNTER — Ambulatory Visit: Payer: Medicare Other | Admitting: Pediatrics

## 2018-01-10 ENCOUNTER — Other Ambulatory Visit: Payer: Self-pay | Admitting: *Deleted

## 2018-01-10 DIAGNOSIS — I10 Essential (primary) hypertension: Secondary | ICD-10-CM

## 2018-01-10 MED ORDER — LISINOPRIL 5 MG PO TABS: 5.0000 mg | ORAL_TABLET | Freq: Every day | ORAL | 0 refills | Status: DC

## 2018-01-17 ENCOUNTER — Ambulatory Visit: Payer: Medicare Other | Admitting: Nurse Practitioner

## 2018-02-19 NOTE — Progress Notes (Deleted)
Ashley [name] Date of Birth: [birth year]   History of Present Illness: Ashley [name] is seen  for followup CAD.  She is status post stenting of the proximal LAD and first obtuse marginal vessels in February 2011 with bare-metal stents. She had a normal stress Echo in 3/15.   On follow up today she denies any chest pain or SOB. She is very active playing pickleball and dancing. She participates in 20 events in the [employer].  Reports she just had lab work with primary care and it was good.  No edema, palpitations, PND, or dizziness. Had colonoscopy in November and this was also OK.   Current Outpatient Medications on File Prior to Visit  Medication Sig Dispense Refill  . aspirin EC 81 MG tablet Take 81 mg by mouth at bedtime.    Marland Kitchen atorvastatin (LIPITOR) 20 MG tablet Take 1 tablet (20 mg total) by mouth at bedtime. 90 tablet 1  . benzonatate (TESSALON PERLES) 100 MG capsule Take 1 capsule (100 mg total) by mouth 3 (three) times daily as needed for cough. 20 capsule 0  . Calcium Carb-Cholecalciferol (OS-CAL CALCIUM + D3 PO) Take 1 tablet by mouth daily. Calcium 1200 mg with Vitamin D    . cholecalciferol (VITAMIN D) 1000 units tablet Take 1,000 Units by mouth daily.    . fluticasone (FLONASE) 50 MCG/ACT nasal spray Place 2 sprays into both nostrils daily. 16 g 0  . furosemide (LASIX) 20 MG tablet Take 1 tablet (20 mg total) by mouth daily. 90 tablet 1  . HYDROcodone-homatropine (HYCODAN) 5-1.5 MG/5ML syrup Take 5 mLs by mouth every 6 (six) hours as needed for cough. 120 mL 0  . lisinopril (PRINIVIL,ZESTRIL) 5 MG tablet Take 1 tablet (5 mg total) by mouth daily. Take 1 tab daily 90 tablet 0  . mupirocin ointment (BACTROBAN) 2 % Use twice a day on affected areas 30 g 1  . Omega-3 Fatty Acids (FISH OIL) 1200 MG CAPS Take 1 capsule by mouth every morning.    . predniSONE (DELTASONE) 20 MG tablet 2 po at sametime daily for 5 days 10 tablet 0  . Psyllium (METAMUCIL PO) Take one (1) teaspoon by  mouth daily at bedtime.    . pyridoxine (B-6) 200 MG tablet Take 200 mg daily by mouth.    . vitamin B-12 (CYANOCOBALAMIN) 1000 MCG tablet Take 1,000 mcg by mouth daily.     No current facility-administered medications on file prior to visit.     Allergies  Allergen Reactions  . Demerol Nausea And Vomiting  . Moviprep [Peg-Kcl-Nacl-Nasulf-Na Asc-C] Nausea And Vomiting  . Declomycin [Demeclocycline] Rash    Past Medical History:  Diagnosis Date  . Coronary artery disease 01/08/10   STATUS POST STENTING OF THE LAD AND THE FIRST OBTUSE MARGINAL VESSEL  . Diverticulitis   . Headache(784.0)   . Heart disease   . Herpes zoster   . Hypercholesterolemia   . Hyperlipidemia   . Phlebitis Right   Leg  . PONV (postoperative nausea and vomiting)   . Stomach ulcer     Past Surgical History:  Procedure Laterality Date  . ABDOMINAL HYSTERECTOMY     with bladder tack  . BREAST BIOPSY Left    Normal  . Bunionectomy Right   . CARDIAC CATHETERIZATION  01/08/10  . CARDIAC CATHETERIZATION  01/05/10   NORMAL LEFT VENTRICULAR SIZE AND NORMAL SYSTOLIC FUNCTION. EF IS 60%.   Marland Kitchen CATARACT EXTRACTION    . COLONOSCOPY  07/13/2011  RMR:Lax anal sphincter tone otherwise normal/left sided diverticula, polyp removed but not analyzed  . COLONOSCOPY N/A 09/21/2016   Procedure: COLONOSCOPY;  Surgeon: Ashley Dolin, MD;  Location: [facility];  Service: Endoscopy;  Laterality: N/A;  1:00 pm  . CORONARY STENT PLACEMENT    . VAGINAL HYSTERECTOMY    . VEIN LIGATION      Social History   Tobacco Use  Smoking Status Never Smoker  Smokeless Tobacco Never Used    Social History   Substance and Sexual Activity  Alcohol Use Yes   Comment: Socially    Family History  Problem Relation Age of Onset  . Emphysema Father   . Colon cancer Father   . Lupus Child   . Heart disease Sister   . Heart disease Paternal Grandmother   . Hypertension Paternal Grandmother   . Heart disease Maternal Grandmother    . Hypertension Maternal Grandmother     Review of Systems: As noted in history of present illness.  All other systems were reviewed and are negative.  Physical Exam: There were no vitals taken for this visit. She is a pleasant white female who is in no distress. HEENT exam is unremarkable. She has no JVD or bruits. Lungs are clear. Cardiac exam reveals a regular rate and rhythm without gallop or murmur. Abdomen is soft and nontender. She has no edema. Pedal pulses are good. Neurologic exam is nonfocal.  LABORATORY DATA: Lab Results  Component Value Date   WBC 8.4 01/09/2010   HGB 12.0 01/09/2010   HCT 34.4 (L) 01/09/2010   PLT 189 01/09/2010   GLUCOSE 82 06/09/2017   CHOL 168 06/09/2017   TRIG 69 06/09/2017   HDL 62 06/09/2017   LDLCALC 92 06/09/2017   ALT 26 06/09/2017   AST 27 06/09/2017   NA 141 06/09/2017   K 4.1 06/09/2017   CL 102 06/09/2017   CREATININE 0.76 06/09/2017   BUN 14 06/09/2017   CO2 25 06/09/2017   INR 0.93 01/08/2010    ECG today is normal. Rate 64.  I have personally reviewed and interpreted this study.   Assessment / Plan: 1. Coronary disease status post 2 vessel stenting with bare-metal stents 2011. Normal stress Echo in March 2015,  She remains asymptomatic. Continue with aspirin therapy. Continue on statin with Lipitor.   2. Hypercholesterolemia. On  Lipitor. Labs followed by primary care. Continue  Mediterranean style diet.   I will follow up in 1 year.

## 2018-02-20 ENCOUNTER — Ambulatory Visit: Payer: Medicare Other | Admitting: Cardiology

## 2018-02-28 ENCOUNTER — Encounter: Payer: Self-pay | Admitting: Pediatrics

## 2018-02-28 ENCOUNTER — Ambulatory Visit: Payer: Medicare Other | Admitting: Pediatrics

## 2018-02-28 VITALS — BP 129/64 | HR 70 | Temp 97.5°F | Ht 61.0 in | Wt 141.4 lb

## 2018-02-28 DIAGNOSIS — R32 Unspecified urinary incontinence: Secondary | ICD-10-CM

## 2018-02-28 DIAGNOSIS — I10 Essential (primary) hypertension: Secondary | ICD-10-CM | POA: Diagnosis not present

## 2018-02-28 DIAGNOSIS — L570 Actinic keratosis: Secondary | ICD-10-CM | POA: Diagnosis not present

## 2018-02-28 DIAGNOSIS — E7800 Pure hypercholesterolemia, unspecified: Secondary | ICD-10-CM

## 2018-02-28 DIAGNOSIS — R319 Hematuria, unspecified: Secondary | ICD-10-CM

## 2018-02-28 LAB — URINALYSIS, COMPLETE
BILIRUBIN UA: NEGATIVE
Glucose, UA: NEGATIVE
KETONES UA: NEGATIVE
Leukocytes, UA: NEGATIVE
NITRITE UA: NEGATIVE
Protein, UA: NEGATIVE
SPEC GRAV UA: 1.01 (ref 1.005–1.030)
UUROB: 0.2 mg/dL (ref 0.2–1.0)
pH, UA: 6.5 (ref 5.0–7.5)

## 2018-02-28 LAB — MICROSCOPIC EXAMINATION: Renal Epithel, UA: NONE SEEN /hpf

## 2018-02-28 MED ORDER — FUROSEMIDE 20 MG PO TABS: 20.0000 mg | ORAL_TABLET | Freq: Every day | ORAL | 1 refills | Status: DC

## 2018-02-28 MED ORDER — LISINOPRIL 5 MG PO TABS: 5.0000 mg | ORAL_TABLET | Freq: Every day | ORAL | 1 refills | Status: DC

## 2018-02-28 MED ORDER — ATORVASTATIN CALCIUM 20 MG PO TABS: 20.0000 mg | ORAL_TABLET | Freq: Every day | ORAL | 1 refills | Status: DC

## 2018-02-28 NOTE — Progress Notes (Signed)
  Subjective:   Patient ID: [name], female    DOB: 1946/02/13, 72 y.o.   MRN: [id number] CC: Follow-up (6 month) Multiple medical problems. HPI: ZORINA MALLIN is a 72 y.o. female presenting for Follow-up (6 month)  Actinic keratosis: Treated last visit.  Nose bridge 75m patch slightly less rough.  Hyperlipidemia: Tolerating statin  Hypertension: Taking medicine regularly.  Urinary incontinence: Has had a bladder tack in the past.  Had an appointment with urology, canceled the appointment.  She is not sure she wants to follow-up with them regarding the incontinence or not.  Hematuria: Multiple UAs over the last 6 months with hematuria, done in setting of infection.  Relevant past medical, surgical, family and social history reviewed. Allergies and medications reviewed and updated. Social History   Tobacco Use  Smoking Status Never Smoker  Smokeless Tobacco Never Used   ROS: Per HPI   Objective:    BP 129/64   Pulse 70   Temp (!) 97.5 F (36.4 C) (Oral)   Ht '5\' 1"'$  (1.549 m)   Wt 141 lb 6.4 oz (64.1 kg)   BMI 26.72 kg/m   Wt Readings from Last 3 Encounters:  02/28/18 141 lb 6.4 oz (64.1 kg)  12/29/17 139 lb (63 kg)  12/15/17 143 lb 9.6 oz (65.1 kg)    Gen: NAD, alert, cooperative with exam, NCAT EYES: EOMI, no conjunctival injection, or no icterus CV: NRRR, normal S1/S2, no murmur, distal pulses 2+ b/l Resp: CTABL, no wheezes, normal WOB Abd: +BS, soft, NTND. no guarding or organomegaly Ext: No edema, warm Neuro: Alert and oriented, strength equal b/l UE and LE, coordination grossly normal MSK: normal muscle bulk Skin: 1 mm rough hyperkeratotic patch over nose bridge  Assessment & Plan:  CJaydwas seen today for follow-up multiple medical problems.  Diagnoses and all orders for this visit:  Hematuria, unspecified type Repeat UA to show resolution of hematuria given multiple recent UA is positive for hematuria -     Urinalysis,  Complete  Hypercholesterolemia Stable, continue below -     atorvastatin (LIPITOR) 20 MG tablet; Take 1 tablet (20 mg total) by mouth at bedtime. -     Lipid panel; Future  Essential hypertension Stable, continue below -     furosemide (LASIX) 20 MG tablet; Take 1 tablet (20 mg total) by mouth daily. -     lisinopril (PRINIVIL,ZESTRIL) 5 MG tablet; Take 1 tablet (5 mg total) by mouth daily. Take 1 tab daily -     BMP8+EGFR; Future  AK (actinic keratosis) Discussed options, will proceed with treatment by cryotherapy today  Urinary incontinence, unspecified type Discussed options, patient not interested at this time in pursuing further treatment  Lesion destruction: Discussed risks, benefits and alternatives, pt agreed to proceed. Liquid nitrogen used AK on nose bridge.   Follow up plan: Return in about 6 months (around 08/30/2018). CAssunta Found MD [place]

## 2018-03-05 ENCOUNTER — Other Ambulatory Visit: Payer: Self-pay | Admitting: Pediatrics

## 2018-03-05 DIAGNOSIS — R319 Hematuria, unspecified: Secondary | ICD-10-CM

## 2018-03-19 ENCOUNTER — Telehealth: Payer: Self-pay | Admitting: Pediatrics

## 2018-03-19 NOTE — Telephone Encounter (Signed)
What type of referral do you need? urologist  Have you been seen at our office for this problem? yes (If no, schedule them an appointment.  They will need to be seen before a referral can be done.)  Is there a particular doctor or location that you prefer? Did not state if she did   Patient notified that referrals can take up to a week or longer to process. If they haven't heard anything within a week they should call back and speak with the referral department.

## 2018-04-24 ENCOUNTER — Other Ambulatory Visit: Payer: Self-pay | Admitting: Pediatrics

## 2018-04-24 DIAGNOSIS — Z1231 Encounter for screening mammogram for malignant neoplasm of breast: Secondary | ICD-10-CM

## 2018-05-09 ENCOUNTER — Ambulatory Visit: Payer: Medicare Other | Admitting: Adult Health

## 2018-05-29 ENCOUNTER — Other Ambulatory Visit (HOSPITAL_COMMUNITY)
Admission: AD | Admit: 2018-05-29 | Discharge: 2018-05-29 | Disposition: A | Discharge status: Home / Self Care | Payer: Medicare Other | Source: Skilled Nursing Facility | Attending: Urology | Admitting: Urology

## 2018-05-29 ENCOUNTER — Ambulatory Visit: Payer: Medicare Other | Admitting: Urology

## 2018-05-29 DIAGNOSIS — R311 Benign essential microscopic hematuria: Secondary | ICD-10-CM

## 2018-05-29 DIAGNOSIS — R32 Unspecified urinary incontinence: Secondary | ICD-10-CM

## 2018-05-29 DIAGNOSIS — R3915 Urgency of urination: Secondary | ICD-10-CM | POA: Diagnosis not present

## 2018-05-29 LAB — URINALYSIS, ROUTINE W REFLEX MICROSCOPIC
Bilirubin Urine: NEGATIVE
GLUCOSE, UA: NEGATIVE mg/dL
HGB URINE DIPSTICK: NEGATIVE
KETONES UR: NEGATIVE mg/dL
Leukocytes, UA: NEGATIVE
Nitrite: NEGATIVE
PROTEIN: NEGATIVE mg/dL
Specific Gravity, Urine: 1.017 (ref 1.005–1.030)
pH: 6 (ref 5.0–8.0)

## 2018-05-31 ENCOUNTER — Ambulatory Visit (HOSPITAL_COMMUNITY)
Admission: RE | Admit: 2018-05-31 | Discharge: 2018-05-31 | Disposition: A | Discharge status: Home / Self Care | Payer: Medicare Other | Source: Ambulatory Visit | Attending: Pediatrics | Admitting: Pediatrics

## 2018-05-31 DIAGNOSIS — Z1231 Encounter for screening mammogram for malignant neoplasm of breast: Secondary | ICD-10-CM

## 2018-06-15 ENCOUNTER — Ambulatory Visit: Payer: Medicare Other | Admitting: Family Medicine

## 2018-06-15 ENCOUNTER — Encounter: Payer: Self-pay | Admitting: Family Medicine

## 2018-06-15 VITALS — BP 133/62 | HR 76 | Temp 98.4°F | Ht 61.0 in | Wt 141.2 lb

## 2018-06-15 DIAGNOSIS — R05 Cough: Secondary | ICD-10-CM

## 2018-06-15 DIAGNOSIS — R059 Cough, unspecified: Secondary | ICD-10-CM

## 2018-06-15 MED ORDER — BENZONATATE 100 MG PO CAPS: 200.0000 mg | ORAL_CAPSULE | Freq: Two times a day (BID) | ORAL | 1 refills | Status: DC

## 2018-06-15 NOTE — Progress Notes (Signed)
BP 133/62   Pulse 76   Temp 98.4 F (36.9 C) (Oral)   Ht 5\' 1"  (1.549 m)   Wt 141 lb 3.2 oz (64 kg)   SpO2 97%   BMI 26.68 kg/m    Subjective:    Patient ID: Ashley [name], female    DOB: [birth year], 72 y.o.   MRN: [id number]  HPI: Ashley [name] is a 72 y.o. female presenting on 06/15/2018 for dry cough (x 3 days)   HPI Cough and congestion Patient cough and congestion is been going on for 3 days.  She complains of coughing a dry cough that is nonproductive.  She denies any sinus pressure or shortness of breath and wheezing.  She states that it has been increasing over the past few days and she has been keeping her up at night sometimes with a cough.  She just wants to get ahead of it before it turns into anything more real.  Relevant past medical, surgical, family and social history reviewed and updated as indicated. Interim medical history since our last visit reviewed. Allergies and medications reviewed and updated.  Review of Systems  Constitutional: Negative for chills and fever.  HENT: Positive for congestion. Negative for ear discharge, ear pain, sinus pressure, sinus pain, sneezing and sore throat.   Eyes: Negative for visual disturbance.  Respiratory: Positive for cough. Negative for chest tightness, shortness of breath and wheezing.   Cardiovascular: Negative for chest pain and leg swelling.  Musculoskeletal: Negative for back pain and gait problem.  Skin: Negative for rash.  Neurological: Negative for light-headedness and headaches.  Psychiatric/Behavioral: Negative for agitation and behavioral problems.  All other systems reviewed and are negative.   Per HPI unless specifically indicated above   Allergies as of 06/15/2018      Reactions   Demerol Nausea And Vomiting   Moviprep [peg-kcl-nacl-nasulf-na Asc-c] Nausea And Vomiting   Declomycin [demeclocycline] Rash      Medication List        Accurate as of 06/15/18  3:24 PM. Always use your most recent med  list.          aspirin EC 81 MG tablet Take 81 mg by mouth at bedtime.   atorvastatin 20 MG tablet Commonly known as:  LIPITOR Take 1 tablet (20 mg total) by mouth at bedtime.   benzonatate 100 MG capsule Commonly known as:  TESSALON PERLES Take 2 capsules (200 mg total) by mouth 2 (two) times daily.   cholecalciferol 1000 units tablet Commonly known as:  VITAMIN D Take 1,000 Units by mouth daily.   Fish Oil 1200 MG Caps Take 1 capsule by mouth every morning.   fluticasone 50 MCG/ACT nasal spray Commonly known as:  FLONASE Place 2 sprays into both nostrils daily.   furosemide 20 MG tablet Commonly known as:  LASIX Take 1 tablet (20 mg total) by mouth daily.   lisinopril 5 MG tablet Commonly known as:  PRINIVIL,ZESTRIL Take 1 tablet (5 mg total) by mouth daily. Take 1 tab daily   METAMUCIL PO Take one (1) teaspoon by mouth daily at bedtime.   mupirocin ointment 2 % Commonly known as:  BACTROBAN Use twice a day on affected areas   OS-CAL CALCIUM + D3 PO Take 1 tablet by mouth daily. Calcium 1200 mg with Vitamin D   pyridoxine 200 MG tablet Commonly known as:  B-6 Take 200 mg daily by mouth.   vitamin B-12 1000 MCG tablet Commonly known as:  CYANOCOBALAMIN Take  1,000 mcg by mouth daily.          Objective:    BP 133/62   Pulse 76   Temp 98.4 F (36.9 C) (Oral)   Ht 5\' 1"  (1.549 m)   Wt 141 lb 3.2 oz (64 kg)   SpO2 97%   BMI 26.68 kg/m   Wt Readings from Last 3 Encounters:  06/15/18 141 lb 3.2 oz (64 kg)  02/28/18 141 lb 6.4 oz (64.1 kg)  12/29/17 139 lb (63 kg)    Physical Exam  Constitutional: She is oriented to person, place, and time. She appears well-developed and well-nourished. No distress.  Eyes: Conjunctivae are normal.  Cardiovascular: Normal rate, regular rhythm, normal heart sounds and intact distal pulses.  No murmur heard. Pulmonary/Chest: Effort normal and breath sounds normal. No respiratory distress. She has no wheezes. She  has no rales.  Musculoskeletal: Normal range of motion.  Neurological: She is alert and oriented to person, place, and time. Coordination normal.  Skin: Skin is warm and dry. No rash noted. She is not diaphoretic.  Psychiatric: She has a normal mood and affect. Her behavior is normal.  Nursing note and vitals reviewed.       Assessment & Plan:   Problem List Items Addressed This Visit    None    Visit Diagnoses    Cough    -  Primary   Relevant Medications   benzonatate (TESSALON PERLES) 100 MG capsule       Follow up plan: Return if symptoms worsen or fail to improve.  Counseling provided for all of the vaccine components No orders of the defined types were placed in this encounter.   Caryl Pina, MD [facility] 06/15/2018, 3:24 PM

## 2018-06-20 ENCOUNTER — Telehealth: Payer: Self-pay | Admitting: Pediatrics

## 2018-06-20 NOTE — Telephone Encounter (Signed)
Please review and advise.

## 2018-06-20 NOTE — Telephone Encounter (Signed)
Left message to call back  

## 2018-06-20 NOTE — Telephone Encounter (Signed)
Any shortness of breath or wheezing she should be seen.  Can try sinus rinses to get the mucus out of her sinuses, use distilled water in the salt water packets, with a Nettie pot or similar.  Try to do twice a day.

## 2018-07-03 NOTE — Telephone Encounter (Signed)
Patient states that she no longer is having these symptoms

## 2018-09-14 ENCOUNTER — Other Ambulatory Visit: Payer: Self-pay | Admitting: Pediatrics

## 2018-09-14 DIAGNOSIS — E7800 Pure hypercholesterolemia, unspecified: Secondary | ICD-10-CM

## 2018-09-14 NOTE — Telephone Encounter (Signed)
Last lipid 02/28/18

## 2018-10-16 ENCOUNTER — Other Ambulatory Visit: Payer: Self-pay | Admitting: Pediatrics

## 2018-10-16 DIAGNOSIS — I10 Essential (primary) hypertension: Secondary | ICD-10-CM

## 2018-11-21 ENCOUNTER — Other Ambulatory Visit: Payer: Self-pay | Admitting: Pediatrics

## 2018-11-21 DIAGNOSIS — I10 Essential (primary) hypertension: Secondary | ICD-10-CM

## 2018-11-22 NOTE — Telephone Encounter (Signed)
Vincent. NTBS 30 days given 10/16/18

## 2018-11-23 ENCOUNTER — Other Ambulatory Visit: Payer: Self-pay | Admitting: Pediatrics

## 2018-11-23 DIAGNOSIS — I10 Essential (primary) hypertension: Secondary | ICD-10-CM

## 2018-11-23 NOTE — Telephone Encounter (Signed)
lmtcb-cb 11/23/2018

## 2018-11-26 NOTE — Telephone Encounter (Signed)
Ashley [name]. NTBS 30 days given 10/16/18

## 2018-11-28 ENCOUNTER — Other Ambulatory Visit: Payer: Self-pay | Admitting: *Deleted

## 2018-11-28 DIAGNOSIS — I10 Essential (primary) hypertension: Secondary | ICD-10-CM

## 2018-11-28 MED ORDER — LISINOPRIL 5 MG PO TABS
5.0000 mg | ORAL_TABLET | Freq: Every day | ORAL | 0 refills | Status: DC
Start: 2018-11-28 — End: 2018-12-11

## 2018-12-11 ENCOUNTER — Ambulatory Visit: Payer: Medicare Other | Admitting: Family Medicine

## 2018-12-11 VITALS — BP 137/58 | HR 74 | Temp 98.5°F | Ht 61.0 in | Wt 142.0 lb

## 2018-12-11 DIAGNOSIS — I10 Essential (primary) hypertension: Secondary | ICD-10-CM

## 2018-12-11 DIAGNOSIS — E7800 Pure hypercholesterolemia, unspecified: Secondary | ICD-10-CM

## 2018-12-11 DIAGNOSIS — Z1159 Encounter for screening for other viral diseases: Secondary | ICD-10-CM

## 2018-12-11 DIAGNOSIS — I2510 Atherosclerotic heart disease of native coronary artery without angina pectoris: Secondary | ICD-10-CM | POA: Diagnosis not present

## 2018-12-11 DIAGNOSIS — L570 Actinic keratosis: Secondary | ICD-10-CM | POA: Diagnosis not present

## 2018-12-11 DIAGNOSIS — I2583 Coronary atherosclerosis due to lipid rich plaque: Secondary | ICD-10-CM

## 2018-12-11 DIAGNOSIS — Z130 Encounter for screening for diseases of the blood and blood-forming organs and certain disorders involving the immune mechanism: Secondary | ICD-10-CM

## 2018-12-11 HISTORY — DX: Essential (primary) hypertension: I10

## 2018-12-11 MED ORDER — FUROSEMIDE 20 MG PO TABS: 20.0000 mg | ORAL_TABLET | Freq: Every day | ORAL | 3 refills | Status: DC

## 2018-12-11 MED ORDER — LISINOPRIL 5 MG PO TABS: 5.0000 mg | ORAL_TABLET | Freq: Every day | ORAL | 3 refills | Status: DC

## 2018-12-11 MED ORDER — ATORVASTATIN CALCIUM 20 MG PO TABS: 20.0000 mg | ORAL_TABLET | Freq: Every day | ORAL | 3 refills | Status: DC

## 2018-12-11 NOTE — Progress Notes (Signed)
Subjective: CC: HTN, HLD, CAD, skin lesion PCP: Eustaquio Maize, MD Ashley [name] is a 73 y.o. female presenting to clinic today for:  1.  Hypertension with hyperlipidemia/coronary artery disease status post stent Patient reports compliance with lisinopril 5 mg, aspirin 81 mg, Lipitor 20 mg and Lasix 20 mg.  Past medical history is significant for coronary artery disease with 2 stents in place.  She sees Dr. Martinique and is due for an appointment soon with him.  She notes from a cardiac standpoint she has been doing well.  She denies any chest pain, shortness of breath, dizziness.  She does report chronic right lower extremity edema that has been present since she had vascular surgery within the right lower extremity.  Lasix seems to control this well.  2.  Skin lesion on nose Patient reports history of actinic keratosis along the bridge of her nose.  This was previously treated with cryotherapy.  She notes that it seems to be getting rough and coming to ahead again and she is wondering if it needs retreatment.  Denies similar skin lesions elsewhere.   ROS: Per HPI  Allergies  Allergen Reactions  . Demerol Nausea And Vomiting  . Moviprep [Peg-Kcl-Nacl-Nasulf-Na Asc-C] Nausea And Vomiting  . Declomycin [Demeclocycline] Rash   Past Medical History:  Diagnosis Date  . Coronary artery disease 01/08/10   STATUS POST STENTING OF THE LAD AND THE FIRST OBTUSE MARGINAL VESSEL  . Diverticulitis   . Headache(784.0)   . Heart disease   . Herpes zoster   . Hypercholesterolemia   . Hyperlipidemia   . Phlebitis Right   Leg  . PONV (postoperative nausea and vomiting)   . Stomach ulcer     Current Outpatient Medications:  .  aspirin EC 81 MG tablet, Take 81 mg by mouth at bedtime., Disp: , Rfl:  .  atorvastatin (LIPITOR) 20 MG tablet, TAKE ONE TABLET AT BEDTIME, Disp: 90 tablet, Rfl: 0 .  Calcium Carb-Cholecalciferol (OS-CAL CALCIUM + D3 PO), Take 1 tablet by mouth daily. Calcium 1200 mg  with Vitamin D, Disp: , Rfl:  .  cholecalciferol (VITAMIN D) 1000 units tablet, Take 1,000 Units by mouth daily., Disp: , Rfl:  .  fluticasone (FLONASE) 50 MCG/ACT nasal spray, Place 2 sprays into both nostrils daily., Disp: 16 g, Rfl: 0 .  furosemide (LASIX) 20 MG tablet, Take 1 tablet (20 mg total) by mouth daily., Disp: 90 tablet, Rfl: 1 .  lisinopril (PRINIVIL,ZESTRIL) 5 MG tablet, Take 1 tablet (5 mg total) by mouth daily., Disp: 30 tablet, Rfl: 0 .  mupirocin ointment (BACTROBAN) 2 %, Use twice a day on affected areas, Disp: 30 g, Rfl: 1 .  Omega-3 Fatty Acids (FISH OIL) 1200 MG CAPS, Take 1 capsule by mouth every morning., Disp: , Rfl:  .  Psyllium (METAMUCIL PO), Take one (1) teaspoon by mouth daily at bedtime., Disp: , Rfl:  .  pyridoxine (B-6) 200 MG tablet, Take 200 mg daily by mouth., Disp: , Rfl:  .  vitamin B-12 (CYANOCOBALAMIN) 1000 MCG tablet, Take 1,000 mcg by mouth daily., Disp: , Rfl:  Social History   Socioeconomic History  . Marital status: Married    Spouse name: Not on file  . Number of children: 5  . Years of education: Not on file  . Highest education level: Not on file  Occupational History  . Occupation: housework  Social Needs  . Financial resource strain: Not on file  . Food insecurity:  Worry: Not on file    Inability: Not on file  . Transportation needs:    Medical: Not on file    Non-medical: Not on file  Tobacco Use  . Smoking status: Never Smoker  . Smokeless tobacco: Never Used  Substance and Sexual Activity  . Alcohol use: Yes    Comment: Socially  . Drug use: No  . Sexual activity: Yes    Birth control/protection: Surgical  Lifestyle  . Physical activity:    Days per week: Not on file    Minutes per session: Not on file  . Stress: Not on file  Relationships  . Social connections:    Talks on phone: Not on file    Gets together: Not on file    Attends religious service: Not on file    Active member of club or organization: Not on  file    Attends meetings of clubs or organizations: Not on file    Relationship status: Not on file  . Intimate partner violence:    Fear of current or ex partner: Not on file    Emotionally abused: Not on file    Physically abused: Not on file    Forced sexual activity: Not on file  Other Topics Concern  . Not on file  Social History Narrative   ** Merged History Encounter **       Family History  Problem Relation Age of Onset  . Emphysema Father   . Colon cancer Father   . Lupus Child   . Heart disease Sister   . Heart disease Paternal Grandmother   . Hypertension Paternal Grandmother   . Heart disease Maternal Grandmother   . Hypertension Maternal Grandmother     Objective: Office vital signs reviewed. BP (!) 137/58   Pulse 74   Temp 98.5 F (36.9 C) (Oral)   Ht _0  (1.549 m)   Wt 142 lb (64.4 kg)   BMI 26.83 kg/m   Physical Examination:  General: Awake, alert, well nourished, No acute distress HEENT: Normal. Sclera white, MMM Cardio: regular rate and rhythm, S1S2 heard, no murmurs appreciated Pulm: clear to auscultation bilaterally, no wheezes, rhonchi or rales; normal work of breathing on room air Extremities: warm, well perfused, 1+ pitting edema right LE to mid shin. No cyanosis or clubbing; +2 pulses bilaterally  Cryotherapy Procedure:  Risks and benefits of procedure were reviewed with the patient.  Written consent obtained and scanned into the chart.  Lesion of concern was identified and located on bridge of nose.  Liquid nitrogen was applied to area of concern and extending out 1 millimeters beyond the border of the lesion.  Treated area was allowed to come back to room temperature before treating it a second time.  Patient tolerated procedure well and there were no immediate complications.  Home care instructions were reviewed with the patient and a handout was provided.   Assessment/ Plan: 73 y.o. female   1. Essential hypertension Under good  control with current regimen.  No changes made.  Refill sent to pharmacy.  She will come in for fasting labs on Thursday.  CMP, lipid panel and CBC ordered. - furosemide (LASIX) 20 MG tablet; Take 1 tablet (20 mg total) by mouth daily.  Dispense: 90 tablet; Refill: 3 - lisinopril (PRINIVIL,ZESTRIL) 5 MG tablet; Take 1 tablet (5 mg total) by mouth daily.  Dispense: 90 tablet; Refill: 3 - CMP14+EGFR; Future  2. Coronary artery disease due to lipid rich plaque Continue to follow-up  with cardiology as scheduled  3. Hypercholesterolemia - atorvastatin (LIPITOR) 20 MG tablet; Take 1 tablet (20 mg total) by mouth at bedtime.  Dispense: 90 tablet; Refill: 3 - CMP14+EGFR; Future - Lipid Panel; Future  4. Actinic keratoses Treated today with cryotherapy.  Patient tolerated procedure well.  Handout provided.  Follow-up PRN.  5. Screening, anemia, deficiency, iron - CBC; Future  6. Encounter for hepatitis C screening test for low risk patient - Hepatitis C antibody; Future   Orders Placed This Encounter  Procedures  . CMP14+EGFR    Standing Status:   Future    Standing Expiration Date:   12/12/2019  . Lipid Panel    Standing Status:   Future    Standing Expiration Date:   12/12/2019  . CBC    Standing Status:   Future    Standing Expiration Date:   12/12/2019  . Hepatitis C antibody    Standing Status:   Future    Standing Expiration Date:   12/12/2019   Meds ordered this encounter  Medications  . atorvastatin (LIPITOR) 20 MG tablet    Sig: Take 1 tablet (20 mg total) by mouth at bedtime.    Dispense:  90 tablet    Refill:  3    $  . furosemide (LASIX) 20 MG tablet    Sig: Take 1 tablet (20 mg total) by mouth daily.    Dispense:  90 tablet    Refill:  3  . lisinopril (PRINIVIL,ZESTRIL) 5 MG tablet    Sig: Take 1 tablet (5 mg total) by mouth daily.    Dispense:  90 tablet    Refill:  3    $     Janora Norlander, DO [facility] [phone]

## 2018-12-11 NOTE — Patient Instructions (Signed)
Come in for fasting labs at your convenience.  Cryosurgery for Skin Conditions, Care After This sheet gives you information about how to care for yourself after your procedure. Your health care provider may also give you more specific instructions. If you have problems or questions, contact your health care provider. What can I expect after the procedure? After your procedure, it is common to have redness, swelling, and a blister that forms over the treated area. The blister may contain a small amount of blood. After about 2 weeks, the blister will break on its own, leaving a scab. Then the treated area will heal. After healing, there is usually little or no scarring. Follow these instructions at home: Caring for the treated area   Follow instructions from your health care provider about how to take care of the treated area. Make sure you: ? Keep the area covered with a bandage (dressing) until it heals, or for as long as told by your health care provider. ? Wash your hands with soap and water before you change your dressing. If soap and water are not available, use hand sanitizer. ? Change your dressing as told by your health care provider. ? Keep the dressing and the treated area clean and dry. If the dressing gets wet, change it right away. ? Clean the treated area with soap and water.  Check the treated area every day for signs of infection. Check for: ? More redness, swelling, or pain. ? More fluid or blood. ? Warmth. ? Pus or a bad smell. General instructions  Do not pick at your blister or try to break it open. This can cause infection and scarring.  Do not apply any medicine, cream, or lotion to the treated area unless directed by your health care provider.  Take over-the-counter and prescription medicines only as told by your health care provider.  Keep all follow-up visits as told by your health care provider. This is important. Contact a health care provider if:  You have  more redness, swelling, or pain around the treated area.  You have more fluid or blood coming from the treated area.  The treated area feels warm to the touch.  You have pus or a bad smell coming from the treated area.  Your blister becomes large and painful. Get help right away if:  You have a fever and have redness spreading from the treated area. Summary  The treated area will become red and swollen shortly after the procedure.  You should keep the treated area and your dressing clean and dry.  Check the treated area every day for signs of infection, such as fluid, pus, warmth, or having more redness, swelling, or pain.  Do not pick at your blister or try to break it open. This information is not intended to replace advice given to you by your health care provider. Make sure you discuss any questions you have with your health care provider. Document Released: 05/20/2005 Document Revised: 09/19/2016 Document Reviewed: 09/19/2016 Elsevier Interactive Patient Education  2019 [employer].

## 2018-12-12 ENCOUNTER — Ambulatory Visit: Payer: Medicare Other | Admitting: Pediatrics

## 2018-12-13 ENCOUNTER — Other Ambulatory Visit: Payer: Medicare Other

## 2018-12-13 DIAGNOSIS — Z130 Encounter for screening for diseases of the blood and blood-forming organs and certain disorders involving the immune mechanism: Secondary | ICD-10-CM

## 2018-12-13 DIAGNOSIS — I10 Essential (primary) hypertension: Secondary | ICD-10-CM

## 2018-12-13 DIAGNOSIS — E7800 Pure hypercholesterolemia, unspecified: Secondary | ICD-10-CM

## 2018-12-13 DIAGNOSIS — Z1159 Encounter for screening for other viral diseases: Secondary | ICD-10-CM

## 2018-12-14 LAB — HEPATITIS C ANTIBODY: Hep C Virus Ab: <0.1 s/co ratio (ref 0.0–0.9)

## 2018-12-14 LAB — CMP14+EGFR
ALK PHOS: 58 IU/L (ref 39–117)
ALT: 24 IU/L (ref 0–32)
AST: 28 IU/L (ref 0–40)
Albumin/Globulin Ratio: 2.3 — ABNORMAL HIGH (ref 1.2–2.2)
Albumin: 4.5 g/dL (ref 3.7–4.7)
BILIRUBIN TOTAL: 0.6 mg/dL (ref 0.0–1.2)
BUN/Creatinine Ratio: 13 (ref 12–28)
BUN: 10 mg/dL (ref 8–27)
CHLORIDE: 105 mmol/L (ref 96–106)
CO2: 24 mmol/L (ref 20–29)
CREATININE: 0.78 mg/dL (ref 0.57–1.00)
Calcium: 9.1 mg/dL (ref 8.7–10.3)
GFR calc Af Amer: 87 mL/min/{1.73_m2} (ref >59)
GFR calc non Af Amer: 75 mL/min/{1.73_m2} (ref >59)
Globulin, Total: 2 g/dL (ref 1.5–4.5)
Glucose: 92 mg/dL (ref 65–99)
Potassium: 4.7 mmol/L (ref 3.5–5.2)
Sodium: 144 mmol/L (ref 134–144)
Total Protein: 6.5 g/dL (ref 6.0–8.5)

## 2018-12-14 LAB — LIPID PANEL
CHOL/HDL RATIO: 2.7 ratio (ref 0.0–4.4)
Cholesterol, Total: 169 mg/dL (ref 100–199)
HDL: 63 mg/dL (ref >39)
LDL CALC: 90 mg/dL (ref 0–99)
Triglycerides: 78 mg/dL (ref 0–149)
VLDL CHOLESTEROL CAL: 16 mg/dL (ref 5–40)

## 2018-12-14 LAB — CBC
HEMATOCRIT: 38.1 % (ref 34.0–46.6)
Hemoglobin: 12.7 g/dL (ref 11.1–15.9)
MCH: 29.9 pg (ref 26.6–33.0)
MCHC: 33.3 g/dL (ref 31.5–35.7)
MCV: 90 fL (ref 79–97)
Platelets: 203 10*3/uL (ref 150–450)
RBC: 4.25 x10E6/uL (ref 3.77–5.28)
RDW: 12.1 % (ref 11.7–15.4)
WBC: 6.7 10*3/uL (ref 3.4–10.8)

## 2018-12-16 NOTE — Progress Notes (Signed)
Ashley [name] Date of Birth: June 17, 1946   History of Present Illness: Ashley [name] is seen  for followup CAD.  She is status post stenting of the proximal LAD and first obtuse marginal vessels in February 2011 with bare-metal stents. She had a normal stress Echo in 3/15.   On follow up today she is doing well. She has no chest pain or SOB. She is active playing shuffleboard and dancing. She participates in the [employer].  She has some chronic swelling in her right leg to remote vein procedure and subsequent infection.  Current Outpatient Medications on File Prior to Visit  Medication Sig Dispense Refill  . aspirin EC 81 MG tablet Take 81 mg by mouth at bedtime.    Marland Kitchen atorvastatin (LIPITOR) 20 MG tablet Take 1 tablet (20 mg total) by mouth at bedtime. 90 tablet 3  . Calcium Carb-Cholecalciferol (OS-CAL CALCIUM + D3 PO) Take 1 tablet by mouth daily. Calcium 1200 mg with Vitamin D    . cholecalciferol (VITAMIN D) 1000 units tablet Take 1,000 Units by mouth daily.    . furosemide (LASIX) 20 MG tablet Take 1 tablet (20 mg total) by mouth daily. 90 tablet 3  . lisinopril (PRINIVIL,ZESTRIL) 5 MG tablet Take 1 tablet (5 mg total) by mouth daily. 90 tablet 3  . Omega-3 Fatty Acids (FISH OIL) 1200 MG CAPS Take 1 capsule by mouth every morning.    . Psyllium (METAMUCIL PO) Take one (1) teaspoon by mouth daily at bedtime.    . pyridoxine (B-6) 200 MG tablet Take 200 mg daily by mouth.    . vitamin B-12 (CYANOCOBALAMIN) 1000 MCG tablet Take 1,000 mcg by mouth daily.     No current facility-administered medications on file prior to visit.     Allergies  Allergen Reactions  . Demerol Nausea And Vomiting  . Moviprep [Peg-Kcl-Nacl-Nasulf-Na Asc-C] Nausea And Vomiting  . Declomycin [Demeclocycline] Rash    Past Medical History:  Diagnosis Date  . Coronary artery disease 01/08/10   STATUS POST STENTING OF THE LAD AND THE FIRST OBTUSE MARGINAL VESSEL  . Diverticulitis   . Headache(784.0)   .  Heart disease   . Herpes zoster   . Hypercholesterolemia   . Hyperlipidemia   . Phlebitis Right   Leg  . PONV (postoperative nausea and vomiting)   . Stomach ulcer     Past Surgical History:  Procedure Laterality Date  . ABDOMINAL HYSTERECTOMY     with bladder tack  . BREAST BIOPSY Left    Normal  . Bunionectomy Right   . CARDIAC CATHETERIZATION  01/08/10  . CARDIAC CATHETERIZATION  01/05/10   NORMAL LEFT VENTRICULAR SIZE AND NORMAL SYSTOLIC FUNCTION. EF IS 60%.   Marland Kitchen CATARACT EXTRACTION    . COLONOSCOPY  07/13/2011   RMR:Lax anal sphincter tone otherwise normal/left sided diverticula, polyp removed but not analyzed  . COLONOSCOPY N/A 09/21/2016   Procedure: COLONOSCOPY;  Surgeon: Daneil Dolin, MD;  Location: [facility];  Service: Endoscopy;  Laterality: N/A;  1:00 pm  . CORONARY STENT PLACEMENT    . VAGINAL HYSTERECTOMY    . VEIN LIGATION      Social History   Tobacco Use  Smoking Status Never Smoker  Smokeless Tobacco Never Used    Social History   Substance and Sexual Activity  Alcohol Use Yes   Comment: Socially    Family History  Problem Relation Age of Onset  . Emphysema Father   . Colon cancer Father   .  Lupus Child   . Heart disease Sister   . Heart disease Paternal Grandmother   . Hypertension Paternal Grandmother   . Heart disease Maternal Grandmother   . Hypertension Maternal Grandmother     Review of Systems: As noted in history of present illness.  All other systems were reviewed and are negative.  Physical Exam: BP 128/62   Pulse 72   Ht 5\' 1"  (1.549 m)   Wt 145 lb 9.6 oz (66 kg)   BMI 27.51 kg/m  GENERAL:  Well appearing WF in NAD HEENT:  PERRL, EOMI, sclera are clear. Oropharynx is clear. NECK:  No jugular venous distention, carotid upstroke brisk and symmetric, no bruits, no thyromegaly or adenopathy LUNGS:  Clear to auscultation bilaterally CHEST:  Unremarkable HEART:  RRR,  PMI not displaced or sustained,S1 and S2 within normal  limits, no S3, no S4: no clicks, no rubs, no murmurs ABD:  Soft, nontender. BS +, no masses or bruits. No hepatomegaly, no splenomegaly EXT:  2 + pulses throughout, 1+ RLE edema, no cyanosis no clubbing SKIN:  Warm and dry.  No rashes NEURO:  Alert and oriented x 3. Cranial nerves II through XII intact. PSYCH:  Cognitively intact    LABORATORY DATA:  Lab Results  Component Value Date   WBC 6.7 12/13/2018   HGB 12.7 12/13/2018   HCT 38.1 12/13/2018   PLT 203 12/13/2018   GLUCOSE 92 12/13/2018   CHOL 169 12/13/2018   TRIG 78 12/13/2018   HDL 63 12/13/2018   LDLCALC 90 12/13/2018   ALT 24 12/13/2018   AST 28 12/13/2018   NA 144 12/13/2018   K 4.7 12/13/2018   CL 105 12/13/2018   CREATININE 0.78 12/13/2018   BUN 10 12/13/2018   CO2 24 12/13/2018   INR 0.93 01/08/2010    ECG today is normal. Rate 72.  I have personally reviewed and interpreted this study.   Assessment / Plan: 1. Coronary disease status post 2 vessel stenting with bare-metal stents 2011. Normal stress Echo in March 2015,  She is still asymptomatic. Continue with aspirin therapy. Continue on statin with Lipitor.   2. Hypercholesterolemia. On  Lipitor. Labs stable.   I will follow up in 1 year.

## 2018-12-18 ENCOUNTER — Ambulatory Visit: Payer: Medicare Other | Admitting: Cardiology

## 2018-12-18 ENCOUNTER — Encounter: Payer: Self-pay | Admitting: Cardiology

## 2018-12-18 VITALS — BP 128/62 | HR 72 | Ht 61.0 in | Wt 145.6 lb

## 2018-12-18 DIAGNOSIS — I2510 Atherosclerotic heart disease of native coronary artery without angina pectoris: Secondary | ICD-10-CM | POA: Diagnosis not present

## 2018-12-18 DIAGNOSIS — I2583 Coronary atherosclerosis due to lipid rich plaque: Secondary | ICD-10-CM

## 2018-12-18 DIAGNOSIS — E7800 Pure hypercholesterolemia, unspecified: Secondary | ICD-10-CM | POA: Diagnosis not present

## 2019-04-19 ENCOUNTER — Other Ambulatory Visit: Payer: Self-pay

## 2019-04-19 ENCOUNTER — Ambulatory Visit: Payer: Medicare Other | Admitting: Family Medicine

## 2019-04-19 ENCOUNTER — Encounter: Payer: Self-pay | Admitting: Family Medicine

## 2019-04-19 VITALS — BP 149/55 | HR 70 | Temp 97.8°F | Ht 61.0 in | Wt 139.6 lb

## 2019-04-19 DIAGNOSIS — W57XXXA Bitten or stung by nonvenomous insect and other nonvenomous arthropods, initial encounter: Secondary | ICD-10-CM

## 2019-04-19 DIAGNOSIS — M7989 Other specified soft tissue disorders: Secondary | ICD-10-CM | POA: Diagnosis not present

## 2019-04-19 MED ORDER — TRIAMCINOLONE ACETONIDE 0.1 % EX CREA: 1.0000 "application " | TOPICAL_CREAM | Freq: Two times a day (BID) | CUTANEOUS | 0 refills | Status: DC

## 2019-04-19 NOTE — Progress Notes (Signed)
BP (!) 149/55   Pulse 70   Temp 97.8 F (36.6 C) (Oral)   Ht 5\' 1"  (1.549 m)   Wt 139 lb 9.6 oz (63.3 kg)   BMI 26.38 kg/m    Subjective:   Patient ID: Ashley [name], female    DOB: 10/16/1946, 73 y.o.   MRN: [id number]  HPI: Ashley [name] is a 73 y.o. female presenting on 04/19/2019 for Foot Swelling (right- patient states she got a bug bite x 1 week ago and has been swelling )   HPI Right foot swelling, patient normally gets right foot swelling but is slightly more than normal especially right around where the bug bite that she sustained a week ago.  She says is not going down as much as it normally did.  Patient denies any fevers or chills or redness or warmth anywhere else.  She does not know what bit her and she did not see it.  Looks like her renal function in January was normal.  Has any calf pain or swelling of her legs but just mainly on the lateral aspect of her foot right around without bug bite is.  She denies any chest pain or trouble breathing.  Relevant past medical, surgical, family and social history reviewed and updated as indicated. Interim medical history since our last visit reviewed. Allergies and medications reviewed and updated.  Review of Systems  Constitutional: Negative for chills and fever.  Eyes: Negative for visual disturbance.  Respiratory: Negative for chest tightness and shortness of breath.   Cardiovascular: Negative for chest pain and leg swelling.  Musculoskeletal: Negative for back pain and gait problem.  Skin: Negative for rash.  Neurological: Negative for light-headedness and headaches.  Psychiatric/Behavioral: Negative for agitation and behavioral problems.  All other systems reviewed and are negative.   Per HPI unless specifically indicated above   Allergies as of 04/19/2019      Reactions   Demerol Nausea And Vomiting   Moviprep [peg-kcl-nacl-nasulf-na Asc-c] Nausea And Vomiting   Declomycin [demeclocycline] Rash      Medication  List       Accurate as of April 19, 2019  4:42 PM. If you have any questions, ask your nurse or doctor.        aspirin EC 81 MG tablet Take 81 mg by mouth at bedtime.   atorvastatin 20 MG tablet Commonly known as:  LIPITOR Take 1 tablet (20 mg total) by mouth at bedtime.   cholecalciferol 1000 units tablet Commonly known as:  VITAMIN D Take 1,000 Units by mouth daily.   Fish Oil 1200 MG Caps Take 1 capsule by mouth every morning.   furosemide 20 MG tablet Commonly known as:  Lasix Take 1 tablet (20 mg total) by mouth daily.   lisinopril 5 MG tablet Commonly known as:  ZESTRIL Take 1 tablet (5 mg total) by mouth daily.   METAMUCIL PO Take one (1) teaspoon by mouth daily at bedtime.   OS-CAL CALCIUM + D3 PO Take 1 tablet by mouth daily. Calcium 1200 mg with Vitamin D   pyridoxine 200 MG tablet Commonly known as:  B-6 Take 200 mg daily by mouth.   triamcinolone cream 0.1 % Commonly known as:  KENALOG Apply 1 application topically 2 (two) times daily. Started by:  Fransisca Kaufmann , MD   vitamin B-12 1000 MCG tablet Commonly known as:  CYANOCOBALAMIN Take 1,000 mcg by mouth daily.        Objective:   BP Marland Kitchen)  149/55   Pulse 70   Temp 97.8 F (36.6 C) (Oral)   Ht 5\' 1"  (1.549 m)   Wt 139 lb 9.6 oz (63.3 kg)   BMI 26.38 kg/m   Wt Readings from Last 3 Encounters:  04/19/19 139 lb 9.6 oz (63.3 kg)  12/18/18 145 lb 9.6 oz (66 kg)  12/11/18 142 lb (64.4 kg)    Physical Exam Vitals signs and nursing note reviewed.  Constitutional:      General: She is not in acute distress.    Appearance: She is well-developed. She is not diaphoretic.  Eyes:     Conjunctiva/sclera: Conjunctivae normal.  Musculoskeletal: Normal range of motion.        General: Swelling (Right foot swelling) present. No tenderness.  Skin:    General: Skin is warm and dry.     Findings: Rash (Small papular and lesion consistent with arthropod bite on lateral ankle of right foot) present.   Neurological:     Mental Status: She is alert and oriented to person, place, and time.     Coordination: Coordination normal.  Psychiatric:        Behavior: Behavior normal.       Assessment & Plan:   Problem List Items Addressed This Visit    None    Visit Diagnoses    Swelling of right foot    -  Primary   Relevant Orders   US Venous Img Lower Unilateral Right   Bug bite, initial encounter       No signs of immediate infection, recommend Benadryl and triamcinolone and a triple antibiotic   Relevant Medications   triamcinolone cream (KENALOG) 0.1 %       Follow up plan: Return if symptoms worsen or fail to improve.  Counseling provided for all of the vaccine components Orders Placed This Encounter  Procedures  . US Venous Img Lower Unilateral Right    Caryl Pina, MD [facility] 04/19/2019, 4:42 PM

## 2019-04-24 ENCOUNTER — Telehealth: Payer: Self-pay | Admitting: Family Medicine

## 2019-04-24 NOTE — Telephone Encounter (Signed)
Advise please.

## 2019-04-24 NOTE — Telephone Encounter (Signed)
Call In a prescription for compression stockings, size to fit, dx edema.  15 mm hg pressure

## 2019-04-24 NOTE — Telephone Encounter (Signed)
Faxed and patient aware. 

## 2019-05-03 ENCOUNTER — Other Ambulatory Visit (HOSPITAL_COMMUNITY): Payer: Self-pay | Admitting: Family Medicine

## 2019-05-03 DIAGNOSIS — Z1231 Encounter for screening mammogram for malignant neoplasm of breast: Secondary | ICD-10-CM

## 2019-06-04 ENCOUNTER — Encounter: Payer: Self-pay | Admitting: *Deleted

## 2019-06-04 ENCOUNTER — Other Ambulatory Visit: Payer: Self-pay

## 2019-06-04 ENCOUNTER — Ambulatory Visit (INDEPENDENT_AMBULATORY_CARE_PROVIDER_SITE_OTHER): Payer: Medicare Other | Admitting: *Deleted

## 2019-06-04 DIAGNOSIS — Z0000 Encounter for general adult medical examination without abnormal findings: Secondary | ICD-10-CM | POA: Diagnosis not present

## 2019-06-04 NOTE — Progress Notes (Signed)
MEDICARE ANNUAL WELLNESS VISIT  06/04/2019  Telephone Visit Disclaimer This Medicare AWV was conducted by telephone due to national recommendations for restrictions regarding the COVID-19 Pandemic (e.g. social distancing).  I verified, using two identifiers, that I am speaking with Ashley [name] or their authorized healthcare agent. I discussed the limitations, risks, security, and privacy concerns of performing an evaluation and management service by telephone and the potential availability of an in-person appointment in the future. The patient expressed understanding and agreed to proceed.   Subjective:  Ashley [name] is a 73 y.o. female patient of Janora Norlander, DO who had a Medicare Annual Wellness Visit today via telephone. Ashley [name] is Retired and lives with their spouse. she has 5 children. she reports that she is socially active and does interact with friends/family regularly. she is moderately physically active and enjoys line dancing, swimming, playing corn hole and shuffle board.  Patient Care Team: Janora Norlander, DO as PCP - General (Family Medicine) Gala Romney Cristopher Estimable, MD (Gastroenterology) System, Pcp Not In  Advanced Directives 06/04/2019 09/21/2016  Does Patient Have a Medical Advance Directive? Yes No  Type of Advance Directive Living will -  Does patient want to make changes to medical advance directive? No - Patient declined -  Would patient like information on creating a medical advance directive? - No - patient declined information    Hospital Utilization Over the Past 12 Months: # of hospitalizations or ER visits: 0 # of surgeries: 0  Review of Systems    Patient reports that her overall health is unchanged compared to last year.  Patient Reported Readings (BP, Pulse, CBG, Weight, etc) none  Review of Systems: No complaints  All other systems negative.  Pain Assessment Pain : No/denies pain     Current Medications & Allergies (verified)  Allergies as of 06/04/2019      Reactions   Demerol Nausea And Vomiting   Moviprep [peg-kcl-nacl-nasulf-na Asc-c] Nausea And Vomiting   Declomycin [demeclocycline] Rash      Medication List       Accurate as of June 04, 2019 10:26 AM. If you have any questions, ask your nurse or doctor.        STOP taking these medications   triamcinolone cream 0.1 % Commonly known as: KENALOG Stopped by: WYATT, AMY M, RN     TAKE these medications   aspirin EC 81 MG tablet Take 81 mg by mouth at bedtime.   atorvastatin 20 MG tablet Commonly known as: LIPITOR Take 1 tablet (20 mg total) by mouth at bedtime.   cholecalciferol 1000 units tablet Commonly known as: VITAMIN D Take 1,000 Units by mouth daily.   Fish Oil 1200 MG Caps Take 1 capsule by mouth every morning.   furosemide 20 MG tablet Commonly known as: Lasix Take 1 tablet (20 mg total) by mouth daily.   lisinopril 5 MG tablet Commonly known as: ZESTRIL Take 1 tablet (5 mg total) by mouth daily.   METAMUCIL PO Take one (1) teaspoon by mouth daily at bedtime.   OS-CAL CALCIUM + D3 PO Take 1 tablet by mouth daily. Calcium 1200 mg with Vitamin D   pyridoxine 200 MG tablet Commonly known as: B-6 Take 200 mg daily by mouth.   vitamin B-12 1000 MCG tablet Commonly known as: CYANOCOBALAMIN Take 1,000 mcg by mouth daily.       History (reviewed): Past Medical History:  Diagnosis Date  . Coronary artery disease 01/08/10   STATUS  POST STENTING OF THE LAD AND THE FIRST OBTUSE MARGINAL VESSEL  . Diverticulitis   . Headache(784.0)   . Heart disease   . Herpes zoster   . Hypercholesterolemia   . Hyperlipidemia   . Phlebitis Right   Leg  . PONV (postoperative nausea and vomiting)   . Stomach ulcer    Past Surgical History:  Procedure Laterality Date  . ABDOMINAL HYSTERECTOMY     with bladder tack  . BREAST BIOPSY Left    Normal  . Bunionectomy Right   . CARDIAC CATHETERIZATION  01/08/10  . CARDIAC  CATHETERIZATION  01/05/10   NORMAL LEFT VENTRICULAR SIZE AND NORMAL SYSTOLIC FUNCTION. EF IS 60%.   Marland Kitchen CATARACT EXTRACTION    . COLONOSCOPY  07/13/2011   RMR:Lax anal sphincter tone otherwise normal/left sided diverticula, polyp removed but not analyzed  . COLONOSCOPY N/A 09/21/2016   Procedure: COLONOSCOPY;  Surgeon: Daneil Dolin, MD;  Location: [facility];  Service: Endoscopy;  Laterality: N/A;  1:00 pm  . CORONARY STENT PLACEMENT    . VAGINAL HYSTERECTOMY    . VEIN LIGATION     Family History  Problem Relation Age of Onset  . Emphysema Father   . Colon cancer Father   . Lupus Child   . Heart disease Sister   . Heart disease Paternal Grandmother   . Hypertension Paternal Grandmother   . Heart disease Maternal Grandmother   . Hypertension Maternal Grandmother    Social History   Socioeconomic History  . Marital status: Married    Spouse name: Berneta Sages  . Number of children: 5  . Years of education: GED  . Highest education level: GED or equivalent  Occupational History  . Occupation: retired  Scientific laboratory technician  . Financial resource strain: Not hard at all  . Food insecurity    Worry: Never true    Inability: Never true  . Transportation needs    Medical: No    Non-medical: No  Tobacco Use  . Smoking status: Never Smoker  . Smokeless tobacco: Never Used  Substance and Sexual Activity  . Alcohol use: Yes    Comment: Socially-wine  . Drug use: No  . Sexual activity: Yes    Birth control/protection: Surgical  Lifestyle  . Physical activity    Days per week: 6 days    Minutes per session: 50 min  . Stress: Not at all  Relationships  . Social connections    Talks on phone: More than three times a week    Gets together: More than three times a week    Attends religious service: More than 4 times per year    Active member of club or organization: Yes    Attends meetings of clubs or organizations: More than 4 times per year    Relationship status: Married  Other Topics  Concern  . Not on file  Social History Narrative   ** Merged History Encounter **        Activities of Daily Living In your present state of health, do you have any difficulty performing the following activities: 06/04/2019  Hearing? N  Vision? N  Difficulty concentrating or making decisions? N  Walking or climbing stairs? N  Dressing or bathing? N  Doing errands, shopping? N  Preparing Food and eating ? N  Using the Toilet? N  In the past six months, have you accidently leaked urine? Y  Comment wears Depends-has seen Urology in the past  Do you have problems with  loss of bowel control? N  Managing your Medications? N  Managing your Finances? N  Housekeeping or managing your Housekeeping? N  Some recent data might be hidden    Patient Education/ Literacy How often do you need to have someone help you when you read instructions, pamphlets, or other written materials from your doctor or pharmacy?: 1 - Never What is the last grade level you completed in school?: GED  Exercise Current Exercise Habits: Home exercise routine, Type of exercise: walking;Other - see comments(swimming), Time (Minutes): 45, Frequency (Times/Week): 6, Weekly Exercise (Minutes/Week): 270, Intensity: Mild, Exercise limited by: None identified  Diet Patient reports consuming 3 meals a day and 2 snack(s) a day Patient reports that her primary diet is: Regular Patient reports that she does have regular access to food.   Depression Screen PHQ 2/9 Scores 06/04/2019 12/11/2018 06/15/2018 02/28/2018 12/29/2017 12/15/2017 11/30/2017  PHQ - 2 Score 0 0 0 0 0 0 0  PHQ- 9 Score - 0 - - - - -     Fall Risk Fall Risk  06/04/2019 12/11/2018 02/28/2018 12/29/2017 12/15/2017  Falls in the past year? 0 0 No No No     Objective:  Ashley [name] seemed alert and oriented and she participated appropriately during our telephone visit.  Blood Pressure Weight BMI  BP Readings from Last 3 Encounters:  04/19/19 (!) 149/55  12/18/18  128/62  12/11/18 (!) 137/58   Wt Readings from Last 3 Encounters:  04/19/19 139 lb 9.6 oz (63.3 kg)  12/18/18 145 lb 9.6 oz (66 kg)  12/11/18 142 lb (64.4 kg)   BMI Readings from Last 1 Encounters:  04/19/19 26.38 kg/m    *Unable to obtain current vital signs, weight, and BMI due to telephone visit type  Hearing/Vision  . Ashley [name] did not seem to have difficulty with hearing/understanding during the telephone conversation . Reports that she has not had a formal eye exam by an eye care professional within the past year . Reports that she has not had a formal hearing evaluation within the past year *Unable to fully assess hearing and vision during telephone visit type  Cognitive Function: 6CIT Screen 06/04/2019  What Year? 0 points  What month? 0 points  What time? 0 points  Count back from 20 0 points  Months in reverse 0 points  Repeat phrase 4 points  Total Score 4   (Normal:0-7, Significant for Dysfunction: >8)  Normal Cognitive Function Screening: Yes   Immunization & Health Maintenance Record Immunization History  Administered Date(s) Administered  . Influenza-Unspecified 08/16/2018  . Pneumococcal Polysaccharide-23 08/18/2016    Health Maintenance  Topic Date Due  . Samul Dada  10/31/1965  . PNA vac Low Risk Adult (2 of 2 - PCV13) 08/18/2017  . INFLUENZA VACCINE  06/15/2019  . MAMMOGRAM  05/31/2020  . COLONOSCOPY  09/21/2026  . DEXA SCAN  Completed  . Hepatitis C Screening  Completed       Assessment  This is a routine wellness examination for Ashley [name].  Health Maintenance: Due or Overdue Health Maintenance Due  Topic Date Due  . TETANUS/TDAP  10[birth year]31/1965  . PNA vac Low Risk Adult (2 of 2 - PCV13) 08/18/2017    Ashley [name] does not need a referral for Community Assistance: Care Management:   no Social Work:    no Prescription Assistance:  no Nutrition/Diabetes Education:  no   Plan:  Personalized Goals Goals Addressed  This Visit's Progress   . DIET - INCREASE WATER INTAKE       Try to drink 6-8 glasses of water daily.      Personalized Health Maintenance & Screening Recommendations  Pneumococcal vaccine  Td vaccine Advanced directives: has an advanced directive - a copy HAS NOT been provided.  Lung Cancer Screening Recommended: no (Low Dose CT Chest recommended if Age 84-80 years, 30 pack-year currently smoking OR have quit w/in past 15 years) Hepatitis C Screening recommended: no HIV Screening recommended: no  Advanced Directives: Written information was not prepared per patient's request.  Referrals & Orders No orders of the defined types were placed in this encounter.   Follow-up Plan . Follow-up with Janora Norlander, DO as planned . Consider TDAP and Prevnar 13 at your next visit with your PCP . Bring a copy of your Advanced Directives for our records   I have personally reviewed and noted the following in the patient's chart:   . Medical and social history . Use of alcohol, tobacco or illicit drugs  . Current medications and supplements . Functional ability and status . Nutritional status . Physical activity . Advanced directives . List of other physicians . Hospitalizations, surgeries, and ER visits in previous 12 months . Vitals . Screenings to include cognitive, depression, and falls . Referrals and appointments  In addition, I have reviewed and discussed with Ashley [name] certain preventive protocols, quality metrics, and best practice recommendations. A written personalized care plan for preventive services as well as general preventive health recommendations is available and can be mailed to the patient at her request.      Marylin Crosby, LPN  [id number]

## 2019-06-04 NOTE — Patient Instructions (Signed)
Preventive Care 75 Years and Older, Female Preventive care refers to lifestyle choices and visits with your health care provider that can promote health and wellness. This includes:  A yearly physical exam. This is also called an annual well check.  Regular dental and eye exams.  Immunizations.  Screening for certain conditions.  Healthy lifestyle choices, such as diet and exercise. What can I expect for my preventive care visit? Physical exam Your health care provider will check:  Height and weight. These may be used to calculate body mass index (BMI), which is a measurement that tells if you are at a healthy weight.  Heart rate and blood pressure.  Your skin for abnormal spots. Counseling Your health care provider may ask you questions about:  Alcohol, tobacco, and drug use.  Emotional well-being.  Home and relationship well-being.  Sexual activity.  Eating habits.  History of falls.  Memory and ability to understand (cognition).  Work and work Statistician.  Pregnancy and menstrual history. What immunizations do I need?  Influenza (flu) vaccine  This is recommended every year. Tetanus, diphtheria, and pertussis (Tdap) vaccine  You may need a Td booster every 10 years. Varicella (chickenpox) vaccine  You may need this vaccine if you have not already been vaccinated. Zoster (shingles) vaccine  You may need this after age 75. Pneumococcal conjugate (PCV13) vaccine  One dose is recommended after age 75. Pneumococcal polysaccharide (PPSV23) vaccine  One dose is recommended after age 75. Measles, mumps, and rubella (MMR) vaccine  You may need at least one dose of MMR if you were born in 1957 or later. You may also need a second dose. Meningococcal conjugate (MenACWY) vaccine  You may need this if you have certain conditions. Hepatitis A vaccine  You may need this if you have certain conditions or if you travel or work in places where you may be exposed  to hepatitis A. Hepatitis B vaccine  You may need this if you have certain conditions or if you travel or work in places where you may be exposed to hepatitis B. Haemophilus influenzae type b (Hib) vaccine  You may need this if you have certain conditions. You may receive vaccines as individual doses or as more than one vaccine together in one shot (combination vaccines). Talk with your health care provider about the risks and benefits of combination vaccines. What tests do I need? Blood tests  Lipid and cholesterol levels. These may be checked every 5 years, or more frequently depending on your overall health.  Hepatitis C test.  Hepatitis B test. Screening  Lung cancer screening. You may have this screening every year starting at age 75 if you have a 30-pack-year history of smoking and currently smoke or have quit within the past 15 years.  Colorectal cancer screening. All adults should have this screening starting at age 75 and continuing until age 75. Your health care provider may recommend screening at age 75 if you are at increased risk. You will have tests every 1-10 years, depending on your results and the type of screening test.  Diabetes screening. This is done by checking your blood sugar (glucose) after you have not eaten for a while (fasting). You may have this done every 1-3 years.  Mammogram. This may be done every 1-2 years. Talk with your health care provider about how often you should have regular mammograms.  BRCA-related cancer screening. This may be done if you have a family history of breast, ovarian, tubal, or peritoneal cancers.  Other tests  Sexually transmitted disease (STD) testing.  Bone density scan. This is done to screen for osteoporosis. You may have this done starting at age 75. Follow these instructions at home: Eating and drinking  Eat a diet that includes fresh fruits and vegetables, whole grains, lean protein, and low-fat dairy products. Limit  your intake of foods with high amounts of sugar, saturated fats, and salt.  Take vitamin and mineral supplements as recommended by your health care provider.  Do not drink alcohol if your health care provider tells you not to drink.  If you drink alcohol: ? Limit how much you have to 0-1 drink a day. ? Be aware of how much alcohol is in your drink. In the [place] one drink equals one 12 oz bottle of beer (355 mL), one 5 oz glass of wine (148 mL), or one 1 oz glass of hard liquor (44 mL). Lifestyle  Take daily care of your teeth and gums.  Stay active. Exercise for at least 30 minutes on 5 or more days each week.  Do not use any products that contain nicotine or tobacco, such as cigarettes, e-cigarettes, and chewing tobacco. If you need help quitting, ask your health care provider.  If you are sexually active, practice safe sex. Use a condom or other form of protection in order to prevent STIs (sexually transmitted infections).  Talk with your health care provider about taking a low-dose aspirin or statin. What's next?  Go to your health care provider once a year for a well check visit.  Ask your health care provider how often you should have your eyes and teeth checked.  Stay up to date on all vaccines. This information is not intended to replace advice given to you by your health care provider. Make sure you discuss any questions you have with your health care provider. Document Released: 11/27/2015 Document Revised: 10/25/2018 Document Reviewed: 10/25/2018 Elsevier Patient Education  2020 [employer].

## 2019-06-06 ENCOUNTER — Other Ambulatory Visit: Payer: Self-pay

## 2019-06-06 ENCOUNTER — Ambulatory Visit (HOSPITAL_COMMUNITY)
Admission: RE | Admit: 2019-06-06 | Discharge: 2019-06-06 | Disposition: A | Discharge status: Home / Self Care | Payer: Medicare Other | Source: Ambulatory Visit | Attending: Family Medicine | Admitting: Family Medicine

## 2019-06-06 DIAGNOSIS — Z1231 Encounter for screening mammogram for malignant neoplasm of breast: Secondary | ICD-10-CM | POA: Diagnosis present

## 2019-06-12 ENCOUNTER — Ambulatory Visit (HOSPITAL_COMMUNITY): Payer: Medicare Other

## 2019-07-25 ENCOUNTER — Other Ambulatory Visit: Payer: Self-pay | Admitting: *Deleted

## 2019-07-25 DIAGNOSIS — Z20822 Contact with and (suspected) exposure to covid-19: Secondary | ICD-10-CM

## 2019-07-27 LAB — NOVEL CORONAVIRUS, NAA: SARS-CoV-2, NAA: NOT DETECTED

## 2019-08-01 ENCOUNTER — Telehealth: Payer: Self-pay | Admitting: Family Medicine

## 2019-08-01 NOTE — Telephone Encounter (Signed)
Patient aware to stay away as much as possible. Wash hands. If starts to develop symptoms to be seen in ED or Urgent Care.

## 2019-08-14 ENCOUNTER — Other Ambulatory Visit: Payer: Self-pay

## 2019-08-14 DIAGNOSIS — Z20822 Contact with and (suspected) exposure to covid-19: Secondary | ICD-10-CM

## 2019-08-15 LAB — NOVEL CORONAVIRUS, NAA: SARS-CoV-2, NAA: NOT DETECTED

## 2019-12-04 ENCOUNTER — Other Ambulatory Visit: Payer: Self-pay | Admitting: Family Medicine

## 2019-12-04 DIAGNOSIS — I10 Essential (primary) hypertension: Secondary | ICD-10-CM

## 2019-12-04 DIAGNOSIS — E7800 Pure hypercholesterolemia, unspecified: Secondary | ICD-10-CM

## 2020-01-15 ENCOUNTER — Other Ambulatory Visit: Payer: Self-pay | Admitting: Family Medicine

## 2020-01-15 DIAGNOSIS — I10 Essential (primary) hypertension: Secondary | ICD-10-CM

## 2020-01-27 ENCOUNTER — Encounter: Payer: Self-pay | Admitting: Family Medicine

## 2020-01-27 ENCOUNTER — Ambulatory Visit: Payer: Medicare Other | Admitting: Family Medicine

## 2020-01-27 ENCOUNTER — Ambulatory Visit (INDEPENDENT_AMBULATORY_CARE_PROVIDER_SITE_OTHER): Payer: Medicare Other

## 2020-01-27 ENCOUNTER — Other Ambulatory Visit: Payer: Self-pay

## 2020-01-27 VITALS — BP 144/60 | HR 68 | Temp 98.2°F | Resp 20 | Ht 61.0 in | Wt 142.0 lb

## 2020-01-27 DIAGNOSIS — M25532 Pain in left wrist: Secondary | ICD-10-CM

## 2020-01-27 MED ORDER — PREDNISONE 20 MG PO TABS: ORAL_TABLET | ORAL | 0 refills | Status: DC

## 2020-01-27 NOTE — Progress Notes (Signed)
Subjective:  Patient ID: Ashley [name], female    DOB: 1946/01/12, 74 y.o.   MRN: [id number]  Patient Care Team: Janora Norlander, DO as PCP - General (Family Medicine) Gala Romney Cristopher Estimable, MD (Gastroenterology) System, Pcp Not In   Chief Complaint:  left wrist pain   HPI: Ashley [name] is a 74 y.o. female presenting on 01/27/2020 for left wrist pain   Wrist Pain  The pain is present in the left wrist. This is a new problem. The current episode started 1 to 4 weeks ago. There has been no history of extremity trauma. The problem occurs constantly. The problem has been waxing and waning. The quality of the pain is described as aching and burning. The pain is at a severity of 4/10. The pain is mild. Associated symptoms include joint swelling. Pertinent negatives include no fever, inability to bear weight, itching, joint locking, limited range of motion, numbness, stiffness or tingling. The symptoms are aggravated by activity. She has tried nothing for the symptoms. The treatment provided no relief.      Relevant past medical, surgical, family, and social history reviewed and updated as indicated.  Allergies and medications reviewed and updated. Date reviewed: Chart in Epic.   Past Medical History:  Diagnosis Date  . Coronary artery disease 01/08/10   STATUS POST STENTING OF THE LAD AND THE FIRST OBTUSE MARGINAL VESSEL  . Diverticulitis   . Headache(784.0)   . Heart disease   . Herpes zoster   . Hypercholesterolemia   . Hyperlipidemia   . Phlebitis Right   Leg  . PONV (postoperative nausea and vomiting)   . Stomach ulcer     Past Surgical History:  Procedure Laterality Date  . ABDOMINAL HYSTERECTOMY     with bladder tack  . BREAST BIOPSY Left    Normal  . Bunionectomy Right   . CARDIAC CATHETERIZATION  01/08/10  . CARDIAC CATHETERIZATION  01/05/10   NORMAL LEFT VENTRICULAR SIZE AND NORMAL SYSTOLIC FUNCTION. EF IS 60%.   Marland Kitchen CATARACT EXTRACTION    . COLONOSCOPY   07/13/2011   RMR:Lax anal sphincter tone otherwise normal/left sided diverticula, polyp removed but not analyzed  . COLONOSCOPY N/A 09/21/2016   Procedure: COLONOSCOPY;  Surgeon: Daneil Dolin, MD;  Location: [facility];  Service: Endoscopy;  Laterality: N/A;  1:00 pm  . CORONARY STENT PLACEMENT    . VAGINAL HYSTERECTOMY    . VEIN LIGATION      Social History   Socioeconomic History  . Marital status: Married    Spouse name: Berneta Sages  . Number of children: 5  . Years of education: GED  . Highest education level: GED or equivalent  Occupational History  . Occupation: retired  Tobacco Use  . Smoking status: Never Smoker  . Smokeless tobacco: Never Used  Substance and Sexual Activity  . Alcohol use: Yes    Comment: Socially-wine  . Drug use: No  . Sexual activity: Yes    Birth control/protection: Surgical  Other Topics Concern  . Not on file  Social History Narrative   ** Merged History Encounter **       Social Determinants of Health   Financial Resource Strain: Low Risk   . Difficulty of Paying Living Expenses: Not hard at all  Food Insecurity: No Food Insecurity  . Worried About Charity fundraiser in the Last Year: Never true  . Ran Out of Food in the Last Year: Never true  Transportation Needs:  No Transportation Needs  . Lack of Transportation (Medical): No  . Lack of Transportation (Non-Medical): No  Physical Activity: Sufficiently Active  . Days of Exercise per Week: 6 days  . Minutes of Exercise per Session: 50 min  Stress: No Stress Concern Present  . Feeling of Stress : Not at all  Social Connections: Not Isolated  . Frequency of Communication with Friends and Family: More than three times a week  . Frequency of Social Gatherings with Friends and Family: More than three times a week  . Attends Religious Services: More than 4 times per year  . Active Member of Clubs or Organizations: Yes  . Attends Archivist Meetings: More than 4 times per year    . Marital Status: Married  Human resources officer Violence: Not At Risk  . Fear of Current or Ex-Partner: No  . Emotionally Abused: No  . Physically Abused: No  . Sexually Abused: No    Outpatient Encounter Medications as of 01/27/2020  Medication Sig  . aspirin EC 81 MG tablet Take 81 mg by mouth at bedtime.  Marland Kitchen atorvastatin (LIPITOR) 20 MG tablet TAKE ONE TABLET AT BEDTIME  . Calcium Carb-Cholecalciferol (OS-CAL CALCIUM + D3 PO) Take 1 tablet by mouth daily. Calcium 1200 mg with Vitamin D  . cholecalciferol (VITAMIN D) 1000 units tablet Take 1,000 Units by mouth daily.  . furosemide (LASIX) 20 MG tablet Take 1 tablet (20 mg total) by mouth daily. (Needs to be seen before next refill)  . Garlic 10 MG CAPS Take by mouth. One a day - otc  . lisinopril (ZESTRIL) 5 MG tablet TAKE 1 TABLET DAILY  . Omega-3 Fatty Acids (FISH OIL) 1200 MG CAPS Take 1 capsule by mouth every morning.  . Psyllium (METAMUCIL PO) Take one (1) teaspoon by mouth daily at bedtime.  . pyridoxine (B-6) 200 MG tablet Take 200 mg daily by mouth.  . vitamin B-12 (CYANOCOBALAMIN) 1000 MCG tablet Take 1,000 mcg by mouth daily.  . predniSONE (DELTASONE) 20 MG tablet 2 po at sametime daily for 5 days   No facility-administered encounter medications on file as of 01/27/2020.    Allergies  Allergen Reactions  . Demerol Nausea And Vomiting  . Moviprep [Peg-Kcl-Nacl-Nasulf-Na Asc-C] Nausea And Vomiting  . Declomycin [Demeclocycline] Rash    Review of Systems  Constitutional: Negative for activity change, appetite change, chills, diaphoresis, fatigue, fever and unexpected weight change.  HENT: Negative.   Eyes: Negative.  Negative for photophobia and visual disturbance.  Respiratory: Negative for cough, chest tightness and shortness of breath.   Cardiovascular: Negative for chest pain, palpitations and leg swelling.  Gastrointestinal: Negative for blood in stool, constipation, diarrhea, nausea and vomiting.  Endocrine:  Negative.   Genitourinary: Negative for dysuria, frequency and urgency.  Musculoskeletal: Positive for arthralgias and joint swelling. Negative for back pain, gait problem, myalgias, neck pain, neck stiffness and stiffness.  Skin: Negative.  Negative for itching.  Allergic/Immunologic: Negative.   Neurological: Negative for dizziness, tingling, tremors, seizures, syncope, facial asymmetry, speech difficulty, weakness, light-headedness, numbness and headaches.  Hematological: Negative.   Psychiatric/Behavioral: Negative for confusion, hallucinations, sleep disturbance and suicidal ideas.  All other systems reviewed and are negative.       Objective:  BP (!) 144/60   Pulse 68   Temp 98.2 F (36.8 C)   Resp 20   Ht 5\' 1"  (1.549 m)   Wt 142 lb (64.4 kg)   SpO2 100%   BMI 26.83 kg/m  Wt Readings from Last 3 Encounters:  01/27/20 142 lb (64.4 kg)  04/19/19 139 lb 9.6 oz (63.3 kg)  12/18/18 145 lb 9.6 oz (66 kg)    Physical Exam Vitals and nursing note reviewed.  Constitutional:      General: She is not in acute distress.    Appearance: Normal appearance. She is well-developed and well-groomed. She is not ill-appearing, toxic-appearing or diaphoretic.  HENT:     Head: Normocephalic and atraumatic.     Jaw: There is normal jaw occlusion.     Right Ear: Hearing normal.     Left Ear: Hearing normal.     Nose: Nose normal.     Mouth/Throat:     Lips: Pink.     Mouth: Mucous membranes are moist.     Pharynx: Oropharynx is clear. Uvula midline.  Eyes:     General: Lids are normal.     Extraocular Movements: Extraocular movements intact.     Conjunctiva/sclera: Conjunctivae normal.     Pupils: Pupils are equal, round, and reactive to light.  Neck:     Thyroid: No thyroid mass, thyromegaly or thyroid tenderness.     Vascular: No carotid bruit or JVD.     Trachea: Trachea and phonation normal.  Cardiovascular:     Rate and Rhythm: Normal rate and regular rhythm.     Chest  Wall: PMI is not displaced.     Pulses: Normal pulses.     Heart sounds: Normal heart sounds. No murmur. No friction rub. No gallop.   Pulmonary:     Effort: Pulmonary effort is normal. No respiratory distress.     Breath sounds: Normal breath sounds. No wheezing.  Abdominal:     General: Bowel sounds are normal. There is no distension or abdominal bruit.     Palpations: Abdomen is soft. There is no hepatomegaly or splenomegaly.     Tenderness: There is no abdominal tenderness. There is no right CVA tenderness or left CVA tenderness.     Hernia: No hernia is present.  Musculoskeletal:        General: Normal range of motion.     Left elbow: Normal.     Left forearm: Normal.     Left wrist: Swelling and tenderness present. No deformity, effusion, lacerations, bony tenderness, snuff box tenderness or crepitus. Normal range of motion. Normal pulse.     Left hand: Normal.     Cervical back: Normal range of motion and neck supple.     Right lower leg: No edema.     Left lower leg: No edema.  Lymphadenopathy:     Cervical: No cervical adenopathy.  Skin:    General: Skin is warm and dry.     Capillary Refill: Capillary refill takes less than 2 seconds.     Coloration: Skin is not cyanotic, jaundiced or pale.     Findings: No rash.  Neurological:     General: No focal deficit present.     Mental Status: She is alert and oriented to person, place, and time.     Cranial Nerves: Cranial nerves are intact. No cranial nerve deficit.     Sensory: Sensation is intact. No sensory deficit.     Motor: Motor function is intact. No weakness.     Coordination: Coordination is intact. Coordination normal.     Gait: Gait is intact. Gait normal.     Deep Tendon Reflexes: Reflexes are normal and symmetric. Reflexes normal.  Psychiatric:  Attention and Perception: Attention and perception normal.        Mood and Affect: Mood and affect normal.        Speech: Speech normal.        Behavior:  Behavior normal. Behavior is cooperative.        Thought Content: Thought content normal.        Cognition and Memory: Cognition and memory normal.        Judgment: Judgment normal.     Results for orders placed or performed in visit on 08/14/19  Novel Coronavirus, NAA ([facility])   Specimen: Oropharyngeal(OP) collection in vial transport medium   OROPHARYNGEA  TESTING  Result Value Ref Range   SARS-CoV-2, NAA Not Detected Not Detected     X-Ray: left wrist: No acute findings. Preliminary x-ray reading by Monia Pouch, FNP-C, [facility].   Pertinent labs & imaging results that were available during my care of the patient were reviewed by me and considered in my medical decision making.  Assessment & Plan:  Terran was seen today for left wrist pain.  Diagnoses and all orders for this visit:  Left wrist pain Imaging unremarkable in office. Brace applied in office. Will burst with steroids. Symptomatic care discussed in detail. Report any new, worsening, or persistent symptoms.  -     DG Wrist Complete Left; Future -     predniSONE (DELTASONE) 20 MG tablet; 2 po at sametime daily for 5 days     Continue all other maintenance medications.  Follow up plan: Return if symptoms worsen or fail to improve.  Continue healthy lifestyle choices, including diet (rich in fruits, vegetables, and lean proteins, and low in salt and simple carbohydrates) and exercise (at least 30 minutes of moderate physical activity daily).  Educational handout given for wrist pain  The above assessment and management plan was discussed with the patient. The patient verbalized understanding of and has agreed to the management plan. Patient is aware to call the clinic if they develop any new symptoms or if symptoms persist or worsen. Patient is aware when to return to the clinic for a follow-up visit. Patient educated on when it is appropriate to go to the emergency department.   Monia Pouch, FNP-C [facility] [phone]

## 2020-01-27 NOTE — Patient Instructions (Signed)
Wrist Pain, Adult There are many things that can cause wrist pain. Some common causes include:  An injury to the wrist area.  Overuse of the joint.  A condition that causes too much pressure to be put on a nerve in the wrist (carpal tunnel syndrome).  Wear and tear of the joints that happens as a person gets older (osteoarthritis).  Other types of arthritis. Sometimes, the cause of wrist pain is not known. Often, the pain goes away when you follow your doctor's instructions for helping pain at home, such as resting or icing your wrist. If your wrist pain does not go away, it is important to tell your doctor. Follow these instructions at home:  Rest the wrist area for 48 hours or more, or as long as told by your doctor.  If a splint or elastic bandage has been put on your wrist, use it as told by your doctor. ? Take off the splint or bandage only as told by your doctor. ? Loosen the splint or bandage if your fingers tingle, lose feeling (get numb), or turn cold or blue.  If directed, apply ice to the injured area: ? If you have a removable splint or elastic bandage, remove it as told by your doctor. ? Put ice in a plastic bag. ? Place a towel between your skin and the bag or between your splint or bandage and the bag. ? Leave the ice on for 20 minutes, 2-3 times a day.   Keep your arm raised (elevated) above the level of your heart while you are sitting or lying down.  Take over-the-counter and prescription medicines only as told by your doctor.  Keep all follow-up visits as told by your doctor. This is important. Contact a doctor if:  You have a sudden sharp pain in the wrist, hand, or arm that is different or new.  The swelling or bruising on your wrist or hand gets worse.  Your skin becomes red, gets a rash, or has open sores.  Your pain does not get better or it gets worse. Get help right away if:  You lose feeling in your fingers or hand.  Your fingers turn white,  very red, or cold and blue.  You cannot move your fingers.  You have a fever or chills. This information is not intended to replace advice given to you by your health care provider. Make sure you discuss any questions you have with your health care provider. Document Revised: 10/13/2017 Document Reviewed: 05/19/2016 Elsevier Patient Education  2020 Elsevier Inc.    

## 2020-02-19 ENCOUNTER — Telehealth: Payer: Self-pay | Admitting: Cardiology

## 2020-02-19 NOTE — Telephone Encounter (Signed)
Fine with me  Ashley [name] Martinique MD, [facility]

## 2020-02-19 NOTE — Telephone Encounter (Signed)
New Message   Pt requesting to switch provider from Dr. Martinique to Dr. Percival Spanish due to transportation issue. Pt is closer to [facility] office  Please advise

## 2020-02-19 NOTE — Telephone Encounter (Signed)
OK 

## 2020-02-20 NOTE — Telephone Encounter (Signed)
Left message for patient per DPR. Okay for patient to change to Dr. Percival Spanish from Dr. Martinique and be seen in [place] office.

## 2020-02-28 NOTE — Telephone Encounter (Signed)
Follow up  Spoke with pt, she said her daughter advised her to stay with Dr. Martinique and that she will drive her to [facility]. She would like to cancel her request on switching providers. She has a schedule to see Dr. Martinique on 03/18/20  Thank you.

## 2020-03-02 NOTE — Telephone Encounter (Signed)
Noted  

## 2020-03-10 ENCOUNTER — Other Ambulatory Visit: Payer: Self-pay | Admitting: Family Medicine

## 2020-03-10 DIAGNOSIS — E7800 Pure hypercholesterolemia, unspecified: Secondary | ICD-10-CM

## 2020-03-16 NOTE — Progress Notes (Signed)
Ashley [name] Date of Birth: 07/14/1946   History of Present Illness: Ashley [name] is seen  for followup CAD.  She is status post stenting of the proximal LAD and first obtuse marginal vessels in February 2011 with bare-metal stents. She had a normal stress Echo in 3/15.   On follow up today she is doing well. She has no chest pain or SOB. She is active playing shuffleboard and dancing. She just finished competing in the senior games. No palpitations.   Current Outpatient Medications on File Prior to Visit  Medication Sig Dispense Refill  . aspirin EC 81 MG tablet Take 81 mg by mouth at bedtime.    Marland Kitchen atorvastatin (LIPITOR) 20 MG tablet Take 1 tablet (20 mg total) by mouth at bedtime. (Needs to be seen before next refill) 30 tablet 0  . Calcium Carb-Cholecalciferol (OS-CAL CALCIUM + D3 PO) Take 1 tablet by mouth daily. Calcium 1200 mg with Vitamin D    . cholecalciferol (VITAMIN D) 1000 units tablet Take 1,000 Units by mouth daily.    . furosemide (LASIX) 20 MG tablet Take 1 tablet (20 mg total) by mouth daily. (Needs to be seen before next refill) 30 tablet 0  . Garlic 10 MG CAPS Take by mouth. One a day - otc    . lisinopril (ZESTRIL) 5 MG tablet TAKE 1 TABLET DAILY 90 tablet 0  . Omega-3 Fatty Acids (FISH OIL) 1200 MG CAPS Take 1 capsule by mouth every morning.    . predniSONE (DELTASONE) 20 MG tablet 2 po at sametime daily for 5 days 10 tablet 0  . Psyllium (METAMUCIL PO) Take one (1) teaspoon by mouth daily at bedtime.    . pyridoxine (B-6) 200 MG tablet Take 200 mg daily by mouth.    . vitamin B-12 (CYANOCOBALAMIN) 1000 MCG tablet Take 1,000 mcg by mouth daily.     No current facility-administered medications on file prior to visit.    Allergies  Allergen Reactions  . Demerol Nausea And Vomiting  . Moviprep [Peg-Kcl-Nacl-Nasulf-Na Asc-C] Nausea And Vomiting  . Declomycin [Demeclocycline] Rash    Past Medical History:  Diagnosis Date  . Coronary artery disease 01/08/10   STATUS POST STENTING OF THE LAD AND THE FIRST OBTUSE MARGINAL VESSEL  . Diverticulitis   . Headache(784.0)   . Heart disease   . Herpes zoster   . Hypercholesterolemia   . Hyperlipidemia   . Phlebitis Right   Leg  . PONV (postoperative nausea and vomiting)   . Stomach ulcer     Past Surgical History:  Procedure Laterality Date  . ABDOMINAL HYSTERECTOMY     with bladder tack  . BREAST BIOPSY Left    Normal  . Bunionectomy Right   . CARDIAC CATHETERIZATION  01/08/10  . CARDIAC CATHETERIZATION  01/05/10   NORMAL LEFT VENTRICULAR SIZE AND NORMAL SYSTOLIC FUNCTION. EF IS 60%.   Marland Kitchen CATARACT EXTRACTION    . COLONOSCOPY  07/13/2011   RMR:Lax anal sphincter tone otherwise normal/left sided diverticula, polyp removed but not analyzed  . COLONOSCOPY N/A 09/21/2016   Procedure: COLONOSCOPY;  Surgeon: Daneil Dolin, MD;  Location: [facility];  Service: Endoscopy;  Laterality: N/A;  1:00 pm  . CORONARY STENT PLACEMENT    . VAGINAL HYSTERECTOMY    . VEIN LIGATION      Social History   Tobacco Use  Smoking Status Never Smoker  Smokeless Tobacco Never Used    Social History   Substance and Sexual Activity  Alcohol Use Yes   Comment: Socially-wine    Family History  Problem Relation Age of Onset  . Emphysema Father   . Colon cancer Father   . Lupus Child   . Heart disease Sister   . Heart disease Paternal Grandmother   . Hypertension Paternal Grandmother   . Heart disease Maternal Grandmother   . Hypertension Maternal Grandmother     Review of Systems: As noted in history of present illness.  All other systems were reviewed and are negative.  Physical Exam: BP (!) 142/74   Pulse 71   Temp 97.7 F (36.5 C)   Ht 5\' 1"  (1.549 m)   Wt 143 lb 9.6 oz (65.1 kg)   SpO2 96%   BMI 27.13 kg/m  GENERAL:  Well appearing WF in NAD HEENT:  PERRL, EOMI, sclera are clear. Oropharynx is clear. NECK:  No jugular venous distention, carotid upstroke brisk and symmetric, no bruits,  no thyromegaly or adenopathy LUNGS:  Clear to auscultation bilaterally CHEST:  Unremarkable HEART:  RRR,  PMI not displaced or sustained,S1 and S2 within normal limits, no S3, no S4: no clicks, no rubs, no murmurs ABD:  Soft, nontender. BS +, no masses or bruits. No hepatomegaly, no splenomegaly EXT:  2 + pulses throughout, 1+ RLE edema, no cyanosis no clubbing SKIN:  Warm and dry.  No rashes NEURO:  Alert and oriented x 3. Cranial nerves II through XII intact. PSYCH:  Cognitively intact    LABORATORY DATA:  Lab Results  Component Value Date   WBC 6.7 12/13/2018   HGB 12.7 12/13/2018   HCT 38.1 12/13/2018   PLT 203 12/13/2018   GLUCOSE 92 12/13/2018   CHOL 169 12/13/2018   TRIG 78 12/13/2018   HDL 63 12/13/2018   LDLCALC 90 12/13/2018   ALT 24 12/13/2018   AST 28 12/13/2018   NA 144 12/13/2018   K 4.7 12/13/2018   CL 105 12/13/2018   CREATININE 0.78 12/13/2018   BUN 10 12/13/2018   CO2 24 12/13/2018   INR 0.93 01/08/2010    ECG today shows NSR. Rate 71.  Normal. I have personally reviewed and interpreted this study.   Assessment / Plan: 1. Coronary disease status post 2 vessel stenting with bare-metal stents 2011. Normal stress Echo in March 2015,  She is still asymptomatic. Continue with aspirin therapy. Continue on statin with Lipitor.   2. Hypercholesterolemia. On  Lipitor. Will update labs today.  I will follow up in 1 year.

## 2020-03-18 ENCOUNTER — Encounter: Payer: Self-pay | Admitting: Cardiology

## 2020-03-18 ENCOUNTER — Other Ambulatory Visit: Payer: Self-pay

## 2020-03-18 ENCOUNTER — Ambulatory Visit: Payer: Medicare Other | Admitting: Cardiology

## 2020-03-18 VITALS — BP 142/74 | HR 71 | Temp 97.7°F | Ht 61.0 in | Wt 143.6 lb

## 2020-03-18 DIAGNOSIS — I2510 Atherosclerotic heart disease of native coronary artery without angina pectoris: Secondary | ICD-10-CM | POA: Diagnosis not present

## 2020-03-18 DIAGNOSIS — I10 Essential (primary) hypertension: Secondary | ICD-10-CM

## 2020-03-18 DIAGNOSIS — I2583 Coronary atherosclerosis due to lipid rich plaque: Secondary | ICD-10-CM

## 2020-03-18 DIAGNOSIS — E7800 Pure hypercholesterolemia, unspecified: Secondary | ICD-10-CM | POA: Diagnosis not present

## 2020-03-19 LAB — CBC WITH DIFFERENTIAL/PLATELET
Basophils Absolute: 0.1 10*3/uL (ref 0.0–0.2)
Basos: 1 %
EOS (ABSOLUTE): 0.3 10*3/uL (ref 0.0–0.4)
Eos: 4 %
Hematocrit: 38.2 % (ref 34.0–46.6)
Hemoglobin: 13.1 g/dL (ref 11.1–15.9)
Immature Grans (Abs): 0 10*3/uL (ref 0.0–0.1)
Immature Granulocytes: 0 %
Lymphocytes Absolute: 2.5 10*3/uL (ref 0.7–3.1)
Lymphs: 32 %
MCH: 30.5 pg (ref 26.6–33.0)
MCHC: 34.3 g/dL (ref 31.5–35.7)
MCV: 89 fL (ref 79–97)
Monocytes Absolute: 0.6 10*3/uL (ref 0.1–0.9)
Monocytes: 8 %
Neutrophils Absolute: 4.4 10*3/uL (ref 1.4–7.0)
Neutrophils: 55 %
Platelets: 223 10*3/uL (ref 150–450)
RBC: 4.3 x10E6/uL (ref 3.77–5.28)
RDW: 12.5 % (ref 11.7–15.4)
WBC: 7.9 10*3/uL (ref 3.4–10.8)

## 2020-03-19 LAB — LIPID PANEL
Chol/HDL Ratio: 2.6 ratio (ref 0.0–4.4)
Cholesterol, Total: 172 mg/dL (ref 100–199)
HDL: 65 mg/dL (ref >39)
LDL Chol Calc (NIH): 86 mg/dL (ref 0–99)
Triglycerides: 122 mg/dL (ref 0–149)
VLDL Cholesterol Cal: 21 mg/dL (ref 5–40)

## 2020-03-19 LAB — BASIC METABOLIC PANEL
BUN/Creatinine Ratio: 16 (ref 12–28)
BUN: 14 mg/dL (ref 8–27)
CO2: 28 mmol/L (ref 20–29)
Calcium: 9.1 mg/dL (ref 8.7–10.3)
Chloride: 102 mmol/L (ref 96–106)
Creatinine, Ser: 0.85 mg/dL (ref 0.57–1.00)
GFR calc Af Amer: 78 mL/min/{1.73_m2} (ref >59)
GFR calc non Af Amer: 67 mL/min/{1.73_m2} (ref >59)
Glucose: 135 mg/dL — ABNORMAL HIGH (ref 65–99)
Potassium: 4.1 mmol/L (ref 3.5–5.2)
Sodium: 142 mmol/L (ref 134–144)

## 2020-03-19 LAB — HEPATIC FUNCTION PANEL
ALT: 29 IU/L (ref 0–32)
AST: 31 IU/L (ref 0–40)
Albumin: 4.4 g/dL (ref 3.7–4.7)
Alkaline Phosphatase: 64 IU/L (ref 39–117)
Bilirubin Total: 0.3 mg/dL (ref 0.0–1.2)
Bilirubin, Direct: 0.11 mg/dL (ref 0.00–0.40)
Total Protein: 6.3 g/dL (ref 6.0–8.5)

## 2020-03-23 ENCOUNTER — Other Ambulatory Visit: Payer: Self-pay | Admitting: Family Medicine

## 2020-03-23 DIAGNOSIS — I10 Essential (primary) hypertension: Secondary | ICD-10-CM

## 2020-04-08 ENCOUNTER — Ambulatory Visit (INDEPENDENT_AMBULATORY_CARE_PROVIDER_SITE_OTHER): Payer: Medicare Other | Admitting: Family Medicine

## 2020-04-08 ENCOUNTER — Encounter: Payer: Self-pay | Admitting: Family Medicine

## 2020-04-08 DIAGNOSIS — N3090 Cystitis, unspecified without hematuria: Secondary | ICD-10-CM | POA: Diagnosis not present

## 2020-04-08 MED ORDER — SULFAMETHOXAZOLE-TRIMETHOPRIM 800-160 MG PO TABS: 1.0000 | ORAL_TABLET | Freq: Two times a day (BID) | ORAL | 0 refills | Status: DC

## 2020-04-08 NOTE — Progress Notes (Signed)
    Subjective:    Patient ID: Ashley [name], female    DOB: 1945/11/19, 74 y.o.   MRN: [id number]   HPI: Ashley [name] is a 74 y.o. female presenting for pressure and burning with urination and frequency for several days. Denies fever . No flank pain. No nausea, vomiting.    Depression screen [facility] 2/9 01/27/2020 06/04/2019 12/11/2018 06/15/2018 02/28/2018  Decreased Interest 0 0 0 0 0  Down, Depressed, Hopeless 0 0 0 0 0  PHQ - 2 Score 0 0 0 0 0  Altered sleeping - - 0 - -  Tired, decreased energy - - 0 - -  Change in appetite - - 0 - -  Feeling bad or failure about yourself  - - 0 - -  Trouble concentrating - - 0 - -  Moving slowly or fidgety/restless - - 0 - -  Suicidal thoughts - - 0 - -  PHQ-9 Score - - 0 - -  Difficult doing work/chores - - Not difficult at all - -     Relevant past medical, surgical, family and social history reviewed and updated as indicated.  Interim medical history since our last visit reviewed. Allergies and medications reviewed and updated.  ROS:  Review of Systems  Constitutional: Negative for chills, diaphoresis and fever.  HENT: Negative for congestion.   Eyes: Negative for visual disturbance.  Respiratory: Negative for cough and shortness of breath.   Cardiovascular: Negative for chest pain and palpitations.  Gastrointestinal: Negative for constipation, diarrhea and nausea.  Genitourinary: Positive for dysuria, frequency and urgency. Negative for decreased urine volume, flank pain, hematuria, menstrual problem and pelvic pain.  Musculoskeletal: Negative for arthralgias and joint swelling.  Skin: Negative for rash.  Neurological: Negative for dizziness and numbness.     Social History   Tobacco Use  Smoking Status Never Smoker  Smokeless Tobacco Never Used       Objective:     Wt Readings from Last 3 Encounters:  03/18/20 143 lb 9.6 oz (65.1 kg)  01/27/20 142 lb (64.4 kg)  04/19/19 139 lb 9.6 oz (63.3 kg)     Exam deferred. Pt.  [place] due to COVID 19. Phone visit performed.   Assessment & Plan:   1. Cystitis     Meds ordered this encounter  Medications  . sulfamethoxazole-trimethoprim (BACTRIM DS) 800-160 MG tablet    Sig: Take 1 tablet by mouth 2 (two) times daily.    Dispense:  14 tablet    Refill:  0          Virtual Visit via telephone Note  I discussed the limitations, risks, security and privacy concerns of performing an evaluation and management service by telephone and the availability of in person appointments. The patient was identified with two identifiers. Pt.expressed understanding and agreed to proceed. Pt. Is at home. Dr. Livia Snellen is in his office.  Follow Up Instructions:   I discussed the assessment and treatment plan with the patient. The patient was provided an opportunity to ask questions and all were answered. The patient agreed with the plan and demonstrated an understanding of the instructions.   The patient was advised to call back or seek an in-person evaluation if the symptoms worsen or if the condition fails to improve as anticipated.   Total minutes including chart review and phone contact time: 8   Follow up plan: Return if symptoms worsen or fail to improve.  Claretta Fraise, MD [place]

## 2020-04-14 ENCOUNTER — Other Ambulatory Visit: Payer: Self-pay | Admitting: Family Medicine

## 2020-04-14 ENCOUNTER — Telehealth: Payer: Self-pay | Admitting: Family Medicine

## 2020-04-14 MED ORDER — CIPROFLOXACIN HCL 250 MG PO TABS: 250.0000 mg | ORAL_TABLET | Freq: Two times a day (BID) | ORAL | 0 refills | Status: DC

## 2020-04-14 NOTE — Telephone Encounter (Signed)
If she took 5 days of it she doesn't need another antibiotic. If she took less, she should fill the scrip for cipro that I sent in.

## 2020-04-14 NOTE — Telephone Encounter (Signed)
Pt broke out in rash after starting Bactrim. Do you want to send something in different?

## 2020-04-14 NOTE — Telephone Encounter (Signed)
Patient aware and verbalized understanding. °

## 2020-04-16 ENCOUNTER — Telehealth: Payer: Self-pay | Admitting: Family Medicine

## 2020-04-16 MED ORDER — AMLODIPINE BESYLATE 2.5 MG PO TABS: 2.5000 mg | ORAL_TABLET | Freq: Every day | ORAL | 0 refills | Status: DC

## 2020-04-16 NOTE — Telephone Encounter (Signed)
She was on a very low-dose of lisinopril so I sent a very low-dose of amlodipine, she will need close follow-up in 1 to 2 weeks with PCP.  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 Caryl Pina, MD [facility] 04/16/2020, 3:11 PM

## 2020-04-16 NOTE — Telephone Encounter (Signed)
Patient aware waiting for Covering PCP to advise.

## 2020-04-16 NOTE — Telephone Encounter (Signed)
Covering pcp Please advise and if can wait until PCP returns - please route to her

## 2020-04-16 NOTE — Telephone Encounter (Signed)
Lisinopril was added to allergie list.  Patient aware of recommendations and verbalizes understanding.  Patient would like to wait for Dr. Darnell Level to advise before she picks up a new medication. Please review and advise Also the soonest patient can get in with you is x 3 weeks- is that okay?

## 2020-04-17 ENCOUNTER — Encounter: Payer: Self-pay | Admitting: Family Medicine

## 2020-04-17 NOTE — Telephone Encounter (Signed)
Have not seen this patient in over a year.  Please have her schedule a check up.  May send in Norvasc 2.5mg  daily.  Would like to see her in next 3-4 weeks for BP recheck

## 2020-04-17 NOTE — Telephone Encounter (Signed)
Patient has a follow up appointment scheduled and aware of new medicine.

## 2020-04-22 ENCOUNTER — Other Ambulatory Visit: Payer: Self-pay | Admitting: Family Medicine

## 2020-04-22 DIAGNOSIS — I10 Essential (primary) hypertension: Secondary | ICD-10-CM

## 2020-05-04 ENCOUNTER — Ambulatory Visit: Payer: Medicare Other | Admitting: Family Medicine

## 2020-05-04 ENCOUNTER — Encounter: Payer: Self-pay | Admitting: Family Medicine

## 2020-05-04 ENCOUNTER — Other Ambulatory Visit: Payer: Self-pay

## 2020-05-04 VITALS — BP 138/63 | HR 71 | Temp 98.3°F | Ht 61.0 in | Wt 142.0 lb

## 2020-05-04 DIAGNOSIS — Z23 Encounter for immunization: Secondary | ICD-10-CM

## 2020-05-04 DIAGNOSIS — I10 Essential (primary) hypertension: Secondary | ICD-10-CM

## 2020-05-04 DIAGNOSIS — R739 Hyperglycemia, unspecified: Secondary | ICD-10-CM | POA: Diagnosis not present

## 2020-05-04 DIAGNOSIS — E7800 Pure hypercholesterolemia, unspecified: Secondary | ICD-10-CM | POA: Diagnosis not present

## 2020-05-04 DIAGNOSIS — I2510 Atherosclerotic heart disease of native coronary artery without angina pectoris: Secondary | ICD-10-CM

## 2020-05-04 DIAGNOSIS — I2583 Coronary atherosclerosis due to lipid rich plaque: Secondary | ICD-10-CM

## 2020-05-04 LAB — BAYER DCA HB A1C WAIVED: HB A1C (BAYER DCA - WAIVED): 6 % (ref <7.0)

## 2020-05-04 NOTE — Patient Instructions (Signed)
BP is controlled without medication.  Goal is less than 140/90.  If it starts climbing, we will add the amlodipine.  Ok to hold Lasix and see how things go.  If BP goes up, you may need to continue taking.

## 2020-05-04 NOTE — Addendum Note (Signed)
Addended byCarrolyn Leigh on: 05/04/2020 04:19 PM   Modules accepted: Orders

## 2020-05-04 NOTE — Progress Notes (Signed)
Subjective: CC: f/u HTN, HLD PCP: Janora Norlander, DO DDU:KGURK A Ashley [name] is a 74 y.o. female presenting to clinic today for:  1. HTN, HLD, CAD Patient previously on lisinopril but this caused angioedema.  She was switched over to Norvasc 2.5 mg a few weeks ago and is here for interval follow-up.  She had labs done about a month ago.  Blood sugar was noted to be mildly elevated.  She notes that she never started the Norvasc 2.5 mg daily.  She has been off of the lisinopril since she was discharged from the hospital.  She still pretty adamant that the symptoms were likely from sulfa allergy as she had some type of unknown allergic reaction many years ago to an antibiotic that she thinks may have been sulfa.  Blood pressures have been normal at home.  She continues take Lasix for lower extremity edema but is unsure if this even helps.  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  She does urinate quite frequently and would like to come off of this medicine if possible.  She is compliant with atorvastatin.  No chest pain, shortness of breath.  No falls.   ROS: Per HPI  Allergies  Allergen Reactions   Demerol Nausea And Vomiting   Lisinopril Swelling    angioedema    Moviprep [Peg-Kcl-Nacl-Nasulf-Na Asc-C] Nausea And Vomiting   Declomycin [Demeclocycline] Rash   Past Medical History:  Diagnosis Date   Coronary artery disease 01/08/10   STATUS POST STENTING OF THE LAD AND THE FIRST OBTUSE MARGINAL VESSEL   Diverticulitis    Headache(784.0)    Heart disease    Herpes zoster    Hypercholesterolemia    Hyperlipidemia    Phlebitis Right   Leg   PONV (postoperative nausea and vomiting)    Stomach ulcer     Current Outpatient Medications:    amLODipine (NORVASC) 2.5 MG tablet, Take 1 tablet (2.5 mg total) by mouth daily., Disp: 30 tablet, Rfl: 0   aspirin EC 81 MG tablet, Take 81 mg by mouth at bedtime., Disp: , Rfl:     atorvastatin (LIPITOR) 20 MG tablet, Take 1 tablet (20 mg total) by mouth at bedtime. (Needs to be seen before next refill), Disp: 30 tablet, Rfl: 0   Calcium Carb-Cholecalciferol (OS-CAL CALCIUM + D3 PO), Take 1 tablet by mouth daily. Calcium 1200 mg with Vitamin D, Disp: , Rfl:    cholecalciferol (VITAMIN D) 1000 units tablet, Take 1,000 Units by mouth daily., Disp: , Rfl:    furosemide (LASIX) 20 MG tablet, TAKE 1 TABLET DAILY, Disp: 90 tablet, Rfl: 0   Garlic 10 MG CAPS, Take by mouth. One a day - otc, Disp: , Rfl:    Omega-3 Fatty Acids (FISH OIL) 1200 MG CAPS, Take 1 capsule by mouth every morning., Disp: , Rfl:    Psyllium (METAMUCIL PO), Take one (1) teaspoon by mouth daily at bedtime., Disp: , Rfl:    pyridoxine (B-6) 200 MG tablet, Take 200 mg daily by mouth., Disp: , Rfl:    vitamin B-12 (CYANOCOBALAMIN) 1000 MCG tablet, Take 1,000 mcg by mouth daily., Disp: , Rfl:  Social History   Socioeconomic History   Marital status: Married    Spouse name: Berneta Sages   Number of children: 5   Years of education: GED   Highest education level: GED or equivalent  Occupational History   Occupation: retired  Tobacco Use   Smoking status: Never Smoker  Smokeless tobacco: Never Used  Vaping Use   Vaping Use: Never used  Substance and Sexual Activity   Alcohol use: Yes    Comment: Socially-wine   Drug use: No   Sexual activity: Yes    Birth control/protection: Surgical  Other Topics Concern   Not on file  Social History Narrative   ** Merged History Encounter **       Social Determinants of Health   Financial Resource Strain: Low Risk    Difficulty of Paying Living Expenses: Not hard at all  Food Insecurity: No Food Insecurity   Worried About Charity fundraiser in the Last Year: Never true   Ran Out of Food in the Last Year: Never true  Transportation Needs: No Transportation Needs   Lack of Transportation (Medical): No   Lack of Transportation  (Non-Medical): No  Physical Activity: Sufficiently Active   Days of Exercise per Week: 6 days   Minutes of Exercise per Session: 50 min  Stress: No Stress Concern Present   Feeling of Stress : Not at all  Social Connections: Socially Integrated   Frequency of Communication with Friends and Family: More than three times a week   Frequency of Social Gatherings with Friends and Family: More than three times a week   Attends Religious Services: More than 4 times per year   Active Member of [employer] or Organizations: Yes   Attends Music therapist: More than 4 times per year   Marital Status: Married  Human resources officer Violence: Not At Risk   Fear of Current or Ex-Partner: No   Emotionally Abused: No   Physically Abused: No   Sexually Abused: No   Family History  Problem Relation Age of Onset   Emphysema Father    Colon cancer Father    Lupus Child    Heart disease Sister    Heart disease Paternal Grandmother    Hypertension Paternal Grandmother    Heart disease Maternal Grandmother    Hypertension Maternal Grandmother     Objective: Office vital signs reviewed. BP 138/63    Pulse 71    Temp 98.3 F (36.8 C) (Temporal)    Ht 5\' 1"  (1.549 m)    Wt 142 lb (64.4 kg)    SpO2 100%    BMI 26.83 kg/m   Physical Examination:  General: Awake, alert, well nourished, No acute distress HEENT: Normal, sclera white, MMM Cardio: regular rate and rhythm, S1S2 heard, no murmurs appreciated Pulm: clear to auscultation bilaterally, no wheezes, rhonchi or rales; normal work of breathing on room air Extremities: warm, well perfused, No edema on LLE,  She has 1+ pitting edema of RLE to mid shin (this is chronic). No cyanosis or clubbing; +2 pulses bilaterally  Assessment/ Plan: 74 y.o. female   1. Essential hypertension Controlled off of medication.  I reviewed her ED notes.  No need to start amlodipine.  She is to monitor blood pressures closely, particularly since  she would like to hold off on continuing Lasix.  We discussed goal blood pressure of less than 140/90.  2. Hypercholesterolemia Continue statin.  3. Coronary artery disease due to lipid rich plaque Continue statin and blood pressure control  4. Elevated serum glucose Check A1c given elevated serum glucose - Bayer DCA Hb A1c Waived   Orders Placed This Encounter  Procedures   Bayer DCA Hb A1c Waived   No orders of the defined types were placed in this encounter.    Janora Norlander,  DO [place] [phone]

## 2020-05-11 ENCOUNTER — Telehealth: Payer: Self-pay | Admitting: Family Medicine

## 2020-05-11 NOTE — Telephone Encounter (Signed)
Begin amlodipine and do not stop lasix,  keep feet elevated.  Call if no improvement.

## 2020-05-11 NOTE — Telephone Encounter (Signed)
Pt states that Dr. Darnell Level took her off her bp medication and she has not began her new bp medication(amlodipine). Ankles are swollen and bp is 148/75. Has been taking the Lasix but cannot get her shoe on due to the swelling. Pt states she is unsure if she should start the bp medication and if she should take the Lasix's today.

## 2020-05-12 ENCOUNTER — Other Ambulatory Visit (HOSPITAL_COMMUNITY): Payer: Self-pay | Admitting: Family Medicine

## 2020-05-12 DIAGNOSIS — Z1231 Encounter for screening mammogram for malignant neoplasm of breast: Secondary | ICD-10-CM

## 2020-06-10 ENCOUNTER — Other Ambulatory Visit: Payer: Self-pay | Admitting: Family Medicine

## 2020-06-10 DIAGNOSIS — E7800 Pure hypercholesterolemia, unspecified: Secondary | ICD-10-CM

## 2020-06-11 ENCOUNTER — Ambulatory Visit (HOSPITAL_COMMUNITY): Payer: Medicare Other

## 2020-06-11 ENCOUNTER — Other Ambulatory Visit: Payer: Self-pay | Admitting: *Deleted

## 2020-06-11 MED ORDER — AMLODIPINE BESYLATE 2.5 MG PO TABS: 2.5000 mg | ORAL_TABLET | Freq: Every day | ORAL | 3 refills | Status: DC

## 2020-06-12 ENCOUNTER — Telehealth (INDEPENDENT_AMBULATORY_CARE_PROVIDER_SITE_OTHER): Payer: Medicare Other | Admitting: Family Medicine

## 2020-06-12 DIAGNOSIS — J069 Acute upper respiratory infection, unspecified: Secondary | ICD-10-CM | POA: Diagnosis not present

## 2020-06-12 MED ORDER — BENZONATATE 200 MG PO CAPS: 200.0000 mg | ORAL_CAPSULE | Freq: Two times a day (BID) | ORAL | 0 refills | Status: DC | PRN

## 2020-06-12 NOTE — Telephone Encounter (Signed)
Telephone visit  Subjective: CC: URI cough PCP: Janora Norlander, DO Ashley [name] is a 74 y.o. female calls for telephone consult today. Patient provides verbal consent for consult held via phone.  Due to COVID-19 pandemic this visit was conducted virtually. This visit type was conducted due to national recommendations for restrictions regarding the COVID-19 Pandemic (e.g. social distancing, sheltering in place) in an effort to limit this patient's exposure and mitigate transmission in our community. All issues noted in this document were discussed and addressed.  A physical exam was not performed with this format.   Location of patient: home Location of provider: [facility] Others present for call: spouse  1. URI cough Patient reports onset 3 days ago.  She developed laryngitis around that time as well.  The laryngitis does seem to be getting better.  The cough is productive with yellow sputum.  No hemoptysis.  No fevers.  No shortness of breath.  No myalgia.  She has been using Robitussin but this is not helping much.  Her husband is sick with similar but he is improving.  He has not been vaccinated against Covid   ROS: Per HPI  Allergies  Allergen Reactions  . Sulfa Antibiotics Swelling    Angoedema  . Demerol Nausea And Vomiting  . Lisinopril Swelling    angioedema . Uncertain if due to Sulfa or from ACE-I but ACE-I was discontinued to be safe.  Ashley Kitchen Moviprep [Peg-Kcl-Nacl-Nasulf-Na Asc-C] Nausea And Vomiting  . Declomycin [Demeclocycline] Rash   Past Medical History:  Diagnosis Date  . Coronary artery disease 01/08/10   STATUS POST STENTING OF THE LAD AND THE FIRST OBTUSE MARGINAL VESSEL  . Diverticulitis   . Headache(784.0)   . Heart disease   . Herpes zoster   . Hypercholesterolemia   . Hyperlipidemia   . Phlebitis Right   Leg  . PONV (postoperative nausea and vomiting)   . Stomach ulcer     Current Outpatient Medications:  .  amLODipine (NORVASC) 2.5 MG tablet,  Take 1 tablet (2.5 mg total) by mouth daily., Disp: 90 tablet, Rfl: 3 .  aspirin EC 81 MG tablet, Take 81 mg by mouth at bedtime., Disp: , Rfl:  .  atorvastatin (LIPITOR) 20 MG tablet, TAKE ONE TABLET AT BEDTIME, Disp: 90 tablet, Rfl: 1 .  benzonatate (TESSALON) 200 MG capsule, Take 1 capsule (200 mg total) by mouth 2 (two) times daily as needed for cough., Disp: 20 capsule, Rfl: 0 .  Calcium Carb-Cholecalciferol (OS-CAL CALCIUM + D3 PO), Take 1 tablet by mouth daily. Calcium 1200 mg with Vitamin D, Disp: , Rfl:  .  cholecalciferol (VITAMIN D) 1000 units tablet, Take 1,000 Units by mouth daily., Disp: , Rfl:  .  furosemide (LASIX) 20 MG tablet, TAKE 1 TABLET DAILY, Disp: 90 tablet, Rfl: 0 .  Garlic 10 MG CAPS, Take by mouth. One a day - otc, Disp: , Rfl:  .  Omega-3 Fatty Acids (FISH OIL) 1200 MG CAPS, Take 1 capsule by mouth every morning., Disp: , Rfl:  .  Psyllium (METAMUCIL PO), Take one (1) teaspoon by mouth daily at bedtime., Disp: , Rfl:  .  pyridoxine (B-6) 200 MG tablet, Take 200 mg daily by mouth., Disp: , Rfl:  .  vitamin B-12 (CYANOCOBALAMIN) 1000 MCG tablet, Take 1,000 mcg by mouth daily., Disp: , Rfl:    Pulm: hoarse sounding, coughing intermittently  Assessment/ Plan: 74 y.o. female   Viral URI with cough  We discussed the possibility of COVID-19  infection.  Isolation recommended.  We discussed red flag signs and symptoms.  Okay to continue nasal saline spray.  Tessalon 200 mg twice daily Perles added for cough.  She will follow-up as needed on this issue  Start time: 10:49am End time: 10:52am  Total time spent on patient care (including telephone call/ virtual visit): 8 minutes  [place], [place] [phone]

## 2020-06-15 ENCOUNTER — Telehealth: Payer: Self-pay | Admitting: Family Medicine

## 2020-06-15 NOTE — Telephone Encounter (Signed)
Patient was given Tessalon 200mg  on 06/12/20.  Patient is not able to take the pills due to it making her choke.  Please advise

## 2020-06-15 NOTE — Telephone Encounter (Signed)
Patient aware and verbalizes understanding. 

## 2020-06-15 NOTE — Telephone Encounter (Signed)
Ok to continue OTC cough syrup if cannot tolerate pills.  Honey, tea, humidification.

## 2020-06-15 NOTE — Telephone Encounter (Signed)
Pt called stating that she had a televisit with Dr Lajuana Ripple this past Friday regarding Laryngitis and says the Rx Dr Lajuana Ripple sent in for her is not working. Pt is not able to take the pills because it causes her to choke and gag.

## 2020-06-25 ENCOUNTER — Ambulatory Visit (HOSPITAL_COMMUNITY)
Admission: RE | Admit: 2020-06-25 | Discharge: 2020-06-25 | Disposition: A | Discharge status: Home / Self Care | Payer: Medicare Other | Source: Ambulatory Visit | Attending: Family Medicine | Admitting: Family Medicine

## 2020-06-25 ENCOUNTER — Other Ambulatory Visit: Payer: Self-pay

## 2020-06-25 DIAGNOSIS — Z1231 Encounter for screening mammogram for malignant neoplasm of breast: Secondary | ICD-10-CM | POA: Insufficient documentation

## 2020-08-03 ENCOUNTER — Other Ambulatory Visit: Payer: Self-pay | Admitting: Family Medicine

## 2020-08-03 DIAGNOSIS — I10 Essential (primary) hypertension: Secondary | ICD-10-CM

## 2020-08-31 ENCOUNTER — Other Ambulatory Visit: Payer: Self-pay

## 2020-08-31 ENCOUNTER — Encounter: Payer: Self-pay | Admitting: Nurse Practitioner

## 2020-08-31 ENCOUNTER — Ambulatory Visit: Payer: Medicare Other | Admitting: Nurse Practitioner

## 2020-08-31 VITALS — BP 136/66 | HR 70 | Temp 97.7°F | Ht 61.0 in | Wt 142.2 lb

## 2020-08-31 DIAGNOSIS — M25532 Pain in left wrist: Secondary | ICD-10-CM | POA: Diagnosis not present

## 2020-08-31 MED ORDER — DICLOFENAC SODIUM 1 % EX GEL: 2.0000 g | Freq: Four times a day (QID) | CUTANEOUS | 1 refills | Status: DC

## 2020-08-31 NOTE — Progress Notes (Signed)
Acute Office Visit  Subjective:    Patient ID: Ashley [name], female    DOB: [birth year], 74 y.o.   MRN: [id number]  Chief Complaint  Patient presents with   Wrist Pain    left wrist pain on going has already been seen for this and has wrist brace     Wrist Pain  The pain is present in the left wrist. This is a recurrent problem. The current episode started more than 1 year ago. There has been no history of extremity trauma. The problem occurs constantly. The problem has been unchanged. The quality of the pain is described as aching. The pain is at a severity of 3/10. Associated symptoms include joint swelling and a limited range of motion. Pertinent negatives include no fever, inability to bear weight, joint locking, numbness, stiffness or tingling. The symptoms are aggravated by activity. She has tried nothing Engineer, agricultural) for the symptoms. The treatment provided no relief.     Past Medical History:  Diagnosis Date   Coronary artery disease 01/08/10   STATUS POST STENTING OF THE LAD AND THE FIRST OBTUSE MARGINAL VESSEL   Diverticulitis    Headache(784.0)    Heart disease    Herpes zoster    Hypercholesterolemia    Hyperlipidemia    Phlebitis Right   Leg   PONV (postoperative nausea and vomiting)    Stomach ulcer     Past Surgical History:  Procedure Laterality Date   ABDOMINAL HYSTERECTOMY     with bladder tack   BREAST BIOPSY Left    Normal   Bunionectomy Right    CARDIAC CATHETERIZATION  01/08/10   CARDIAC CATHETERIZATION  01/05/10   NORMAL LEFT VENTRICULAR SIZE AND NORMAL SYSTOLIC FUNCTION. EF IS 60%.    CATARACT EXTRACTION     COLONOSCOPY  07/13/2011   RMR:Lax anal sphincter tone otherwise normal/left sided diverticula, polyp removed but not analyzed   COLONOSCOPY N/A 09/21/2016   Procedure: COLONOSCOPY;  Surgeon: Daneil Dolin, MD;  Location: [facility];  Service: Endoscopy;  Laterality: N/A;  1:00 pm   CORONARY STENT PLACEMENT     VAGINAL  HYSTERECTOMY     VEIN LIGATION      Family History  Problem Relation Age of Onset   Emphysema Father    Colon cancer Father    Lupus Child    Heart disease Sister    Heart disease Paternal Grandmother    Hypertension Paternal Grandmother    Heart disease Maternal Grandmother    Hypertension Maternal Grandmother     Social History   Socioeconomic History   Marital status: Married    Spouse name: Berneta Sages   Number of children: 5   Years of education: GED   Highest education level: GED or equivalent  Occupational History   Occupation: retired  Tobacco Use   Smoking status: Never Smoker   Smokeless tobacco: Never Used  Scientific laboratory technician Use: Never used  Substance and Sexual Activity   Alcohol use: Yes    Comment: Socially-wine   Drug use: No   Sexual activity: Yes    Birth control/protection: Surgical  Other Topics Concern   Not on file  Social History Narrative   ** Merged History Encounter **       Social Determinants of Health   Financial Resource Strain:    Difficulty of Paying Living Expenses: Not on file  Food Insecurity:    Worried About Running Out of Food in the Last Year: Not on  file   [place] in the Last Year: Not on file  Transportation Needs:    Lack of Transportation (Medical): Not on file   Lack of Transportation (Non-Medical): Not on file  Physical Activity:    Days of Exercise per Week: Not on file   Minutes of Exercise per Session: Not on file  Stress:    Feeling of Stress : Not on file  Social Connections:    Frequency of Communication with Friends and Family: Not on file   Frequency of Social Gatherings with Friends and Family: Not on file   Attends Religious Services: Not on file   Active Member of Clubs or Organizations: Not on file   Attends Archivist Meetings: Not on file   Marital Status: Not on file  Intimate Partner Violence:    Fear of Current or Ex-Partner: Not on file    Emotionally Abused: Not on file   Physically Abused: Not on file   Sexually Abused: Not on file    Outpatient Medications Prior to Visit  Medication Sig Dispense Refill   amLODipine (NORVASC) 2.5 MG tablet Take 1 tablet (2.5 mg total) by mouth daily. 90 tablet 3   aspirin EC 81 MG tablet Take 81 mg by mouth at bedtime.     atorvastatin (LIPITOR) 20 MG tablet TAKE ONE TABLET AT BEDTIME 90 tablet 1   Calcium Carb-Cholecalciferol (OS-CAL CALCIUM + D3 PO) Take 1 tablet by mouth daily. Calcium 1200 mg with Vitamin D     cholecalciferol (VITAMIN D) 1000 units tablet Take 1,000 Units by mouth daily.     furosemide (LASIX) 20 MG tablet TAKE 1 TABLET DAILY 90 tablet 0   Garlic 10 MG CAPS Take by mouth. One a day - otc     Omega-3 Fatty Acids (FISH OIL) 1200 MG CAPS Take 1 capsule by mouth every morning.     Psyllium (METAMUCIL PO) Take one (1) teaspoon by mouth daily at bedtime.     pyridoxine (B-6) 200 MG tablet Take 200 mg daily by mouth.     vitamin B-12 (CYANOCOBALAMIN) 1000 MCG tablet Take 1,000 mcg by mouth daily.     benzonatate (TESSALON) 200 MG capsule Take 1 capsule (200 mg total) by mouth 2 (two) times daily as needed for cough. 20 capsule 0   No facility-administered medications prior to visit.    Allergies  Allergen Reactions   Sulfa Antibiotics Swelling    Angoedema   Demerol Nausea And Vomiting   Lisinopril Swelling    angioedema . Uncertain if due to Sulfa or from ACE-I but ACE-I was discontinued to be safe.   Moviprep [Peg-Kcl-Nacl-Nasulf-Na Asc-C] Nausea And Vomiting   Declomycin [Demeclocycline] Rash    Review of Systems  Constitutional: Negative for fever.  Musculoskeletal: Positive for joint swelling. Negative for stiffness.  Neurological: Negative for tingling and numbness.  All other systems reviewed and are negative.      Objective:    Physical Exam Vitals reviewed.  Constitutional:      Appearance: Normal appearance.  Eyes:      Conjunctiva/sclera: Conjunctivae normal.  Cardiovascular:     Rate and Rhythm: Normal rate and regular rhythm.     Pulses: Normal pulses.     Heart sounds: Normal heart sounds.  Pulmonary:     Effort: Pulmonary effort is normal.     Breath sounds: Normal breath sounds.  Abdominal:     General: Bowel sounds are normal.  Musculoskeletal:  General: Swelling and tenderness present.  Skin:    General: Skin is warm.  Neurological:     Mental Status: She is alert and oriented to person, place, and time.  Psychiatric:        Mood and Affect: Mood normal.     BP 136/66    Pulse 70    Temp 97.7 F (36.5 C) (Temporal)    Ht 5\' 1"  (1.549 m)    Wt 142 lb 3.2 oz (64.5 kg)    SpO2 100%    BMI 26.87 kg/m  Wt Readings from Last 3 Encounters:  08/31/20 142 lb 3.2 oz (64.5 kg)  05/04/20 142 lb (64.4 kg)  03/18/20 143 lb 9.6 oz (65.1 kg)    Lab Results  Component Value Date   WBC 7.9 03/18/2020   HGB 13.1 03/18/2020   HCT 38.2 03/18/2020   MCV 89 03/18/2020   PLT 223 03/18/2020   Lab Results  Component Value Date   NA 142 03/18/2020   K 4.1 03/18/2020   CO2 28 03/18/2020   GLUCOSE 135 (H) 03/18/2020   BUN 14 03/18/2020   CREATININE 0.85 03/18/2020   BILITOT 0.3 03/18/2020   ALKPHOS 64 03/18/2020   AST 31 03/18/2020   ALT 29 03/18/2020   PROT 6.3 03/18/2020   ALBUMIN 4.4 03/18/2020   CALCIUM 9.1 03/18/2020   Lab Results  Component Value Date   CHOL 172 03/18/2020   Lab Results  Component Value Date   HDL 65 03/18/2020   Lab Results  Component Value Date   LDLCALC 86 03/18/2020   Lab Results  Component Value Date   TRIG 122 03/18/2020   Lab Results  Component Value Date   CHOLHDL 2.6 03/18/2020   Lab Results  Component Value Date   HGBA1C 6.0 05/04/2020       Assessment & Plan:   Problem List Items Addressed This Visit      Other   Left wrist pain - Primary    Patient is a 74 year old female who presents to clinic for left wrist pain.  This is not  new for patient and symptoms have been present in the last few months.  Patient has worn a brace with no therapeutic effect.  Patient reports pain is mild to moderate.  Patient reports left wrist pain is affecting her quality of life. patient reports been unable to take any oral medication due to allergies.  Started patient on Voltaren gel cream, referral to orthopedic.  Continue brace and apply ice and rest joint. Follow-up with worsening or unresolved symptoms.      Relevant Orders   Ambulatory referral to Orthopedic Surgery       Meds ordered this encounter  Medications   diclofenac Sodium (VOLTAREN) 1 % GEL    Sig: Apply 2 g topically 4 (four) times daily.    Dispense:  50 g    Refill:  1    Order Specific Question:   Supervising Provider    Answer:   Caryl Pina A [[id number]]     Ivy Lynn, NP

## 2020-08-31 NOTE — Patient Instructions (Signed)
Wrist Pain, Adult There are many things that can cause wrist pain. Some common causes include:  An injury to the wrist area.  Overuse of the joint.  A condition that causes too much pressure to be put on a nerve in the wrist (carpal tunnel syndrome).  Wear and tear of the joints that happens as a person gets older (osteoarthritis).  Other types of arthritis. Sometimes, the cause of wrist pain is not known. Often, the pain goes away when you follow your doctor's instructions for helping pain at home, such as resting or icing your wrist. If your wrist pain does not go away, it is important to tell your doctor. Follow these instructions at home:  Rest the wrist area for 48 hours or more, or as long as told by your doctor.  If a splint or elastic bandage has been put on your wrist, use it as told by your doctor. ? Take off the splint or bandage only as told by your doctor. ? Loosen the splint or bandage if your fingers tingle, lose feeling (get numb), or turn cold or blue.  If directed, apply ice to the injured area: ? If you have a removable splint or elastic bandage, remove it as told by your doctor. ? Put ice in a plastic bag. ? Place a towel between your skin and the bag or between your splint or bandage and the bag. ? Leave the ice on for 20 minutes, 2-3 times a day.   Keep your arm raised (elevated) above the level of your heart while you are sitting or lying down.  Take over-the-counter and prescription medicines only as told by your doctor.  Keep all follow-up visits as told by your doctor. This is important. Contact a doctor if:  You have a sudden sharp pain in the wrist, hand, or arm that is different or new.  The swelling or bruising on your wrist or hand gets worse.  Your skin becomes red, gets a rash, or has open sores.  Your pain does not get better or it gets worse. Get help right away if:  You lose feeling in your fingers or hand.  Your fingers turn white,  very red, or cold and blue.  You cannot move your fingers.  You have a fever or chills. This information is not intended to replace advice given to you by your health care provider. Make sure you discuss any questions you have with your health care provider. Document Revised: 10/13/2017 Document Reviewed: 05/19/2016 Elsevier Patient Education  [place].

## 2020-08-31 NOTE — Assessment & Plan Note (Signed)
Patient is a 74 year old female who presents to clinic for left wrist pain.  This is not new for patient and symptoms have been present in the last few months.  Patient has worn a brace with no therapeutic effect.  Patient reports pain is mild to moderate.  Patient reports left wrist pain is affecting her quality of life. patient reports been unable to take any oral medication due to allergies.  Started patient on Voltaren gel cream, referral to orthopedic.  Continue brace and apply ice and rest joint. Follow-up with worsening or unresolved symptoms.

## 2020-09-29 ENCOUNTER — Encounter: Payer: Self-pay | Admitting: Nurse Practitioner

## 2020-09-29 ENCOUNTER — Ambulatory Visit (INDEPENDENT_AMBULATORY_CARE_PROVIDER_SITE_OTHER): Payer: Medicare Other | Admitting: Nurse Practitioner

## 2020-09-29 VITALS — Temp 98.2°F

## 2020-09-29 DIAGNOSIS — R059 Cough, unspecified: Secondary | ICD-10-CM | POA: Diagnosis not present

## 2020-09-29 NOTE — Progress Notes (Signed)
   Virtual Visit via telephone Note Due to COVID-19 pandemic this visit was conducted virtually. This visit type was conducted due to national recommendations for restrictions regarding the COVID-19 Pandemic (e.g. social distancing, sheltering in place) in an effort to limit this patient's exposure and mitigate transmission in our community. All issues noted in this document were discussed and addressed.  A physical exam was not performed with this format.  I connected with Ashley [name] on 09/29/20 at 10:33 am  by telephone and verified that I am speaking with the correct person using two identifiers. Ashley [name] is currently located at home and spouse is currently with patient during visit. The provider, Ivy Lynn, NP is located in their office at time of visit.  I discussed the limitations, risks, security and privacy concerns of performing an evaluation and management service by telephone and the availability of in person appointments. I also discussed with the patient that there may be a patient responsible charge related to this service. The patient expressed understanding and agreed to proceed.   History and Present Illness:  Cough This is a new problem. Episode onset: In the last 5 days. The problem has been unchanged. The problem occurs constantly. The cough is non-productive. Associated symptoms include headaches and a sore throat. Pertinent negatives include no chills, ear pain or fever. Nothing aggravates the symptoms. She has tried nothing for the symptoms.      Review of Systems  Constitutional: Negative for chills, fever and malaise/fatigue.  HENT: Positive for sore throat. Negative for congestion, ear pain and sinus pain.   Respiratory: Positive for cough.   Gastrointestinal: Negative for nausea and vomiting.  Neurological: Positive for headaches.  All other systems reviewed and are negative.    Observations/Objective:   Televisit  Assessment and  Plan: COVID-19 swab, Zyrtec, saline nasal spray, Tylenol for headache as needed, increase hydration, cough suppressant.  Follow Up Instructions: With unresolved or worsening symptoms   I discussed the assessment and treatment plan with the patient. The patient was provided an opportunity to ask questions and all were answered. The patient agreed with the plan and demonstrated an understanding of the instructions.   The patient was advised to call back or seek an in-person evaluation if the symptoms worsen or if the condition fails to improve as anticipated.  The above assessment and management plan was discussed with the patient. The patient verbalized understanding of and has agreed to the management plan. Patient is aware to call the clinic if symptoms persist or worsen. Patient is aware when to return to the clinic for a follow-up visit. Patient educated on when it is appropriate to go to the emergency department.   Time call ended:  10:45 am  I provided 11 minutes of non-face-to-face time during this encounter.    Ivy Lynn, NP

## 2020-09-29 NOTE — Assessment & Plan Note (Signed)
Cough not well controlled.  Symptoms started in the last 5 days.  Patient is reporting headache and mild sore throat.  No fevers, chills nausea or vomiting.  Advised patient to increase hydration.  Zyrtec, saline nasal spray, Tylenol for headache as needed, cough suppressant.  Rest.  COVID-19 test ordered.   Follow-up with worsening or unresolved symptoms.

## 2020-09-30 LAB — NOVEL CORONAVIRUS, NAA: SARS-CoV-2, NAA: NOT DETECTED

## 2020-09-30 LAB — SARS-COV-2, NAA 2 DAY TAT

## 2020-11-03 ENCOUNTER — Encounter: Payer: Self-pay | Admitting: Family Medicine

## 2020-11-03 ENCOUNTER — Other Ambulatory Visit: Payer: Self-pay | Admitting: Family Medicine

## 2020-11-03 ENCOUNTER — Ambulatory Visit: Payer: Medicare Other | Admitting: Family Medicine

## 2020-11-03 ENCOUNTER — Other Ambulatory Visit: Payer: Self-pay

## 2020-11-03 VITALS — BP 116/53 | HR 72 | Temp 97.9°F | Ht 61.0 in | Wt 144.4 lb

## 2020-11-03 DIAGNOSIS — E7800 Pure hypercholesterolemia, unspecified: Secondary | ICD-10-CM

## 2020-11-03 DIAGNOSIS — L739 Follicular disorder, unspecified: Secondary | ICD-10-CM | POA: Diagnosis not present

## 2020-11-03 DIAGNOSIS — I10 Essential (primary) hypertension: Secondary | ICD-10-CM | POA: Diagnosis not present

## 2020-11-03 DIAGNOSIS — I2510 Atherosclerotic heart disease of native coronary artery without angina pectoris: Secondary | ICD-10-CM | POA: Diagnosis not present

## 2020-11-03 DIAGNOSIS — I2583 Coronary atherosclerosis due to lipid rich plaque: Secondary | ICD-10-CM

## 2020-11-03 MED ORDER — ATORVASTATIN CALCIUM 20 MG PO TABS: 20.0000 mg | ORAL_TABLET | Freq: Every day | ORAL | 3 refills | Status: DC

## 2020-11-03 MED ORDER — AMOXICILLIN-POT CLAVULANATE 875-125 MG PO TABS: 1.0000 | ORAL_TABLET | Freq: Two times a day (BID) | ORAL | 0 refills | Status: DC

## 2020-11-03 MED ORDER — FUROSEMIDE 20 MG PO TABS: 20.0000 mg | ORAL_TABLET | Freq: Every day | ORAL | 3 refills | Status: DC

## 2020-11-03 NOTE — Patient Instructions (Signed)

## 2020-11-03 NOTE — Progress Notes (Signed)
Subjective: CC: f/u HTN, HLD PCP: Janora Norlander, DO Ashley [name] is a 74 y.o. female presenting to clinic today for:  1. HTN, HLD Patient reports compliance with her amlodipine, Lasix and Lipitor.  No chest pain, shortness of breath, edema.  2.  Vaginal lesion Patient reports about a 1 month history of a left-sided labial lesion.  She wears depends and feels that this may have caused.  She does shave intermittently.  Reports burning when the urine touches it but otherwise not had much issues.  She tried self I&D but did not have any successful extraction of fluid.   ROS: Per HPI  Allergies  Allergen Reactions  . Sulfa Antibiotics Swelling    Angoedema  . Demerol Nausea And Vomiting  . Lisinopril Swelling    angioedema . Uncertain if due to Sulfa or from ACE-I but ACE-I was discontinued to be safe.  Marland Kitchen Moviprep [Peg-Kcl-Nacl-Nasulf-Na Asc-C] Nausea And Vomiting  . Declomycin [Demeclocycline] Rash   Past Medical History:  Diagnosis Date  . Coronary artery disease 01/08/10   STATUS POST STENTING OF THE LAD AND THE FIRST OBTUSE MARGINAL VESSEL  . Diverticulitis   . Headache(784.0)   . Heart disease   . Herpes zoster   . Hypercholesterolemia   . Hyperlipidemia   . Phlebitis Right   Leg  . PONV (postoperative nausea and vomiting)   . Stomach ulcer     Current Outpatient Medications:  .  amLODipine (NORVASC) 2.5 MG tablet, Take 1 tablet (2.5 mg total) by mouth daily., Disp: 90 tablet, Rfl: 3 .  aspirin EC 81 MG tablet, Take 81 mg by mouth at bedtime., Disp: , Rfl:  .  atorvastatin (LIPITOR) 20 MG tablet, TAKE ONE TABLET AT BEDTIME, Disp: 90 tablet, Rfl: 1 .  Calcium Carb-Cholecalciferol (OS-CAL CALCIUM + D3 PO), Take 1 tablet by mouth daily. Calcium 1200 mg with Vitamin D, Disp: , Rfl:  .  cholecalciferol (VITAMIN D) 1000 units tablet, Take 1,000 Units by mouth daily., Disp: , Rfl:  .  diclofenac Sodium (VOLTAREN) 1 % GEL, Apply 2 g topically 4 (four) times  daily., Disp: 50 g, Rfl: 1 .  furosemide (LASIX) 20 MG tablet, TAKE 1 TABLET DAILY, Disp: 90 tablet, Rfl: 0 .  Garlic 10 MG CAPS, Take by mouth. One a day - otc, Disp: , Rfl:  .  Omega-3 Fatty Acids (FISH OIL) 1200 MG CAPS, Take 1 capsule by mouth every morning., Disp: , Rfl:  .  Psyllium (METAMUCIL PO), Take one (1) teaspoon by mouth daily at bedtime., Disp: , Rfl:  .  pyridoxine (B-6) 200 MG tablet, Take 200 mg daily by mouth., Disp: , Rfl:  .  vitamin B-12 (CYANOCOBALAMIN) 1000 MCG tablet, Take 1,000 mcg by mouth daily., Disp: , Rfl:  Social History   Socioeconomic History  . Marital status: Married    Spouse name: Berneta Sages  . Number of children: 5  . Years of education: GED  . Highest education level: GED or equivalent  Occupational History  . Occupation: retired  Tobacco Use  . Smoking status: Never Smoker  . Smokeless tobacco: Never Used  Vaping Use  . Vaping Use: Never used  Substance and Sexual Activity  . Alcohol use: Yes    Comment: Socially-wine  . Drug use: No  . Sexual activity: Yes    Birth control/protection: Surgical  Other Topics Concern  . Not on file  Social History Narrative   ** Merged History Encounter **  Social Determinants of Health   Financial Resource Strain: Not on file  Food Insecurity: Not on file  Transportation Needs: Not on file  Physical Activity: Not on file  Stress: Not on file  Social Connections: Not on file  Intimate Partner Violence: Not on file   Family History  Problem Relation Age of Onset  . Emphysema Father   . Colon cancer Father   . Lupus Child   . Heart disease Sister   . Heart disease Paternal Grandmother   . Hypertension Paternal Grandmother   . Heart disease Maternal Grandmother   . Hypertension Maternal Grandmother     Objective: Office vital signs reviewed. There were no vitals taken for this visit.  Physical Examination:  General: Awake, alert, well nourished, No acute distress Cardio: regular rate  and rhythm, S1S2 heard, no murmurs appreciated Pulm: clear to auscultation bilaterally, no wheezes, rhonchi or rales; normal work of breathing on room air GU: Mildly inflamed lesion noted at the mucosal ridge of the left labia majora.  There is mild tenderness palpation but no appreciable fluctuance or induration.  No active drainage.  Assessment/ Plan: 74 y.o. female   Essential hypertension - Plan: furosemide (LASIX) 20 MG tablet  Hypercholesterolemia - Plan: atorvastatin (LIPITOR) 20 MG tablet  Coronary artery disease due to lipid rich plaque  Folliculitis - Plan: amoxicillin-clavulanate (AUGMENTIN) 875-125 MG tablet  Blood pressure controlled.  Continue current regimen Continue statin.  Plan for fasting labs at next visit Lesion is likely folliculitis.  Start Augmentin given other drug allergies.  Warm compresses recommended.  She will follow-up as needed this issue  No orders of the defined types were placed in this encounter.  No orders of the defined types were placed in this encounter.    Raliegh Ip, DO [facility] [phone]

## 2020-11-04 ENCOUNTER — Ambulatory Visit (INDEPENDENT_AMBULATORY_CARE_PROVIDER_SITE_OTHER): Payer: Medicare Other | Admitting: *Deleted

## 2020-11-04 DIAGNOSIS — Z0000 Encounter for general adult medical examination without abnormal findings: Secondary | ICD-10-CM

## 2020-11-04 NOTE — Patient Instructions (Signed)
  [facility] Maintenance Summary and Written Plan of Care  Ms. Hurley ,  Thank you for allowing me to perform your Medicare Annual Wellness Visit and for your ongoing commitment to your health.   Health Maintenance & Immunization History Health Maintenance  Topic Date Due  . COVID-19 Vaccine (1) 11/19/2020 (Originally 10/30/1958)  . TETANUS/TDAP  05/04/2021 (Originally 10/31/1965)  . INFLUENZA VACCINE  Completed  . DEXA SCAN  Completed  . Hepatitis C Screening  Completed  . PNA vac Low Risk Adult  Completed   Immunization History  Administered Date(s) Administered  . Fluad Quad(high Dose 65+) 08/12/2020  . Influenza-Unspecified 08/16/2018  . Pneumococcal Conjugate-13 05/04/2020  . Pneumococcal Polysaccharide-23 08/18/2016    These are the patient goals that we discussed: Goals Addressed            This Visit's Progress   . Exercise 3x per week (30 min per time)       11/04/2020 AWV Goal: Exercise for General Health   Patient will verbalize understanding of the benefits of increased physical activity:  Exercising regularly is important. It will improve your overall fitness, flexibility, and endurance.  Regular exercise also will improve your overall health. It can help you control your weight, reduce stress, and improve your bone density.  Over the next year, patient will increase physical activity as tolerated with a goal of at least 150 minutes of moderate physical activity per week.   You can tell that you are exercising at a moderate intensity if your heart starts beating faster and you start breathing faster but can still hold a conversation.  Moderate-intensity exercise ideas include:  Walking 1 mile (1.6 km) in about 15 minutes  Biking  Hiking  Golfing  Dancing  Water aerobics  Patient will verbalize understanding of everyday activities that increase physical activity by providing examples like the following: ? Yard work,  such as: ? Pushing a Conservation officer, nature ? Raking and bagging leaves ? Washing your car ? Pushing a stroller ? Shoveling snow ? Gardening ? Washing windows or floors  Patient will be able to explain general safety guidelines for exercising:   Before you start a new exercise program, talk with your health care provider.  Do not exercise so much that you hurt yourself, feel dizzy, or get very short of breath.  Wear comfortable clothes and wear shoes with good support.  Drink plenty of water while you exercise to prevent dehydration or heat stroke.  Work out until your breathing and your heartbeat get faster.         This is a list of Health Maintenance Items that are overdue or due now: There are no preventive care reminders to display for this patient.   Orders/Referrals Placed Today: No orders of the defined types were placed in this encounter.  (Contact our referral department at [phone] if you have not spoken with someone about your referral appointment within the next 5 days)    Follow-up Plan Follow up with Dr. Lajuana Ripple as scheduled.

## 2020-11-04 NOTE — Progress Notes (Addendum)
MEDICARE ANNUAL WELLNESS VISIT  11/04/2020  Telephone Visit Disclaimer This Medicare AWV was conducted by telephone due to national recommendations for restrictions regarding the COVID-19 Pandemic (e.g. social distancing).  I verified, using two identifiers, that I am speaking with Ashley [name] or their authorized healthcare agent. I discussed the limitations, risks, security, and privacy concerns of performing an evaluation and management service by telephone and the potential availability of an in-person appointment in the future. The patient expressed understanding and agreed to proceed.  Location of Patient: Home Location of Provider (nurse):  Office  Subjective:    Ashley [name] is a 74 y.o. female patient of Janora Norlander, DO who had a Medicare Annual Wellness Visit today via telephone. Ashley [name] is Retired and lives with their spouse. she has 5 children. she reports that she is socially active and does interact with friends/family regularly. she is moderately physically active and enjoys dancing, Singing and bowling.  Patient Care Team: Janora Norlander, DO as PCP - General (Family Medicine) Gala Romney Cristopher Estimable, MD (Gastroenterology) Pcp, No  Advanced Directives 11/04/2020 06/04/2019 09/21/2016  Does Patient Have a Medical Advance Directive? Yes Yes No  Type of Paramedic of [place];Living will Living will -  Does patient want to make changes to medical advance directive? No - Patient declined No - Patient declined -  Copy of [place] in Chart? No - copy requested - -  Would patient like information on creating a medical advance directive? - - No - patient declined information    Hospital Utilization Over the Past 12 Months: # of hospitalizations or ER visits: 0 # of surgeries: 0  Review of Systems    Patient reports that her overall health is unchanged compared to last year.  History obtained from chart review and the  patient General ROS: negative  Patient Reported Readings (BP, Pulse, CBG, Weight, etc) none  Pain Assessment Pain : No/denies pain     Current Medications & Allergies (verified) Allergies as of 11/04/2020      Reactions   Sulfa Antibiotics Swelling   Angoedema   Demerol Nausea And Vomiting   Lisinopril Swelling   angioedema . Uncertain if due to Sulfa or from ACE-I but ACE-I was discontinued to be safe.   Moviprep [peg-kcl-nacl-nasulf-na Asc-c] Nausea And Vomiting   Declomycin [demeclocycline] Rash      Medication List       Accurate as of November 04, 2020  2:58 PM. If you have any questions, ask your nurse or doctor.        amLODipine 2.5 MG tablet Commonly known as: NORVASC Take 1 tablet (2.5 mg total) by mouth daily.   amoxicillin-clavulanate 875-125 MG tablet Commonly known as: AUGMENTIN Take 1 tablet by mouth 2 (two) times daily.   aspirin EC 81 MG tablet Take 81 mg by mouth at bedtime.   atorvastatin 20 MG tablet Commonly known as: LIPITOR Take 1 tablet (20 mg total) by mouth at bedtime.   cholecalciferol 1000 units tablet Commonly known as: VITAMIN D Take 1,000 Units by mouth daily.   diclofenac Sodium 1 % Gel Commonly known as: Voltaren Apply 2 g topically 4 (four) times daily.   Fish Oil 1200 MG Caps Take 1 capsule by mouth every morning.   furosemide 20 MG tablet Commonly known as: LASIX Take 1 tablet (20 mg total) by mouth daily.   Garlic 10 MG Caps Take by mouth. One a day - otc  METAMUCIL PO Take one (1) teaspoon by mouth daily at bedtime.   OS-CAL CALCIUM + D3 PO Take 1 tablet by mouth daily. Calcium 1200 mg with Vitamin D   pyridoxine 200 MG tablet Commonly known as: B-6 Take 200 mg daily by mouth.   vitamin B-12 1000 MCG tablet Commonly known as: CYANOCOBALAMIN Take 1,000 mcg by mouth daily.       History (reviewed): Past Medical History:  Diagnosis Date  . Coronary artery disease 01/08/10   STATUS POST STENTING OF  THE LAD AND THE FIRST OBTUSE MARGINAL VESSEL  . Diverticulitis   . Headache(784.0)   . Heart disease   . Herpes zoster   . Hypercholesterolemia   . Hyperlipidemia   . Phlebitis Right   Leg  . PONV (postoperative nausea and vomiting)   . Stomach ulcer    Past Surgical History:  Procedure Laterality Date  . ABDOMINAL HYSTERECTOMY     with bladder tack  . BREAST BIOPSY Left    Normal  . Bunionectomy Right   . CARDIAC CATHETERIZATION  01/08/10  . CARDIAC CATHETERIZATION  01/05/10   NORMAL LEFT VENTRICULAR SIZE AND NORMAL SYSTOLIC FUNCTION. EF IS 60%.   Marland Kitchen CATARACT EXTRACTION    . COLONOSCOPY  07/13/2011   RMR:Lax anal sphincter tone otherwise normal/left sided diverticula, polyp removed but not analyzed  . COLONOSCOPY N/A 09/21/2016   Procedure: COLONOSCOPY;  Surgeon: Daneil Dolin, MD;  Location: [facility];  Service: Endoscopy;  Laterality: N/A;  1:00 pm  . CORONARY STENT PLACEMENT    . VAGINAL HYSTERECTOMY    . VEIN LIGATION     Family History  Problem Relation Age of Onset  . Emphysema Father   . Colon cancer Father   . Lupus Child   . Heart disease Sister   . Heart disease Paternal Grandmother   . Hypertension Paternal Grandmother   . Heart disease Maternal Grandmother   . Hypertension Maternal Grandmother    Social History   Socioeconomic History  . Marital status: Married    Spouse name: Berneta Sages  . Number of children: 5  . Years of education: GED  . Highest education level: GED or equivalent  Occupational History  . Occupation: retired  Tobacco Use  . Smoking status: Never Smoker  . Smokeless tobacco: Never Used  Vaping Use  . Vaping Use: Never used  Substance and Sexual Activity  . Alcohol use: Yes    Comment: Socially-wine  . Drug use: No  . Sexual activity: Yes    Birth control/protection: Surgical  Other Topics Concern  . Not on file  Social History Narrative   ** Merged History Encounter **       Social Determinants of Health   Financial  Resource Strain: Low Risk   . Difficulty of Paying Living Expenses: Not hard at all  Food Insecurity: No Food Insecurity  . Worried About Charity fundraiser in the Last Year: Never true  . Ran Out of Food in the Last Year: Never true  Transportation Needs: No Transportation Needs  . Lack of Transportation (Medical): No  . Lack of Transportation (Non-Medical): No  Physical Activity: Sufficiently Active  . Days of Exercise per Week: 6 days  . Minutes of Exercise per Session: 30 min  Stress: No Stress Concern Present  . Feeling of Stress : Not at all  Social Connections: Socially Integrated  . Frequency of Communication with Friends and Family: More than three times a week  . Frequency  of Social Gatherings with Friends and Family: More than three times a week  . Attends Religious Services: More than 4 times per year  . Active Member of Clubs or Organizations: Yes  . Attends Archivist Meetings: More than 4 times per year  . Marital Status: Married    Activities of Daily Living In your present state of health, do you have any difficulty performing the following activities: 11/04/2020  Hearing? N  Vision? N  Difficulty concentrating or making decisions? N  Walking or climbing stairs? N  Dressing or bathing? N  Doing errands, shopping? N  Preparing Food and eating ? N  Using the Toilet? N  In the past six months, have you accidently leaked urine? N  Do you have problems with loss of bowel control? N  Managing your Medications? N  Managing your Finances? N  Housekeeping or managing your Housekeeping? N  Some recent data might be hidden    Patient Education/ Literacy How often do you need to have someone help you when you read instructions, pamphlets, or other written materials from your doctor or pharmacy?: 1 - Never What is the last grade level you completed in school?: GED  Exercise Current Exercise Habits: Home exercise routine, Type of exercise: Other - see  comments (Line Dancing), Time (Minutes): 30, Frequency (Times/Week): 7, Weekly Exercise (Minutes/Week): 210, Intensity: Moderate, Exercise limited by: None identified  Diet Patient reports consuming 3 meals a day and 1 snack(s) a day Patient reports that her primary diet is: Regular Patient reports that she does have regular access to food.   Depression Screen PHQ 2/9 Scores 11/04/2020 11/04/2020 11/03/2020 08/31/2020 05/04/2020 01/27/2020 06/04/2019  PHQ - 2 Score 0 0 0 0 0 0 0  PHQ- 9 Score - - - - 0 - -     Fall Risk Fall Risk  11/04/2020 11/03/2020 08/31/2020 05/04/2020 01/27/2020  Falls in the past year? 0 0 0 0 0  Number falls in past yr: 0 - - - -  Injury with Fall? 0 - - - -  Risk for fall due to : No Fall Risks - - - -  Follow up Falls evaluation completed - - - -     Objective:  Ashley [name] seemed alert and oriented and she participated appropriately during our telephone visit.  Blood Pressure Weight BMI  BP Readings from Last 3 Encounters:  11/03/20 (!) 116/53  08/31/20 136/66  05/04/20 138/63   Wt Readings from Last 3 Encounters:  11/03/20 144 lb 6.4 oz (65.5 kg)  08/31/20 142 lb 3.2 oz (64.5 kg)  05/04/20 142 lb (64.4 kg)   BMI Readings from Last 1 Encounters:  11/03/20 27.28 kg/m    *Unable to obtain current vital signs, weight, and BMI due to telephone visit type  Hearing/Vision  . Roene did not seem to have difficulty with hearing/understanding during the telephone conversation . Reports that she has had a formal eye exam by an eye care professional within the past year . Reports that she has not had a formal hearing evaluation within the past year *Unable to fully assess hearing and vision during telephone visit type  Cognitive Function: 6CIT Screen 11/04/2020 06/04/2019  What Year? 0 points 0 points  What month? 0 points 0 points  What time? 0 points 0 points  Count back from 20 0 points 0 points  Months in reverse 0 points 0 points  Repeat phrase 0  points 4 points  Total Score 0  4   (Normal:0-7, Significant for Dysfunction: >8)  Normal Cognitive Function Screening: Yes   Immunization & Health Maintenance Record Immunization History  Administered Date(s) Administered  . Fluad Quad(high Dose 65+) 08/12/2020  . Influenza-Unspecified 08/16/2018  . Pneumococcal Conjugate-13 05/04/2020  . Pneumococcal Polysaccharide-23 08/18/2016    Health Maintenance  Topic Date Due  . COVID-19 Vaccine (1) 11/19/2020 (Originally 10/30/1958)  . TETANUS/TDAP  05/04/2021 (Originally 10/31/1965)  . INFLUENZA VACCINE  Completed  . DEXA SCAN  Completed  . Hepatitis C Screening  Completed  . PNA vac Low Risk Adult  Completed       Assessment  This is a routine wellness examination for LEXIUS BEX.  Health Maintenance: Due or Overdue There are no preventive care reminders to display for this patient.  Ashley [name] does not need a referral for Community Assistance: Care Management:   no Social Work:    no Prescription Assistance:  no Nutrition/Diabetes Education:  no   Plan:  Personalized Goals Goals Addressed            This Visit's Progress   . Exercise 3x per week (30 min per time)       11/04/2020 AWV Goal: Exercise for General Health   Patient will verbalize understanding of the benefits of increased physical activity:  Exercising regularly is important. It will improve your overall fitness, flexibility, and endurance.  Regular exercise also will improve your overall health. It can help you control your weight, reduce stress, and improve your bone density.  Over the next year, patient will increase physical activity as tolerated with a goal of at least 150 minutes of moderate physical activity per week.   You can tell that you are exercising at a moderate intensity if your heart starts beating faster and you start breathing faster but can still hold a conversation.  Moderate-intensity exercise ideas include:  Walking  1 mile (1.6 km) in about 15 minutes  Biking  Hiking  Golfing  Dancing  Water aerobics  Patient will verbalize understanding of everyday activities that increase physical activity by providing examples like the following: ? Yard work, such as: ? Pushing a Conservation officer, nature ? Raking and bagging leaves ? Washing your car ? Pushing a stroller ? Shoveling snow ? Gardening ? Washing windows or floors  Patient will be able to explain general safety guidelines for exercising:   Before you start a new exercise program, talk with your health care provider.  Do not exercise so much that you hurt yourself, feel dizzy, or get very short of breath.  Wear comfortable clothes and wear shoes with good support.  Drink plenty of water while you exercise to prevent dehydration or heat stroke.  Work out until your breathing and your heartbeat get faster.       Personalized Health Maintenance & Screening Recommendations  Advanced directives: has an advanced directive - a copy HAS NOT been provided.  Lung Cancer Screening Recommended: no (Low Dose CT Chest recommended if Age 46-80 years, 30 pack-year currently smoking OR have quit w/in past 15 years) Hepatitis C Screening recommended: no HIV Screening recommended: no  Advanced Directives: Written information was not prepared per patient's request.  Referrals & Orders No orders of the defined types were placed in this encounter.   Follow-up Plan . Follow-up with Janora Norlander, DO as planned    I have personally reviewed and noted the following in the patient's chart:   . Medical and social history . Use  of alcohol, tobacco or illicit drugs  . Current medications and supplements . Functional ability and status . Nutritional status . Physical activity . Advanced directives . List of other physicians . Hospitalizations, surgeries, and ER visits in previous 12 months . Vitals . Screenings to include cognitive, depression, and  falls . Referrals and appointments  In addition, I have reviewed and discussed with Ashley [name] certain preventive protocols, quality metrics, and best practice recommendations. A written personalized care plan for preventive services as well as general preventive health recommendations is available and can be mailed to the patient at her request.      Wardell Heath, LPN  [id number]   I have read nurse Chariti Havel's note and agree with her assessment and plan.  Patient to continue following with PCP for ongoing chronic medical problems.  Ashly M. Lajuana Ripple, [facility]      AVS printed and mailed to patient

## 2021-02-09 ENCOUNTER — Other Ambulatory Visit: Payer: Self-pay | Admitting: Family Medicine

## 2021-02-09 DIAGNOSIS — I10 Essential (primary) hypertension: Secondary | ICD-10-CM

## 2021-03-31 ENCOUNTER — Other Ambulatory Visit: Payer: Self-pay

## 2021-03-31 ENCOUNTER — Encounter: Payer: Self-pay | Admitting: Nurse Practitioner

## 2021-03-31 ENCOUNTER — Ambulatory Visit: Payer: Medicare Other | Admitting: Nurse Practitioner

## 2021-03-31 VITALS — BP 136/63 | HR 78 | Temp 98.3°F | Ht 61.0 in | Wt 140.0 lb

## 2021-03-31 DIAGNOSIS — W57XXXA Bitten or stung by nonvenomous insect and other nonvenomous arthropods, initial encounter: Secondary | ICD-10-CM | POA: Insufficient documentation

## 2021-03-31 DIAGNOSIS — S70361A Insect bite (nonvenomous), right thigh, initial encounter: Secondary | ICD-10-CM | POA: Diagnosis not present

## 2021-03-31 NOTE — Assessment & Plan Note (Signed)
Patient presents with tick bite to right upper thigh.  Unable to tell how long tick was attached to skin.  On assessment skin is red but no classic bull's-eye.  Advised patient to keep an eye on tick bite area, apply cool compress, ibuprofen or Tylenol for pain.  Patient is unable to take doxycycline prophylaxis at this time due to allergies.  Azithromycin topical cream is an alternative when the patient cannot use doxycycline. Advised patient to return with any flulike symptoms, and  complete Lyme abs/Western blot labs in 2 to 3 weeks to rule out Lyme disease. Lab order placed for future. Education provided with printed handouts given.  Follow-up with worsening or unresolved symptoms.

## 2021-03-31 NOTE — Patient Instructions (Signed)
Tick Bite Information, Adult  Ticks are insects that can bite. Most ticks live in shrubs and grassy areas. They climb onto people and animals that go by. Then they bite. Some ticks carry germs that can make you sick. How can I prevent tick bites? Take these steps: Use insect repellent  Use an insect repellent that has 20% or higher of the ingredients DEET, picaridin, or IR3535. Follow the instructions on the label. Put it on: ? Bare skin. ? The tops of your boots. ? Your pant legs. ? The ends of your sleeves.  If you use an insect repellent that has the ingredient permethrin, follow the instructions on the label. Put it on: ? Clothing. ? Boots. ? Supplies or outdoor gear. ? Tents. When you are outside  Wear long sleeves and long pants.  Wear light-colored clothes.  Tuck your pant legs into your socks.  Stay in the middle of the trail. Do not touch the bushes.  Avoid walking through long grass.  Check for ticks on your clothes, hair, and skin often while you are outside. Before going inside your house, check your clothes, skin, head, neck, armpits, waist, groin, and joint areas. When you go indoors  Check your clothes for ticks. Dry your clothes in a dryer on high heat for 10 minutes or more. If clothes are damp, additional time may be needed.  Wash your clothes right away if they need to be washed. Use hot water.  Check your pets and outdoor gear.  Shower right away.  Check your body for ticks. Do a full body check using a mirror. What is the right way to remove a tick? Remove the tick from your skin as soon as possible. Do not remove the tick with your bare fingers.  To remove a tick that is crawling on your skin: ? Go outdoors and brush the tick off. ? Use tape or a lint roller.  To remove a tick that is biting: 1. Wash your hands. 2. If you have latex gloves, put them on. 3. Use tweezers, curved forceps, or a tick-removal tool to grasp the tick. Grasp the tick  as close to your skin and as close to the tick's head as possible. 4. Gently pull up until the tick lets go.  Try to keep the tick's head attached to its body.  Do not twist or jerk the tick.  Do not squeeze or crush the tick. Do not try to remove a tick with heat, alcohol, petroleum jelly, or fingernail polish.   What should I do after taking out a tick?  Throw away the tick. Do not crush a tick with your fingers.  Clean the bite area and your hands with soap and water, rubbing alcohol, or an iodine wash.  If an antiseptic cream or ointment is available, apply a small amount to the bite area.  Wash and disinfect any instruments that you used to remove the tick. How should I get rid of a live tick? To dispose of a live tick, use one of these methods:  Place the tick in rubbing alcohol.  Place the tick in a bag or container you can close tightly.  Wrap the tick tightly in tape.  Flush the tick down the toilet. Contact a doctor if:  You have symptoms, such as: ? A fever or chills. ? A red rash that makes a circle (bull's-eye rash) in the bite area. ? Redness and swelling where the tick bit you. ? Headache. ?  Pain in a muscle, joint, or bone. ? Being more tired than normal. ? Trouble walking or moving your legs. ? Numbness in your legs. ? Tender and swollen lymph glands.  A part of a tick breaks off and gets stuck in your skin. Get help right away if:  You cannot remove a tick.  You cannot move (have paralysis) or feel weak.  You are feeling worse or have new symptoms.  You find a tick that is biting you and filled with blood. This is important if you are in an area where diseases from ticks are common. Summary  Ticks may carry germs that can make you sick.  To prevent tick bites wear long sleeves, long pants, and light colors. Use insect repellent. Follow the instructions on the label.  If the tick is biting, do not try to remove it with heat, alcohol, petroleum  jelly, or fingernail polish.  Use tweezers, curved forceps, or a tick-removal tool to grasp the tick. Gently pull up until the tick lets go. Do not twist or jerk the tick. Do not squeeze or crush the tick.  If you have symptoms, contact a doctor. This information is not intended to replace advice given to you by your health care provider. Make sure you discuss any questions you have with your health care provider. Document Revised: 10/28/2019 Document Reviewed: 10/28/2019 Elsevier Patient Education  2021 [employer].

## 2021-03-31 NOTE — Progress Notes (Signed)
Acute Office Visit  Subjective:    Patient ID: Ashley [name], female    DOB: February 09, 1946, 75 y.o.   MRN: [id number]  Chief Complaint  Patient presents with  . Tick Removal    HPI Patient is 75 year old female who presents to clinic for tick bite.  Incident happened 4 days ago.  Right inner thigh.  Patient is reporting erythema and increased itching.  No fever, nausea, body aches or headache.  Past Medical History:  Diagnosis Date  . Coronary artery disease 01/08/10   STATUS POST STENTING OF THE LAD AND THE FIRST OBTUSE MARGINAL VESSEL  . Diverticulitis   . Headache(784.0)   . Heart disease   . Herpes zoster   . Hypercholesterolemia   . Hyperlipidemia   . Phlebitis Right   Leg  . PONV (postoperative nausea and vomiting)   . Stomach ulcer     Past Surgical History:  Procedure Laterality Date  . ABDOMINAL HYSTERECTOMY     with bladder tack  . BREAST BIOPSY Left    Normal  . Bunionectomy Right   . CARDIAC CATHETERIZATION  01/08/10  . CARDIAC CATHETERIZATION  01/05/10   NORMAL LEFT VENTRICULAR SIZE AND NORMAL SYSTOLIC FUNCTION. EF IS 60%.   Marland Kitchen CATARACT EXTRACTION    . COLONOSCOPY  07/13/2011   RMR:Lax anal sphincter tone otherwise normal/left sided diverticula, polyp removed but not analyzed  . COLONOSCOPY N/A 09/21/2016   Procedure: COLONOSCOPY;  Surgeon: Daneil Dolin, MD;  Location: [facility];  Service: Endoscopy;  Laterality: N/A;  1:00 pm  . CORONARY STENT PLACEMENT    . VAGINAL HYSTERECTOMY    . VEIN LIGATION      Family History  Problem Relation Age of Onset  . Emphysema Father   . Colon cancer Father   . Lupus Child   . Heart disease Sister   . Heart disease Paternal Grandmother   . Hypertension Paternal Grandmother   . Heart disease Maternal Grandmother   . Hypertension Maternal Grandmother     Social History   Socioeconomic History  . Marital status: Married    Spouse name: Berneta Sages  . Number of children: 5  . Years of education: GED  .  Highest education level: GED or equivalent  Occupational History  . Occupation: retired  Tobacco Use  . Smoking status: Never Smoker  . Smokeless tobacco: Never Used  Vaping Use  . Vaping Use: Never used  Substance and Sexual Activity  . Alcohol use: Yes    Comment: Socially-wine  . Drug use: No  . Sexual activity: Yes    Birth control/protection: Surgical  Other Topics Concern  . Not on file  Social History Narrative   ** Merged History Encounter **       Social Determinants of Health   Financial Resource Strain: Low Risk   . Difficulty of Paying Living Expenses: Not hard at all  Food Insecurity: No Food Insecurity  . Worried About Charity fundraiser in the Last Year: Never true  . Ran Out of Food in the Last Year: Never true  Transportation Needs: No Transportation Needs  . Lack of Transportation (Medical): No  . Lack of Transportation (Non-Medical): No  Physical Activity: Sufficiently Active  . Days of Exercise per Week: 6 days  . Minutes of Exercise per Session: 30 min  Stress: No Stress Concern Present  . Feeling of Stress : Not at all  Social Connections: Socially Integrated  . Frequency of Communication with Friends and  Family: More than three times a week  . Frequency of Social Gatherings with Friends and Family: More than three times a week  . Attends Religious Services: More than 4 times per year  . Active Member of Clubs or Organizations: Yes  . Attends Archivist Meetings: More than 4 times per year  . Marital Status: Married  Human resources officer Violence: Not At Risk  . Fear of Current or Ex-Partner: No  . Emotionally Abused: No  . Physically Abused: No  . Sexually Abused: No    Outpatient Medications Prior to Visit  Medication Sig Dispense Refill  . amLODipine (NORVASC) 2.5 MG tablet Take 1 tablet (2.5 mg total) by mouth daily. 90 tablet 3  . amoxicillin-clavulanate (AUGMENTIN) 875-125 MG tablet Take 1 tablet by mouth 2 (two) times daily. 20  tablet 0  . aspirin EC 81 MG tablet Take 81 mg by mouth at bedtime.    Marland Kitchen atorvastatin (LIPITOR) 20 MG tablet Take 1 tablet (20 mg total) by mouth at bedtime. 90 tablet 3  . Calcium Carb-Cholecalciferol (OS-CAL CALCIUM + D3 PO) Take 1 tablet by mouth daily. Calcium 1200 mg with Vitamin D    . cholecalciferol (VITAMIN D) 1000 units tablet Take 1,000 Units by mouth daily.    . diclofenac Sodium (VOLTAREN) 1 % GEL Apply 2 g topically 4 (four) times daily. 50 g 1  . furosemide (LASIX) 20 MG tablet Take 1 tablet (20 mg total) by mouth daily. 90 tablet 3  . Garlic 10 MG CAPS Take by mouth. One a day - otc    . Omega-3 Fatty Acids (FISH OIL) 1200 MG CAPS Take 1 capsule by mouth every morning.    . Psyllium (METAMUCIL PO) Take one (1) teaspoon by mouth daily at bedtime.    . pyridoxine (B-6) 200 MG tablet Take 200 mg daily by mouth.    . vitamin B-12 (CYANOCOBALAMIN) 1000 MCG tablet Take 1,000 mcg by mouth daily.     No facility-administered medications prior to visit.    Allergies  Allergen Reactions  . Sulfa Antibiotics Swelling    Angoedema  . Demerol Nausea And Vomiting  . Lisinopril Swelling    angioedema . Uncertain if due to Sulfa or from ACE-I but ACE-I was discontinued to be safe.  Marland Kitchen Moviprep [Peg-Kcl-Nacl-Nasulf-Na Asc-C] Nausea And Vomiting  . Declomycin [Demeclocycline] Rash    Review of Systems  Constitutional: Negative.   HENT: Negative.   Respiratory: Negative.   Gastrointestinal: Negative.   Genitourinary: Negative.   Musculoskeletal: Negative.   Skin: Positive for color change and rash.  All other systems reviewed and are negative.      Objective:    Physical Exam Vitals and nursing note reviewed.  Constitutional:      Appearance: Normal appearance.  HENT:     Head: Normocephalic.     Nose: Nose normal.  Eyes:     Conjunctiva/sclera: Conjunctivae normal.  Cardiovascular:     Rate and Rhythm: Normal rate and regular rhythm.     Pulses: Normal pulses.      Heart sounds: Normal heart sounds.  Pulmonary:     Effort: Pulmonary effort is normal.     Breath sounds: Normal breath sounds.  Abdominal:     General: Bowel sounds are normal.  Skin:    Findings: Erythema and rash present.  Neurological:     Mental Status: She is alert and oriented to person, place, and time.  Psychiatric:  Behavior: Behavior normal.     BP 136/63   Pulse 78   Temp 98.3 F (36.8 C) (Temporal)   Ht 5\' 1"  (1.549 m)   Wt 140 lb (63.5 kg)   SpO2 97%   BMI 26.45 kg/m  Wt Readings from Last 3 Encounters:  03/31/21 140 lb (63.5 kg)  11/03/20 144 lb 6.4 oz (65.5 kg)  08/31/20 142 lb 3.2 oz (64.5 kg)    Health Maintenance Due  Topic Date Due  . COVID-19 Vaccine (1) Never done    There are no preventive care reminders to display for this patient.   No results found for: TSH Lab Results  Component Value Date   WBC 7.9 03/18/2020   HGB 13.1 03/18/2020   HCT 38.2 03/18/2020   MCV 89 03/18/2020   PLT 223 03/18/2020   Lab Results  Component Value Date   NA 142 03/18/2020   K 4.1 03/18/2020   CO2 28 03/18/2020   GLUCOSE 135 (H) 03/18/2020   BUN 14 03/18/2020   CREATININE 0.85 03/18/2020   BILITOT 0.3 03/18/2020   ALKPHOS 64 03/18/2020   AST 31 03/18/2020   ALT 29 03/18/2020   PROT 6.3 03/18/2020   ALBUMIN 4.4 03/18/2020   CALCIUM 9.1 03/18/2020   Lab Results  Component Value Date   CHOL 172 03/18/2020   Lab Results  Component Value Date   HDL 65 03/18/2020   Lab Results  Component Value Date   LDLCALC 86 03/18/2020   Lab Results  Component Value Date   TRIG 122 03/18/2020   Lab Results  Component Value Date   CHOLHDL 2.6 03/18/2020   Lab Results  Component Value Date   HGBA1C 6.0 05/04/2020       Assessment & Plan:   Problem List Items Addressed This Visit      Musculoskeletal and Integument   Tick bite of right thigh - Primary    Patient presents with tick bite to right upper thigh.  Unable to tell how long tick  was attached to skin.  On assessment skin is red but no classic bull's-eye.  Advised patient to keep an eye on tick bite area, apply cool compress, ibuprofen or Tylenol for pain.  Patient is unable to take doxycycline prophylaxis at this time due to allergies.  Azithromycin topical cream is an alternative when the patient cannot use doxycycline. Advised patient to return with any flulike symptoms, and  complete Lyme abs/Western blot labs in 2 to 3 weeks to rule out Lyme disease. Lab order placed for future. Education provided with printed handouts given.  Follow-up with worsening or unresolved symptoms.      Relevant Orders   Lyme Disease Serology w/Reflex       No orders of the defined types were placed in this encounter.    Ivy Lynn, NP

## 2021-04-08 ENCOUNTER — Encounter: Payer: Self-pay | Admitting: Family

## 2021-04-08 ENCOUNTER — Ambulatory Visit (INDEPENDENT_AMBULATORY_CARE_PROVIDER_SITE_OTHER): Payer: Medicare Other | Admitting: Family

## 2021-04-08 DIAGNOSIS — Z20822 Contact with and (suspected) exposure to covid-19: Secondary | ICD-10-CM

## 2021-04-08 MED ORDER — MOLNUPIRAVIR EUA 200MG CAPSULE: 4.0000 | ORAL_CAPSULE | Freq: Two times a day (BID) | ORAL | 0 refills | Status: AC

## 2021-04-08 NOTE — Progress Notes (Signed)
   Virtual Visit  Note Due to COVID-19 pandemic this visit was conducted virtually. This visit type was conducted due to national recommendations for restrictions regarding the COVID-19 Pandemic (e.g. social distancing, sheltering in place) in an effort to limit this patient's exposure and mitigate transmission in our community. All issues noted in this document were discussed and addressed.  A physical exam was not performed with this format.  I connected with Ashley [name] on 04/08/21 at 2:05 pm  by telephone and verified that I am speaking with the correct person using two identifiers. Ashley [name] is currently located at home and no one is currently with her during visit. The provider, Evelina Dun, FNP is located in their office at time of visit.  I discussed the limitations, risks, security and privacy concerns of performing an evaluation and management service by telephone and the availability of in person appointments. I also discussed with the patient that there may be a patient responsible charge related to this service. The patient expressed understanding and agreed to proceed.   History and Present Illness:  Cough This is a new problem. The current episode started yesterday. The problem has been gradually worsening. The problem occurs every few minutes. The cough is productive of sputum. Associated symptoms include chills, a fever (99 F), headaches, myalgias, nasal congestion and postnasal drip. Pertinent negatives include no ear congestion, ear pain, shortness of breath or wheezing. She has tried rest for the symptoms. The treatment provided mild relief.      Review of Systems  Constitutional: Positive for chills and fever (99 F).  HENT: Positive for postnasal drip. Negative for ear pain.   Respiratory: Positive for cough. Negative for shortness of breath and wheezing.   Musculoskeletal: Positive for myalgias.  Neurological: Positive for headaches.      Observations/Objective: No SOB or distress noted, coarse cough  Assessment and Plan: 1. Contact with and (suspected) exposure to covid-19 COVID test pending, rest, force fluids, tylenol as needed, Quarantine for at least 5 days and fever free, report any worsening symptoms such as increased shortness of breath, swelling, or continued high fevers.  - Novel Coronavirus, NAA ([facility]) - molnupiravir EUA 200 mg CAPS; Take 4 capsules (800 mg total) by mouth 2 (two) times daily for 5 days.  Dispense: 40 capsule; Refill: 0     I discussed the assessment and treatment plan with the patient. The patient was provided an opportunity to ask questions and all were answered. The patient agreed with the plan and demonstrated an understanding of the instructions.   The patient was advised to call back or seek an in-person evaluation if the symptoms worsen or if the condition fails to improve as anticipated.  The above assessment and management plan was discussed with the patient. The patient verbalized understanding of and has agreed to the management plan. Patient is aware to call the clinic if symptoms persist or worsen. Patient is aware when to return to the clinic for a follow-up visit. Patient educated on when it is appropriate to go to the emergency department.   Time call ended:  2:17  pm  I provided 12 minutes of  non face-to-face time during this encounter.    Evelina Dun, FNP

## 2021-04-09 LAB — NOVEL CORONAVIRUS, NAA: SARS-CoV-2, NAA: DETECTED — AB

## 2021-04-09 LAB — SARS-COV-2, NAA 2 DAY TAT

## 2021-05-10 ENCOUNTER — Other Ambulatory Visit: Payer: Self-pay

## 2021-05-10 ENCOUNTER — Other Ambulatory Visit: Payer: Medicare Other

## 2021-05-10 ENCOUNTER — Other Ambulatory Visit: Payer: Self-pay | Admitting: Nurse Practitioner

## 2021-05-10 NOTE — Addendum Note (Signed)
Addended by: Ivy Lynn on: 05/10/2021 01:14 PM   Modules accepted: Orders

## 2021-05-12 LAB — LYME DISEASE SEROLOGY W/REFLEX: Lyme Total Antibody EIA: NEGATIVE

## 2021-05-25 ENCOUNTER — Other Ambulatory Visit (HOSPITAL_COMMUNITY): Payer: Self-pay | Admitting: Family Medicine

## 2021-05-25 DIAGNOSIS — Z1231 Encounter for screening mammogram for malignant neoplasm of breast: Secondary | ICD-10-CM

## 2021-06-09 ENCOUNTER — Encounter: Payer: Self-pay | Admitting: Nurse Practitioner

## 2021-06-09 ENCOUNTER — Ambulatory Visit: Payer: Medicare Other | Admitting: Nurse Practitioner

## 2021-06-09 VITALS — BP 138/65 | HR 74 | Temp 97.9°F | Wt 140.0 lb

## 2021-06-09 DIAGNOSIS — R21 Rash and other nonspecific skin eruption: Secondary | ICD-10-CM | POA: Insufficient documentation

## 2021-06-09 DIAGNOSIS — W57XXXA Bitten or stung by nonvenomous insect and other nonvenomous arthropods, initial encounter: Secondary | ICD-10-CM | POA: Diagnosis not present

## 2021-06-09 DIAGNOSIS — S80862A Insect bite (nonvenomous), left lower leg, initial encounter: Secondary | ICD-10-CM

## 2021-06-09 MED ORDER — HYDROCORTISONE 1 % EX LOTN: 1.0000 "application " | TOPICAL_LOTION | Freq: Two times a day (BID) | CUTANEOUS | 0 refills | Status: DC

## 2021-06-09 NOTE — Patient Instructions (Signed)
Tick Bite Information, Adult  Ticks are insects that can bite. Most ticks live in shrubs and grassy areas. They climb onto people and animals that go by. Then they bite. Some ticks carrygerms that can make you sick. How can I prevent tick bites? Take these steps: Use insect repellent Use an insect repellent that has 20% or higher of the ingredients DEET, picaridin, or IR3535. Follow the instructions on the label. Put it on: Bare skin. The tops of your boots. Your pant legs. The ends of your sleeves. If you use an insect repellent that has the ingredient permethrin, follow the instructions on the label. Put it on: Clothing. Boots. Supplies or outdoor gear. Tents. When you are outside Wear long sleeves and long pants. Wear light-colored clothes. Tuck your pant legs into your socks. Stay in the middle of the trail. Do not touch the bushes. Avoid walking through long grass. Check for ticks on your clothes, hair, and skin often while you are outside. Before going inside your house, check your clothes, skin, head, neck, armpits, waist, groin, and joint areas. When you go indoors Check your clothes for ticks. Dry your clothes in a dryer on high heat for 10 minutes or more. If clothes are damp, additional time may be needed. Wash your clothes right away if they need to be washed. Use hot water. Check your pets and outdoor gear. Shower right away. Check your body for ticks. Do a full body check using a mirror. What is the right way to remove a tick? Remove the tick from your skin as soon as possible. Do not remove the tick with your bare fingers. To remove a tick that is crawling on your skin: Go outdoors and brush the tick off. Use tape or a lint roller. To remove a tick that is biting: Wash your hands. If you have latex gloves, put them on. Use tweezers, curved forceps, or a tick-removal tool to grasp the tick. Grasp the tick as close to your skin and as close to the tick's head as  possible. Gently pull up until the tick lets go. Try to keep the tick's head attached to its body. Do not twist or jerk the tick. Do not squeeze or crush the tick. Do not try to remove a tick with heat, alcohol, petroleum jelly, or fingernail polish. What should I do after taking out a tick? Throw away the tick. Do not crush a tick with your fingers. Clean the bite area and your hands with soap and water, rubbing alcohol, or an iodine wash. If an antiseptic cream or ointment is available, apply a small amount to the bite area. Wash and disinfect any instruments that you used to remove the tick. How should I get rid of a live tick? To dispose of a live tick, use one of these methods: Place the tick in rubbing alcohol. Place the tick in a bag or container you can close tightly. Wrap the tick tightly in tape. Flush the tick down the toilet. Contact a doctor if: You have symptoms, such as: A fever or chills. A red rash that makes a circle (bull's-eye rash) in the bite area. Redness and swelling where the tick bit you. Headache. Pain in a muscle, joint, or bone. Being more tired than normal. Trouble walking or moving your legs. Numbness in your legs. Tender and swollen lymph glands. A part of a tick breaks off and gets stuck in your skin. Get help right away if: You cannot remove a   tick. You cannot move (have paralysis) or feel weak. You are feeling worse or have new symptoms. You find a tick that is biting you and filled with blood. This is important if you are in an area where diseases from ticks are common. Summary Ticks may carry germs that can make you sick. To prevent tick bites wear long sleeves, long pants, and light colors. Use insect repellent. Follow the instructions on the label. If the tick is biting, do not try to remove it with heat, alcohol, petroleum jelly, or fingernail polish. Use tweezers, curved forceps, or a tick-removal tool to grasp the tick. Gently pull up  until the tick lets go. Do not twist or jerk the tick. Do not squeeze or crush the tick. If you have symptoms, contact a doctor. This information is not intended to replace advice given to you by your health care provider. Make sure you discuss any questions you have with your healthcare provider. Document Revised: 10/28/2019 Document Reviewed: 10/28/2019 [employer] Patient Education  2022 [employer]  

## 2021-06-09 NOTE — Progress Notes (Signed)
Acute Office Visit  Subjective:    Patient ID: Ashley [name], female    DOB: 1946/07/29, 75 y.o.   MRN: [id number]  Chief Complaint  Patient presents with   Insect Bite    Rash This is a new problem. The current episode started yesterday. The problem has been gradually worsening since onset. The affected locations include the left lower leg. The rash is characterized by redness and itchiness. Associated with: insect bite. Pertinent negatives include no anorexia, congestion, cough, fatigue, fever or sore throat. Past treatments include nothing.   Patient is also reporting tick bites in the last 24 hours.  Skin around the area is red but mostly from patient trying to remove tick.  Advised patient to watch and wait for changes in skin around bite.  Signs of fever and body aches/flulike symptoms.  Tick was stuck on his skin for less than 24 hours.   Past Medical History:  Diagnosis Date   Coronary artery disease 01/08/10   STATUS POST STENTING OF THE LAD AND THE FIRST OBTUSE MARGINAL VESSEL   Diverticulitis    Headache(784.0)    Heart disease    Herpes zoster    Hypercholesterolemia    Hyperlipidemia    Phlebitis Right   Leg   PONV (postoperative nausea and vomiting)    Stomach ulcer     Past Surgical History:  Procedure Laterality Date   ABDOMINAL HYSTERECTOMY     with bladder tack   BREAST BIOPSY Left    Normal   Bunionectomy Right    CARDIAC CATHETERIZATION  01/08/10   CARDIAC CATHETERIZATION  01/05/10   NORMAL LEFT VENTRICULAR SIZE AND NORMAL SYSTOLIC FUNCTION. EF IS 60%.    CATARACT EXTRACTION     COLONOSCOPY  07/13/2011   RMR:Lax anal sphincter tone otherwise normal/left sided diverticula, polyp removed but not analyzed   COLONOSCOPY N/A 09/21/2016   Procedure: COLONOSCOPY;  Surgeon: Ashley Dolin, MD;  Location: [facility];  Service: Endoscopy;  Laterality: N/A;  1:00 pm   CORONARY STENT PLACEMENT     VAGINAL HYSTERECTOMY     VEIN LIGATION      Family  History  Problem Relation Age of Onset   Emphysema Father    Colon cancer Father    Lupus Child    Heart disease Sister    Heart disease Paternal Grandmother    Hypertension Paternal Grandmother    Heart disease Maternal Grandmother    Hypertension Maternal Grandmother     Social History   Socioeconomic History   Marital status: Married    Spouse name: Berneta Sages   Number of children: 5   Years of education: GED   Highest education level: GED or equivalent  Occupational History   Occupation: retired  Tobacco Use   Smoking status: Never   Smokeless tobacco: Never  Vaping Use   Vaping Use: Never used  Substance and Sexual Activity   Alcohol use: Yes    Comment: Socially-wine   Drug use: No   Sexual activity: Yes    Birth control/protection: Surgical  Other Topics Concern   Not on file  Social History Narrative   ** Merged History Encounter **       Social Determinants of Health   Financial Resource Strain: Low Risk    Difficulty of Paying Living Expenses: Not hard at all  Food Insecurity: No Food Insecurity   Worried About Charity fundraiser in the Last Year: Never true   [employer]  in the Last Year: Never true  Transportation Needs: No Transportation Needs   Lack of Transportation (Medical): No   Lack of Transportation (Non-Medical): No  Physical Activity: Sufficiently Active   Days of Exercise per Week: 6 days   Minutes of Exercise per Session: 30 min  Stress: No Stress Concern Present   Feeling of Stress : Not at all  Social Connections: Socially Integrated   Frequency of Communication with Friends and Family: More than three times a week   Frequency of Social Gatherings with Friends and Family: More than three times a week   Attends Religious Services: More than 4 times per year   Active Member of [employer] or Organizations: Yes   Attends Music therapist: More than 4 times per year   Marital Status: Married  Human resources officer Violence: Not At  Risk   Fear of Current or Ex-Partner: No   Emotionally Abused: No   Physically Abused: No   Sexually Abused: No    Outpatient Medications Prior to Visit  Medication Sig Dispense Refill   amLODipine (NORVASC) 2.5 MG tablet Take 1 tablet (2.5 mg total) by mouth daily. 90 tablet 3   aspirin EC 81 MG tablet Take 81 mg by mouth at bedtime.     atorvastatin (LIPITOR) 20 MG tablet Take 1 tablet (20 mg total) by mouth at bedtime. 90 tablet 3   Calcium Carb-Cholecalciferol (OS-CAL CALCIUM + D3 PO) Take 1 tablet by mouth daily. Calcium 1200 mg with Vitamin D     cholecalciferol (VITAMIN D) 1000 units tablet Take 1,000 Units by mouth daily.     diclofenac Sodium (VOLTAREN) 1 % GEL Apply 2 g topically 4 (four) times daily. 50 g 1   furosemide (LASIX) 20 MG tablet Take 1 tablet (20 mg total) by mouth daily. 90 tablet 3   Garlic 10 MG CAPS Take by mouth. One a day - otc     Omega-3 Fatty Acids (FISH OIL) 1200 MG CAPS Take 1 capsule by mouth every morning.     Psyllium (METAMUCIL PO) Take one (1) teaspoon by mouth daily at bedtime.     pyridoxine (B-6) 200 MG tablet Take 200 mg daily by mouth.     vitamin B-12 (CYANOCOBALAMIN) 1000 MCG tablet Take 1,000 mcg by mouth daily.     amoxicillin-clavulanate (AUGMENTIN) 875-125 MG tablet Take 1 tablet by mouth 2 (two) times daily. 20 tablet 0   No facility-administered medications prior to visit.    Allergies  Allergen Reactions   Sulfa Antibiotics Swelling    Angoedema   Demerol Nausea And Vomiting   Lisinopril Swelling    angioedema . Uncertain if due to Sulfa or from ACE-I but ACE-I was discontinued to be safe.   Moviprep [Peg-Kcl-Nacl-Nasulf-Na Asc-C] Nausea And Vomiting   Declomycin [Demeclocycline] Rash    Review of Systems  Constitutional: Negative.  Negative for fatigue and fever.  HENT:  Negative for congestion and sore throat.   Respiratory:  Negative for cough.   Gastrointestinal:  Negative for anorexia.  Genitourinary: Negative.    Skin:  Positive for color change and rash.      Objective:    Physical Exam Vitals and nursing note reviewed.  Constitutional:      Appearance: Normal appearance.  HENT:     Head: Normocephalic.     Nose: Nose normal.     Mouth/Throat:     Mouth: Mucous membranes are moist.  Eyes:     Conjunctiva/sclera: Conjunctivae normal.  Cardiovascular:  Pulses: Normal pulses.     Heart sounds: Normal heart sounds.  Pulmonary:     Effort: Pulmonary effort is normal.     Breath sounds: Normal breath sounds.  Abdominal:     General: Bowel sounds are normal.  Skin:    Findings: Erythema and rash present.          Comments: Tick bite on left lower front leg, rash from insect bite on left lower back leg  Neurological:     Mental Status: She is alert and oriented to person, place, and time.    BP 138/65   Pulse 74   Temp 97.9 F (36.6 C) (Temporal)   Wt 140 lb (63.5 kg)   SpO2 96%   BMI 26.45 kg/m  Wt Readings from Last 3 Encounters:  06/09/21 140 lb (63.5 kg)  03/31/21 140 lb (63.5 kg)  11/03/20 144 lb 6.4 oz (65.5 kg)    Health Maintenance Due  Topic Date Due   COVID-19 Vaccine (1) Never done   TETANUS/TDAP  Never done   Zoster Vaccines- Shingrix (1 of 2) Never done    There are no preventive care reminders to display for this patient.   No results found for: TSH Lab Results  Component Value Date   WBC 7.9 03/18/2020   HGB 13.1 03/18/2020   HCT 38.2 03/18/2020   MCV 89 03/18/2020   PLT 223 03/18/2020   Lab Results  Component Value Date   NA 142 03/18/2020   K 4.1 03/18/2020   CO2 28 03/18/2020   GLUCOSE 135 (H) 03/18/2020   BUN 14 03/18/2020   CREATININE 0.85 03/18/2020   BILITOT 0.3 03/18/2020   ALKPHOS 64 03/18/2020   AST 31 03/18/2020   ALT 29 03/18/2020   PROT 6.3 03/18/2020   ALBUMIN 4.4 03/18/2020   CALCIUM 9.1 03/18/2020   Lab Results  Component Value Date   CHOL 172 03/18/2020   Lab Results  Component Value Date   HDL 65 03/18/2020    Lab Results  Component Value Date   LDLCALC 86 03/18/2020   Lab Results  Component Value Date   TRIG 122 03/18/2020   Lab Results  Component Value Date   CHOLHDL 2.6 03/18/2020   Lab Results  Component Value Date   HGBA1C 6.0 05/04/2020       Assessment & Plan:   Problem List Items Addressed This Visit       Musculoskeletal and Integument   Tick bite of left lower leg - Primary    Tick bite left lower leg, tick according to patient was very small. Removed less than 24 hours, area around skin is red from manipulation of removing the tick with twizers. Advised patient to watch and wait for signs and symptoms of worsening symptoms to follow up with flu like symptoms. Redness and pain. Patient did not meet criteria for Prophylaxis.         Relevant Orders   Lyme Disease Serology w/Reflex   Rash    Rash erupted from a small insect bite about 24 hours ago, type and size of insect not specified, skin slightly red, advised patient to watch for worsening symptoms, apply cool compress, avoid hot showers and apply hydrocortisone cream .        Relevant Medications   hydrocortisone 1 % lotion     Meds ordered this encounter  Medications   hydrocortisone 1 % lotion    Sig: Apply 1 application topically 2 (two) times daily.  Dispense:  118 mL    Refill:  0    Order Specific Question:   Supervising Provider    Answer:   Janora Norlander [id number]     Ivy Lynn, NP

## 2021-06-10 NOTE — Assessment & Plan Note (Signed)
Tick bite left lower leg, tick according to patient was very small. Removed less than 24 hours, area around skin is red from manipulation of removing the tick with twizers. Advised patient to watch and wait for signs and symptoms of worsening symptoms to follow up with flu like symptoms. Redness and pain. Patient did not meet criteria for Prophylaxis.

## 2021-06-10 NOTE — Assessment & Plan Note (Signed)
Rash erupted from a small insect bite about 24 hours ago, type and size of insect not specified, skin slightly red, advised patient to watch for worsening symptoms, apply cool compress, avoid hot showers and apply hydrocortisone cream .

## 2021-06-14 ENCOUNTER — Other Ambulatory Visit: Payer: Self-pay | Admitting: Family Medicine

## 2021-06-21 ENCOUNTER — Ambulatory Visit: Payer: Medicare Other | Admitting: Family Medicine

## 2021-06-22 ENCOUNTER — Other Ambulatory Visit: Payer: Self-pay

## 2021-06-22 ENCOUNTER — Ambulatory Visit: Payer: Medicare Other | Admitting: Family Medicine

## 2021-06-22 ENCOUNTER — Encounter: Payer: Self-pay | Admitting: Family Medicine

## 2021-06-22 VITALS — BP 128/71 | HR 77 | Temp 97.6°F | Ht 61.0 in | Wt 139.8 lb

## 2021-06-22 DIAGNOSIS — R7303 Prediabetes: Secondary | ICD-10-CM | POA: Diagnosis not present

## 2021-06-22 DIAGNOSIS — N3281 Overactive bladder: Secondary | ICD-10-CM

## 2021-06-22 DIAGNOSIS — E7800 Pure hypercholesterolemia, unspecified: Secondary | ICD-10-CM | POA: Diagnosis not present

## 2021-06-22 DIAGNOSIS — W57XXXD Bitten or stung by nonvenomous insect and other nonvenomous arthropods, subsequent encounter: Secondary | ICD-10-CM

## 2021-06-22 DIAGNOSIS — I10 Essential (primary) hypertension: Secondary | ICD-10-CM | POA: Diagnosis not present

## 2021-06-22 DIAGNOSIS — S80862D Insect bite (nonvenomous), left lower leg, subsequent encounter: Secondary | ICD-10-CM

## 2021-06-22 DIAGNOSIS — S46311A Strain of muscle, fascia and tendon of triceps, right arm, initial encounter: Secondary | ICD-10-CM

## 2021-06-22 LAB — BAYER DCA HB A1C WAIVED: HB A1C (BAYER DCA - WAIVED): 5.7 % (ref <7.0)

## 2021-06-22 MED ORDER — ATORVASTATIN CALCIUM 20 MG PO TABS: 20.0000 mg | ORAL_TABLET | Freq: Every day | ORAL | 3 refills | Status: DC

## 2021-06-22 MED ORDER — FUROSEMIDE 20 MG PO TABS: 20.0000 mg | ORAL_TABLET | Freq: Every day | ORAL | 3 refills | Status: DC

## 2021-06-22 MED ORDER — AMLODIPINE BESYLATE 2.5 MG PO TABS: 2.5000 mg | ORAL_TABLET | Freq: Every day | ORAL | 3 refills | Status: DC

## 2021-06-22 MED ORDER — MIRABEGRON ER 25 MG PO TB24: 25.0000 mg | ORAL_TABLET | Freq: Every day | ORAL | 0 refills | Status: DC

## 2021-06-22 NOTE — Patient Instructions (Signed)
Trial of Myrbetriq for overactive bladder.

## 2021-06-22 NOTE — Progress Notes (Signed)
Subjective: CC: Hypertension, hyperlipidemia PCP: Janora Norlander, DO TIW:[id number] A Ashley [name] is a 75 y.o. female presenting to clinic today for:  1.  Hypertension with hyperlipidemia Patient is compliant with Norvasc 2.5 mg daily, Lipitor 20 mg daily and Lasix 20 mg daily.  No chest pain, shortness of breath or edema outside of her normal right ankle edema.  She does admit to overactive bladder and frequent urinary continence.  Like to try something for this.  2.  Right tricep pain Patient reports that she extended her right upper extremity behind her this morning and got a sharp pain.  This is something that is happened in the past.  No reports of weakness or sensory changes.  3.  Tick bite Patient sustained a tick bite to the left anterior shin and is that she still has a scab formation there.  She applied some alcohol and peroxide when it looked a little red but otherwise it seems to be slowly healing.   ROS: Per HPI  Allergies  Allergen Reactions   Sulfa Antibiotics Swelling    Angoedema   Demerol Nausea And Vomiting   Lisinopril Swelling    angioedema . Uncertain if due to Sulfa or from ACE-I but ACE-I was discontinued to be safe.   Moviprep [Peg-Kcl-Nacl-Nasulf-Na Asc-C] Nausea And Vomiting   Declomycin [Demeclocycline] Rash   Past Medical History:  Diagnosis Date   Coronary artery disease 01/08/10   STATUS POST STENTING OF THE LAD AND THE FIRST OBTUSE MARGINAL VESSEL   Diverticulitis    Headache(784.0)    Heart disease    Herpes zoster    Hypercholesterolemia    Hyperlipidemia    Phlebitis Right   Leg   PONV (postoperative nausea and vomiting)    Stomach ulcer     Current Outpatient Medications:    amLODipine (NORVASC) 2.5 MG tablet, Take 1 tablet by mouth daily., Disp: 90 tablet, Rfl: 0   aspirin EC 81 MG tablet, Take 81 mg by mouth at bedtime., Disp: , Rfl:    atorvastatin (LIPITOR) 20 MG tablet, Take 1 tablet (20 mg total) by mouth at bedtime., Disp: 90  tablet, Rfl: 3   Calcium Carb-Cholecalciferol (OS-CAL CALCIUM + D3 PO), Take 1 tablet by mouth daily. Calcium 1200 mg with Vitamin D, Disp: , Rfl:    cholecalciferol (VITAMIN D) 1000 units tablet, Take 1,000 Units by mouth daily., Disp: , Rfl:    diclofenac Sodium (VOLTAREN) 1 % GEL, Apply 2 g topically 4 (four) times daily., Disp: 50 g, Rfl: 1   furosemide (LASIX) 20 MG tablet, Take 1 tablet (20 mg total) by mouth daily., Disp: 90 tablet, Rfl: 3   Garlic 10 MG CAPS, Take by mouth. One a day - otc, Disp: , Rfl:    hydrocortisone 1 % lotion, Apply 1 application topically 2 (two) times daily., Disp: 118 mL, Rfl: 0   Omega-3 Fatty Acids (FISH OIL) 1200 MG CAPS, Take 1 capsule by mouth every morning., Disp: , Rfl:    Psyllium (METAMUCIL PO), Take one (1) teaspoon by mouth daily at bedtime., Disp: , Rfl:    pyridoxine (B-6) 200 MG tablet, Take 200 mg daily by mouth., Disp: , Rfl:    vitamin B-12 (CYANOCOBALAMIN) 1000 MCG tablet, Take 1,000 mcg by mouth daily., Disp: , Rfl:  Social History   Socioeconomic History   Marital status: Married    Spouse name: Berneta Sages   Number of children: 5   Years of education: GED   Highest education level:  GED or equivalent  Occupational History   Occupation: retired  Tobacco Use   Smoking status: Never   Smokeless tobacco: Never  Vaping Use   Vaping Use: Never used  Substance and Sexual Activity   Alcohol use: Yes    Comment: Socially-wine   Drug use: No   Sexual activity: Yes    Birth control/protection: Surgical  Other Topics Concern   Not on file  Social History Narrative   ** Merged History Encounter **       Social Determinants of Health   Financial Resource Strain: Low Risk    Difficulty of Paying Living Expenses: Not hard at all  Food Insecurity: No Food Insecurity   Worried About Charity fundraiser in the Last Year: Never true   [place] in the Last Year: Never true  Transportation Needs: No Transportation Needs   Lack of  Transportation (Medical): No   Lack of Transportation (Non-Medical): No  Physical Activity: Sufficiently Active   Days of Exercise per Week: 6 days   Minutes of Exercise per Session: 30 min  Stress: No Stress Concern Present   Feeling of Stress : Not at all  Social Connections: Socially Integrated   Frequency of Communication with Friends and Family: More than three times a week   Frequency of Social Gatherings with Friends and Family: More than three times a week   Attends Religious Services: More than 4 times per year   Active Member of [employer] or Organizations: Yes   Attends Music therapist: More than 4 times per year   Marital Status: Married  Human resources officer Violence: Not At Risk   Fear of Current or Ex-Partner: No   Emotionally Abused: No   Physically Abused: No   Sexually Abused: No   Family History  Problem Relation Age of Onset   Emphysema Father    Colon cancer Father    Lupus Child    Heart disease Sister    Heart disease Paternal Grandmother    Hypertension Paternal Grandmother    Heart disease Maternal Grandmother    Hypertension Maternal Grandmother     Objective: Office vital signs reviewed. BP 128/71   Pulse 77   Temp 97.6 F (36.4 C)   Ht _0  (1.549 m)   Wt 139 lb 12.8 oz (63.4 kg)   SpO2 99%   BMI 26.41 kg/m   Physical Examination:  General: Awake, alert, well nourished, No acute distress Cardio: regular rate and rhythm, S1S2 heard, no murmurs appreciated Pulm: clear to auscultation bilaterally, no wheezes, rhonchi or rales; normal work of breathing on room air Extremities: warm, well perfused, edema noted in the right ankle.  No edema in the left lower extremity. Skin: Healing insect bite noted along the left anterior shin with no evidence of secondary infection MSK: Has full painless active range of motion.  Strength testing 5/5 throughout.  Negative Hawkins  Assessment/ Plan: 75 y.o. female   Essential hypertension - Plan:  CMP14+EGFR, Lipid Panel  Hypercholesterolemia - Plan: CMP14+EGFR, Lipid Panel  Pre-diabetes - Plan: Bayer DCA Hb A1c Waived  Overactive bladder - Plan: mirabegron ER (MYRBETRIQ) 25 MG TB24 tablet  Tick bite of left lower leg, subsequent encounter  Strain of right triceps, initial encounter  Blood pressure at goal.  Continue current regimen.  Check nonfasting labs  Continue statin.  Check A1c given A1c of 6.0 last year  Trial of Myrbetriq for overactive bladder  Tick bite seems to be healing  without secondary concerns  Suspect strain of the right triceps that she had a totally normal exam today   No orders of the defined types were placed in this encounter.  No orders of the defined types were placed in this encounter.    Janora Norlander, DO [place] [phone]

## 2021-06-23 LAB — CMP14+EGFR
ALT: 24 IU/L (ref 0–32)
AST: 29 IU/L (ref 0–40)
Albumin/Globulin Ratio: 2.3 — ABNORMAL HIGH (ref 1.2–2.2)
Albumin: 4.4 g/dL (ref 3.7–4.7)
Alkaline Phosphatase: 60 IU/L (ref 44–121)
BUN/Creatinine Ratio: 19 (ref 12–28)
BUN: 15 mg/dL (ref 8–27)
Bilirubin Total: 0.4 mg/dL (ref 0.0–1.2)
CO2: 25 mmol/L (ref 20–29)
Calcium: 9.2 mg/dL (ref 8.7–10.3)
Chloride: 104 mmol/L (ref 96–106)
Creatinine, Ser: 0.8 mg/dL (ref 0.57–1.00)
Globulin, Total: 1.9 g/dL (ref 1.5–4.5)
Glucose: 90 mg/dL (ref 65–99)
Potassium: 4.2 mmol/L (ref 3.5–5.2)
Sodium: 139 mmol/L (ref 134–144)
Total Protein: 6.3 g/dL (ref 6.0–8.5)
eGFR: 76 mL/min/{1.73_m2} (ref >59)

## 2021-06-23 LAB — LIPID PANEL
Chol/HDL Ratio: 2.3 ratio (ref 0.0–4.4)
Cholesterol, Total: 144 mg/dL (ref 100–199)
HDL: 63 mg/dL (ref >39)
LDL Chol Calc (NIH): 70 mg/dL (ref 0–99)
Triglycerides: 47 mg/dL (ref 0–149)
VLDL Cholesterol Cal: 11 mg/dL (ref 5–40)

## 2021-07-01 ENCOUNTER — Other Ambulatory Visit: Payer: Self-pay

## 2021-07-01 ENCOUNTER — Ambulatory Visit (HOSPITAL_COMMUNITY)
Admission: RE | Admit: 2021-07-01 | Discharge: 2021-07-01 | Disposition: A | Discharge status: Home / Self Care | Payer: Medicare Other | Source: Ambulatory Visit | Attending: Family Medicine | Admitting: Family Medicine

## 2021-07-01 DIAGNOSIS — Z1231 Encounter for screening mammogram for malignant neoplasm of breast: Secondary | ICD-10-CM | POA: Diagnosis present

## 2021-07-14 ENCOUNTER — Ambulatory Visit: Payer: Medicare Other | Admitting: Family Medicine

## 2021-09-28 ENCOUNTER — Encounter: Payer: Self-pay | Admitting: *Deleted

## 2021-11-01 ENCOUNTER — Encounter: Payer: Self-pay | Admitting: Family Medicine

## 2021-11-01 ENCOUNTER — Ambulatory Visit: Payer: Medicare Other | Admitting: Family Medicine

## 2021-11-01 VITALS — BP 130/50 | HR 69 | Temp 98.1°F | Ht 61.0 in | Wt 142.8 lb

## 2021-11-01 DIAGNOSIS — J329 Chronic sinusitis, unspecified: Secondary | ICD-10-CM

## 2021-11-01 DIAGNOSIS — H66002 Acute suppurative otitis media without spontaneous rupture of ear drum, left ear: Secondary | ICD-10-CM | POA: Diagnosis not present

## 2021-11-01 DIAGNOSIS — J40 Bronchitis, not specified as acute or chronic: Secondary | ICD-10-CM

## 2021-11-01 MED ORDER — AMOXICILLIN-POT CLAVULANATE 875-125 MG PO TABS
1.0000 | ORAL_TABLET | Freq: Two times a day (BID) | ORAL | 0 refills | Status: DC
Start: 2021-11-01 — End: 2021-11-17

## 2021-11-01 MED ORDER — BENZONATATE 200 MG PO CAPS
200.0000 mg | ORAL_CAPSULE | Freq: Three times a day (TID) | ORAL | 0 refills | Status: DC | PRN
Start: 2021-11-01 — End: 2021-11-17

## 2021-11-01 NOTE — Progress Notes (Signed)
Chief Complaint  Patient presents with   Otalgia   Cough    HPI  Patient presents today for Patient presents with upper respiratory congestion. There is moderate sore throat. Patient reports coughing frequently as well.  YELLOW sputum noted. There is no fever, chills or sweats. The patient denies being short of breath. Onset was 3-5 days ago. Gradually worsening. Tried OTCs without improvement.  PMH: Smoking status noted ROS: Per HPI  Objective: BP (!) 130/50    Pulse 69    Temp 98.1 F (36.7 C)    Ht 5\' 1"  (1.549 m)    Wt 142 lb 12.8 oz (64.8 kg)    SpO2 100%    BMI 26.98 kg/m  Gen: NAD, alert, cooperative with exam HEENT: NCAT, Nasal passages swollen, red at Left tm CV: RRR, good S1/S2, no murmur Resp: Bronchitis changes with scattered wheezes, non-labored Neuro: Alert and oriented, No gross deficits  Assessment and plan:  1. Sinobronchitis   2. Acute suppurative otitis media of left ear without spontaneous rupture of tympanic membrane, recurrence not specified     Meds ordered this encounter  Medications   amoxicillin-clavulanate (AUGMENTIN) 875-125 MG tablet    Sig: Take 1 tablet by mouth 2 (two) times daily. Take all of this medication    Dispense:  20 tablet    Refill:  0   benzonatate (TESSALON) 200 MG capsule    Sig: Take 1 capsule (200 mg total) by mouth 3 (three) times daily as needed for cough.    Dispense:  20 capsule    Refill:  0    No orders of the defined types were placed in this encounter.   Follow up as needed.  Claretta Fraise, MD

## 2021-11-09 ENCOUNTER — Ambulatory Visit (INDEPENDENT_AMBULATORY_CARE_PROVIDER_SITE_OTHER): Payer: Medicare Other

## 2021-11-09 VITALS — Ht 61.0 in | Wt 142.0 lb

## 2021-11-09 DIAGNOSIS — Z0000 Encounter for general adult medical examination without abnormal findings: Secondary | ICD-10-CM

## 2021-11-09 DIAGNOSIS — Z780 Asymptomatic menopausal state: Secondary | ICD-10-CM

## 2021-11-09 NOTE — Progress Notes (Signed)
Subjective:   Ashley [name] is a 75 y.o. female who presents for Medicare Annual (Subsequent) preventive examination.  Virtual Visit via Telephone Note  I connected with  Ashley [name] on 11/09/21 at  2:00 PM EST by telephone and verified that I am speaking with the correct person using two identifiers.  Location: Patient: Home Provider: [facility] Persons participating in the virtual visit: patient/Nurse Health Advisor   I discussed the limitations, risks, security and privacy concerns of performing an evaluation and management service by telephone and the availability of in person appointments. The patient expressed understanding and agreed to proceed.  Interactive audio and video telecommunications were attempted between this nurse and patient, however failed, due to patient having technical difficulties OR patient did not have access to video capability.  We continued and completed visit with audio only.  Some vital signs may be absent or patient reported.   Yuna Pizzolato E Geanine Vandekamp, LPN   Review of Systems     Cardiac Risk Factors include: advanced age (>30men, >28 women);hypertension;Other (see comment), Risk factor comments: CAD     Objective:    Today's Vitals   11/09/21 1401  Weight: 142 lb (64.4 kg)  Height: 5\' 1"  (1.549 m)   Body mass index is 26.83 kg/m.  Advanced Directives 11/09/2021 11/04/2020 06/04/2019 09/21/2016  Does Patient Have a Medical Advance Directive? Yes Yes Yes No  Type of Paramedic of [place];Living will [place];Living will Living will -  Does patient want to make changes to medical advance directive? - No - Patient declined No - Patient declined -  Copy of [place] in Chart? No - copy requested No - copy requested - -  Would patient like information on creating a medical advance directive? - - - No - patient declined information    Current Medications (verified) Outpatient Encounter  Medications as of 11/09/2021  Medication Sig   amLODipine (NORVASC) 2.5 MG tablet Take 1 tablet (2.5 mg total) by mouth daily.   amoxicillin-clavulanate (AUGMENTIN) 875-125 MG tablet Take 1 tablet by mouth 2 (two) times daily. Take all of this medication   aspirin EC 81 MG tablet Take 81 mg by mouth at bedtime.   atorvastatin (LIPITOR) 20 MG tablet Take 1 tablet (20 mg total) by mouth at bedtime.   benzonatate (TESSALON) 200 MG capsule Take 1 capsule (200 mg total) by mouth 3 (three) times daily as needed for cough.   Calcium Carb-Cholecalciferol (OS-CAL CALCIUM + D3 PO) Take 1 tablet by mouth daily. Calcium 1200 mg with Vitamin D   cholecalciferol (VITAMIN D) 1000 units tablet Take 1,000 Units by mouth daily.   diclofenac Sodium (VOLTAREN) 1 % GEL Apply 2 g topically 4 (four) times daily.   furosemide (LASIX) 20 MG tablet Take 1 tablet (20 mg total) by mouth daily.   Garlic 10 MG CAPS Take by mouth. One a day - otc   hydrocortisone 1 % lotion Apply 1 application topically 2 (two) times daily.   mirabegron ER (MYRBETRIQ) 25 MG TB24 tablet Take 1 tablet (25 mg total) by mouth daily.   Omega-3 Fatty Acids (FISH OIL) 1200 MG CAPS Take 1 capsule by mouth every morning.   Psyllium (METAMUCIL PO) Take one (1) teaspoon by mouth daily at bedtime.   pyridoxine (B-6) 200 MG tablet Take 200 mg daily by mouth.   vitamin B-12 (CYANOCOBALAMIN) 1000 MCG tablet Take 1,000 mcg by mouth daily.   No facility-administered encounter medications on  file as of 11/09/2021.    Allergies (verified) Sulfa antibiotics, Demerol, Lisinopril, Moviprep [peg-kcl-nacl-nasulf-na asc-c], and Declomycin [demeclocycline]   History: Past Medical History:  Diagnosis Date   Coronary artery disease 01/08/10   STATUS POST STENTING OF THE LAD AND THE FIRST OBTUSE MARGINAL VESSEL   Diverticulitis    Headache(784.0)    Heart disease    Herpes zoster    Hypercholesterolemia    Hyperlipidemia    Phlebitis Right   Leg   PONV  (postoperative nausea and vomiting)    Stomach ulcer    Past Surgical History:  Procedure Laterality Date   ABDOMINAL HYSTERECTOMY     with bladder tack   BREAST BIOPSY Left    Normal   Bunionectomy Right    CARDIAC CATHETERIZATION  01/08/10   CARDIAC CATHETERIZATION  01/05/10   NORMAL LEFT VENTRICULAR SIZE AND NORMAL SYSTOLIC FUNCTION. EF IS 60%.    CATARACT EXTRACTION     COLONOSCOPY  07/13/2011   RMR:Lax anal sphincter tone otherwise normal/left sided diverticula, polyp removed but not analyzed   COLONOSCOPY N/A 09/21/2016   Procedure: COLONOSCOPY;  Surgeon: Daneil Dolin, MD;  Location: [facility];  Service: Endoscopy;  Laterality: N/A;  1:00 pm   CORONARY STENT PLACEMENT     VAGINAL HYSTERECTOMY     VEIN LIGATION     Family History  Problem Relation Age of Onset   Emphysema Father    Colon cancer Father    Lupus Child    Heart disease Sister    Heart disease Paternal Grandmother    Hypertension Paternal Grandmother    Heart disease Maternal Grandmother    Hypertension Maternal Grandmother    Social History   Socioeconomic History   Marital status: Married    Spouse name: Berneta Sages   Number of children: 5   Years of education: GED   Highest education level: GED or equivalent  Occupational History   Occupation: retired  Tobacco Use   Smoking status: Never   Smokeless tobacco: Never  Vaping Use   Vaping Use: Never used  Substance and Sexual Activity   Alcohol use: Yes    Comment: Socially-wine   Drug use: No   Sexual activity: Yes    Birth control/protection: Surgical  Other Topics Concern   Not on file  Social History Narrative   One level home with her husband    Husband has dementia and she is sole caregiver   Daughter lives next door   Enjoys line-dancing 1-2x per week and playing pickle ball at [employer]   Social Determinants of Health   Financial Resource Strain: Low Risk    Difficulty of Paying Living Expenses: Not hard at all  Food Insecurity:  No Food Insecurity   Worried About Charity fundraiser in the Last Year: Never true   Arboriculturist in the Last Year: Never true  Transportation Needs: No Transportation Needs   Lack of Transportation (Medical): No   Lack of Transportation (Non-Medical): No  Physical Activity: Sufficiently Active   Days of Exercise per Week: 4 days   Minutes of Exercise per Session: 60 min  Stress: No Stress Concern Present   Feeling of Stress : Not at all  Social Connections: Socially Integrated   Frequency of Communication with Friends and Family: More than three times a week   Frequency of Social Gatherings with Friends and Family: More than three times a week   Attends Religious Services: More than 4 times per  year   Active Member of Clubs or Organizations: Yes   Attends Music therapist: More than 4 times per year   Marital Status: Married    Tobacco Counseling Counseling given: Not Answered   Clinical Intake:  Pre-visit preparation completed: Yes  Pain : No/denies pain     BMI - recorded: 26.83 Nutritional Status: BMI 25 -29 Overweight Nutritional Risks: None Diabetes: No  How often do you need to have someone help you when you read instructions, pamphlets, or other written materials from your doctor or pharmacy?: 1 - Never  Diabetic? no  Interpreter Needed?: No  Information entered by :: Keaton Beichner, LPN   Activities of Daily Living In your present state of health, do you have any difficulty performing the following activities: 11/09/2021  Hearing? N  Vision? N  Difficulty concentrating or making decisions? N  Walking or climbing stairs? N  Dressing or bathing? N  Doing errands, shopping? N  Preparing Food and eating ? N  Using the Toilet? N  In the past six months, have you accidently leaked urine? Y  Comment wears pads for protection  Do you have problems with loss of bowel control? Y  Comment has to wear a pad - has a hard time making it stop   Managing your Medications? N  Managing your Finances? N  Housekeeping or managing your Housekeeping? N  Some recent data might be hidden    Patient Care Team: Janora Norlander, DO as PCP - General (Family Medicine) Gala Romney Cristopher Estimable, MD (Gastroenterology) Pcp, No  Indicate any recent Medical Services you may have received from other than [facility] providers in the past year (date may be approximate).     Assessment:   This is a routine wellness examination for Ashley [name].  Hearing/Vision screen Hearing Screening - Comments:: Denies hearing difficulties  Vision Screening - Comments:: No vision difficulties - behind on annual eye exams with [facility]   Dietary issues and exercise activities discussed: Current Exercise Habits: Home exercise routine, Type of exercise: walking;calisthenics;stretching, Time (Minutes): 60, Frequency (Times/Week): 4, Weekly Exercise (Minutes/Week): 240, Intensity: Moderate, Exercise limited by: None identified   Goals Addressed             This Visit's Progress    DIET - INCREASE WATER INTAKE   Not on track    Try to drink 6-8 glasses of water daily.     Exercise 3x per week (30 min per time)   On track    11/04/2020 AWV Goal: Exercise for General Health  Patient will verbalize understanding of the benefits of increased physical activity: Exercising regularly is important. It will improve your overall fitness, flexibility, and endurance. Regular exercise also will improve your overall health. It can help you control your weight, reduce stress, and improve your bone density. Over the next year, patient will increase physical activity as tolerated with a goal of at least 150 minutes of moderate physical activity per week.  You can tell that you are exercising at a moderate intensity if your heart starts beating faster and you start breathing faster but can still hold a conversation. Moderate-intensity exercise ideas include: Walking 1 mile (1.6 km) in about  15 minutes Biking Hiking Golfing Dancing Water aerobics Patient will verbalize understanding of everyday activities that increase physical activity by providing examples like the following: Yard work, such as: Quarry manager windows or floors Patient  will be able to explain general safety guidelines for exercising:  Before you start a new exercise program, talk with your health care provider. Do not exercise so much that you hurt yourself, feel dizzy, or get very short of breath. Wear comfortable clothes and wear shoes with good support. Drink plenty of water while you exercise to prevent dehydration or heat stroke. Work out until your breathing and your heartbeat get faster.        Depression Screen PHQ 2/9 Scores 11/09/2021 11/01/2021 06/22/2021 06/09/2021 03/31/2021 11/04/2020 11/04/2020  PHQ - 2 Score 0 0 0 0 0 0 0  PHQ- 9 Score - - - - - - -    Fall Risk Fall Risk  11/09/2021 11/01/2021 06/22/2021 03/31/2021 11/04/2020  Falls in the past year? 0 0 0 0 0  Number falls in past yr: 0 - - - 0  Injury with Fall? 0 - - - 0  Risk for fall due to : No Fall Risks - - - No Fall Risks  Follow up Falls prevention discussed - - - Falls evaluation completed    FALL RISK PREVENTION PERTAINING TO THE HOME:  Any stairs in or around the home? Yes  If so, are there any without handrails? No  Home free of loose throw rugs in walkways, pet beds, electrical cords, etc? Yes  Adequate lighting in your home to reduce risk of falls? Yes   ASSISTIVE DEVICES UTILIZED TO PREVENT FALLS:  Life alert? No  Use of a cane, walker or w/c? No  Grab bars in the bathroom? Yes  Shower chair or bench in shower? No  Elevated toilet seat or a handicapped toilet? No   TIMED UP AND GO:  Was the test performed? No . Telephonic visit  Cognitive Function: Normal cognitive status assessed by direct  observation by this Nurse Health Advisor. No abnormalities found.       6CIT Screen 11/04/2020 06/04/2019  What Year? 0 points 0 points  What month? 0 points 0 points  What time? 0 points 0 points  Count back from 20 0 points 0 points  Months in reverse 0 points 0 points  Repeat phrase 0 points 4 points  Total Score 0 4    Immunizations Immunization History  Administered Date(s) Administered   Fluad Quad(high Dose 65+) 07/24/2020   Influenza-Unspecified 08/16/2018, 08/08/2021   Pneumococcal Conjugate-13 05/04/2020   Pneumococcal Polysaccharide-23 08/18/2016    TDAP status: Due, Education has been provided regarding the importance of this vaccine. Advised may receive this vaccine at local pharmacy or Health Dept. Aware to provide a copy of the vaccination record if obtained from local pharmacy or Health Dept. Verbalized acceptance and understanding.  Flu Vaccine status: Up to date  Pneumococcal vaccine status: Up to date  Covid-19 vaccine status: Declined, Education has been provided regarding the importance of this vaccine but patient still declined. Advised may receive this vaccine at local pharmacy or Health Dept.or vaccine clinic. Aware to provide a copy of the vaccination record if obtained from local pharmacy or Health Dept. Verbalized acceptance and understanding.  Qualifies for Shingles Vaccine? Yes   Zostavax completed Yes   Shingrix Completed?: No.    Education has been provided regarding the importance of this vaccine. Patient has been advised to call insurance company to determine out of pocket expense if they have not yet received this vaccine. Advised may also receive vaccine at local pharmacy or Health Dept. Verbalized acceptance and understanding.  Screening Tests Health Maintenance  Topic Date Due   COVID-19 Vaccine (1) 11/17/2021 (Originally 04/30/1947)   Zoster Vaccines- Shingrix (1 of 2) 01/30/2022 (Originally 10/31/1965)   TETANUS/TDAP  06/22/2022 (Originally  10/31/1965)   Pneumonia Vaccine 48+ Years old  Completed   INFLUENZA VACCINE  Completed   DEXA SCAN  Completed   Hepatitis C Screening  Completed   HPV VACCINES  Aged Out   COLONOSCOPY (Pts 45-46yrs Insurance coverage will need to be confirmed)  Discontinued    Health Maintenance  There are no preventive care reminders to display for this patient.  Colorectal cancer screening: No longer required.   Mammogram status: Completed 07/01/2021. Repeat every year  Bone Density status: Ordered 11/09/2021. Pt provided with contact info and advised to call to schedule appt.  Lung Cancer Screening: (Low Dose CT Chest recommended if Age 23-80 years, 30 pack-year currently smoking OR have quit w/in 15years.) does not qualify.  Additional Screening:  Hepatitis C Screening: does qualify; Completed 12/13/2018  Vision Screening: Recommended annual ophthalmology exams for early detection of glaucoma and other disorders of the eye. Is the patient up to date with their annual eye exam?  No  Who is the provider or what is the name of the office in which the patient attends annual eye exams? [facility] If pt is not established with a provider, would they like to be referred to a provider to establish care? No .   Dental Screening: Recommended annual dental exams for proper oral hygiene  Community Resource Referral / Chronic Care Management: CRR required this visit?  No   CCM required this visit?  No      Plan:     I have personally reviewed and noted the following in the patients chart:   Medical and social history Use of alcohol, tobacco or illicit drugs  Current medications and supplements including opioid prescriptions.  Functional ability and status Nutritional status Physical activity Advanced directives List of other physicians Hospitalizations, surgeries, and ER visits in previous 12 months Vitals Screenings to include cognitive, depression, and falls Referrals and appointments  In  addition, I have reviewed and discussed with patient certain preventive protocols, quality metrics, and best practice recommendations. A written personalized care plan for preventive services as well as general preventive health recommendations were provided to patient.     Sandrea Hammond, LPN   [birth year]   Nurse Notes: Made routine appt for 11/17/21 - plans to get Shx, Tdap, and DEXA at this visit if covered.

## 2021-11-09 NOTE — Patient Instructions (Signed)
Ashley [name] , Thank you for taking time to come for your Medicare Wellness Visit. I appreciate your ongoing commitment to your health goals. Please review the following plan we discussed and let me know if I can assist you in the future.   Screening recommendations/referrals: Colonoscopy: Done 09/21/2016 - recommended repeat in 5 years - patient declines as the prep makes her very sick - no repeat Mammogram: Done 07/01/2021 - Repeat annually  Bone Density: Done 08/18/2015 - Repeat every 2 years - ordered - get at visit 11/17/2021 Recommended yearly ophthalmology/optometry visit for glaucoma screening and checkup Recommended yearly dental visit for hygiene and checkup  Vaccinations: Influenza vaccine: done 07/2021 - Repeat annually  Pneumococcal vaccine: Done 08/18/2016 & 05/04/2020 Tdap vaccine: Due Shingles vaccine: Due   Covid-19: Declined  Advanced directives: Please bring a copy of your health care power of attorney and living will to the office to be added to your chart at your convenience.   Conditions/risks identified: Aim for 30 minutes of exercise or brisk walking each day, drink 6-8 glasses of water and eat lots of fruits and vegetables.   Next appointment: Follow up in one year for your annual wellness visit    Preventive Care 65 Years and Older, Female Preventive care refers to lifestyle choices and visits with your health care provider that can promote health and wellness. What does preventive care include? A yearly physical exam. This is also called an annual well check. Dental exams once or twice a year. Routine eye exams. Ask your health care provider how often you should have your eyes checked. Personal lifestyle choices, including: Daily care of your teeth and gums. Regular physical activity. Eating a healthy diet. Avoiding tobacco and drug use. Limiting alcohol use. Practicing safe sex. Taking low-dose aspirin every day. Taking vitamin and mineral supplements as  recommended by your health care provider. What happens during an annual well check? The services and screenings done by your health care provider during your annual well check will depend on your age, overall health, lifestyle risk factors, and family history of disease. Counseling  Your health care provider may ask you questions about your: Alcohol use. Tobacco use. Drug use. Emotional well-being. Home and relationship well-being. Sexual activity. Eating habits. History of falls. Memory and ability to understand (cognition). Work and work Statistician. Reproductive health. Screening  You may have the following tests or measurements: Height, weight, and BMI. Blood pressure. Lipid and cholesterol levels. These may be checked every 5 years, or more frequently if you are over 12 years old. Skin check. Lung cancer screening. You may have this screening every year starting at age 64 if you have a 30-pack-year history of smoking and currently smoke or have quit within the past 15 years. Fecal occult blood test (FOBT) of the stool. You may have this test every year starting at age 23. Flexible sigmoidoscopy or colonoscopy. You may have a sigmoidoscopy every 5 years or a colonoscopy every 10 years starting at age 10. Hepatitis C blood test. Hepatitis B blood test. Sexually transmitted disease (STD) testing. Diabetes screening. This is done by checking your blood sugar (glucose) after you have not eaten for a while (fasting). You may have this done every 1-3 years. Bone density scan. This is done to screen for osteoporosis. You may have this done starting at age 71. Mammogram. This may be done every 1-2 years. Talk to your health care provider about how often you should have regular mammograms. Talk with your health  care provider about your test results, treatment options, and if necessary, the need for more tests. Vaccines  Your health care provider may recommend certain vaccines, such  as: Influenza vaccine. This is recommended every year. Tetanus, diphtheria, and acellular pertussis (Tdap, Td) vaccine. You may need a Td booster every 10 years. Zoster vaccine. You may need this after age 49. Pneumococcal 13-valent conjugate (PCV13) vaccine. One dose is recommended after age 24. Pneumococcal polysaccharide (PPSV23) vaccine. One dose is recommended after age 65. Talk to your health care provider about which screenings and vaccines you need and how often you need them. This information is not intended to replace advice given to you by your health care provider. Make sure you discuss any questions you have with your health care provider. Document Released: 11/27/2015 Document Revised: 07/20/2016 Document Reviewed: 09/01/2015 Elsevier Interactive Patient Education  2017 [place] Prevention in the Home Falls can cause injuries. They can happen to people of all ages. There are many things you can do to make your home safe and to help prevent falls. What can I do on the outside of my home? Regularly fix the edges of walkways and driveways and fix any cracks. Remove anything that might make you trip as you walk through a door, such as a raised step or threshold. Trim any bushes or trees on the path to your home. Use bright outdoor lighting. Clear any walking paths of anything that might make someone trip, such as rocks or tools. Regularly check to see if handrails are loose or broken. Make sure that both sides of any steps have handrails. Any raised decks and porches should have guardrails on the edges. Have any leaves, snow, or ice cleared regularly. Use sand or salt on walking paths during winter. Clean up any spills in your garage right away. This includes oil or grease spills. What can I do in the bathroom? Use night lights. Install grab bars by the toilet and in the tub and shower. Do not use towel bars as grab bars. Use non-skid mats or decals in the tub or  shower. If you need to sit down in the shower, use a plastic, non-slip stool. Keep the floor dry. Clean up any water that spills on the floor as soon as it happens. Remove soap buildup in the tub or shower regularly. Attach bath mats securely with double-sided non-slip rug tape. Do not have throw rugs and other things on the floor that can make you trip. What can I do in the bedroom? Use night lights. Make sure that you have a light by your bed that is easy to reach. Do not use any sheets or blankets that are too big for your bed. They should not hang down onto the floor. Have a firm chair that has side arms. You can use this for support while you get dressed. Do not have throw rugs and other things on the floor that can make you trip. What can I do in the kitchen? Clean up any spills right away. Avoid walking on wet floors. Keep items that you use a lot in easy-to-reach places. If you need to reach something above you, use a strong step stool that has a grab bar. Keep electrical cords out of the way. Do not use floor polish or wax that makes floors slippery. If you must use wax, use non-skid floor wax. Do not have throw rugs and other things on the floor that can make you trip. What can  I do with my stairs? Do not leave any items on the stairs. Make sure that there are handrails on both sides of the stairs and use them. Fix handrails that are broken or loose. Make sure that handrails are as long as the stairways. Check any carpeting to make sure that it is firmly attached to the stairs. Fix any carpet that is loose or worn. Avoid having throw rugs at the top or bottom of the stairs. If you do have throw rugs, attach them to the floor with carpet tape. Make sure that you have a light switch at the top of the stairs and the bottom of the stairs. If you do not have them, ask someone to add them for you. What else can I do to help prevent falls? Wear shoes that: Do not have high heels. Have  rubber bottoms. Are comfortable and fit you well. Are closed at the toe. Do not wear sandals. If you use a stepladder: Make sure that it is fully opened. Do not climb a closed stepladder. Make sure that both sides of the stepladder are locked into place. Ask someone to hold it for you, if possible. Clearly mark and make sure that you can see: Any grab bars or handrails. First and last steps. Where the edge of each step is. Use tools that help you move around (mobility aids) if they are needed. These include: Canes. Walkers. Scooters. Crutches. Turn on the lights when you go into a dark area. Replace any light bulbs as soon as they burn out. Set up your furniture so you have a clear path. Avoid moving your furniture around. If any of your floors are uneven, fix them. If there are any pets around you, be aware of where they are. Review your medicines with your doctor. Some medicines can make you feel dizzy. This can increase your chance of falling. Ask your doctor what other things that you can do to help prevent falls. This information is not intended to replace advice given to you by your health care provider. Make sure you discuss any questions you have with your health care provider. Document Released: 08/27/2009 Document Revised: 04/07/2016 Document Reviewed: 12/05/2014 Elsevier Interactive Patient Education  2017 [employer].

## 2021-11-10 ENCOUNTER — Other Ambulatory Visit: Payer: Self-pay | Admitting: Family Medicine

## 2021-11-10 DIAGNOSIS — I10 Essential (primary) hypertension: Secondary | ICD-10-CM

## 2021-11-17 ENCOUNTER — Ambulatory Visit (INDEPENDENT_AMBULATORY_CARE_PROVIDER_SITE_OTHER): Payer: Medicare HMO

## 2021-11-17 ENCOUNTER — Encounter: Payer: Self-pay | Admitting: Family Medicine

## 2021-11-17 ENCOUNTER — Ambulatory Visit: Payer: Medicare HMO | Admitting: Family Medicine

## 2021-11-17 VITALS — BP 116/61 | HR 83 | Temp 97.3°F | Ht 61.0 in | Wt 141.8 lb

## 2021-11-17 DIAGNOSIS — Z23 Encounter for immunization: Secondary | ICD-10-CM

## 2021-11-17 DIAGNOSIS — Z780 Asymptomatic menopausal state: Secondary | ICD-10-CM

## 2021-11-17 DIAGNOSIS — M1812 Unilateral primary osteoarthritis of first carpometacarpal joint, left hand: Secondary | ICD-10-CM | POA: Diagnosis not present

## 2021-11-17 DIAGNOSIS — R252 Cramp and spasm: Secondary | ICD-10-CM | POA: Diagnosis not present

## 2021-11-17 DIAGNOSIS — N3942 Incontinence without sensory awareness: Secondary | ICD-10-CM

## 2021-11-17 DIAGNOSIS — I10 Essential (primary) hypertension: Secondary | ICD-10-CM | POA: Diagnosis not present

## 2021-11-17 NOTE — Progress Notes (Signed)
Subjective: CC: Chronic follow-up PCP: Ashley Norlander, DO QTM:[id number] Ashley [name] is Ashley 76 y.o. female presenting to clinic today for:  1.  Cramping/hypertension Patient reports that she is been having intermittent cramping in various parts of her body.  She uses Lasix daily.  2.  Left thumb Patient reports that she had some swelling over the left thumb that seem to be getting better after she was on Augmentin recently.  She does have arthritis in that left thumb but is not ready to have any interventions yet  3.  Urinary incontinence Patient has been struggling with urinary incontinence for years.  She was previously evaluated by urologist.  Medication has been recommended.  She recently trialed the Myrbetriq but did not find this to be especially helpful.  She reports incontinence and sometimes she is not even aware of.  ROS: Per HPI  Allergies  Allergen Reactions   Sulfa Antibiotics Swelling    Angoedema   Demerol Nausea And Vomiting   Lisinopril Swelling    angioedema . Uncertain if due to Sulfa or from ACE-I but ACE-I was discontinued to be safe.   Moviprep [Peg-Kcl-Nacl-Nasulf-Na Asc-C] Nausea And Vomiting   Declomycin [Demeclocycline] Rash   Past Medical History:  Diagnosis Date   Coronary artery disease 01/08/10   STATUS POST STENTING OF THE LAD AND THE FIRST OBTUSE MARGINAL VESSEL   Diverticulitis    Headache(784.0)    Heart disease    Herpes zoster    Hypercholesterolemia    Hyperlipidemia    Phlebitis Right   Leg   PONV (postoperative nausea and vomiting)    Stomach ulcer     Current Outpatient Medications:    amLODipine (NORVASC) 2.5 MG tablet, Take 1 tablet (2.5 mg total) by mouth daily., Disp: 90 tablet, Rfl: 3   aspirin EC 81 MG tablet, Take 81 mg by mouth at bedtime., Disp: , Rfl:    atorvastatin (LIPITOR) 20 MG tablet, Take 1 tablet (20 mg total) by mouth at bedtime., Disp: 90 tablet, Rfl: 3   Calcium Carb-Cholecalciferol (OS-CAL CALCIUM + D3 PO), Take  1 tablet by mouth daily. Calcium 1200 mg with Vitamin D, Disp: , Rfl:    cholecalciferol (VITAMIN D) 1000 units tablet, Take 1,000 Units by mouth daily., Disp: , Rfl:    diclofenac Sodium (VOLTAREN) 1 % GEL, Apply 2 g topically 4 (four) times daily., Disp: 50 g, Rfl: 1   furosemide (LASIX) 20 MG tablet, Take 1 tablet (20 mg total) by mouth daily., Disp: 90 tablet, Rfl: 3   hydrocortisone 1 % lotion, Apply 1 application topically 2 (two) times daily., Disp: 118 mL, Rfl: 0   mirabegron ER (MYRBETRIQ) 25 MG TB24 tablet, Take 1 tablet (25 mg total) by mouth daily., Disp: 30 tablet, Rfl: 0   Omega-3 Fatty Acids (FISH OIL) 1200 MG CAPS, Take 1 capsule by mouth every morning., Disp: , Rfl:    Psyllium (METAMUCIL PO), Take one (1) teaspoon by mouth daily at bedtime., Disp: , Rfl:    pyridoxine (B-6) 200 MG tablet, Take 200 mg daily by mouth., Disp: , Rfl:    vitamin B-12 (CYANOCOBALAMIN) 1000 MCG tablet, Take 1,000 mcg by mouth daily., Disp: , Rfl:  Social History   Socioeconomic History   Marital status: Married    Spouse name: Berneta Sages   Number of children: 5   Years of education: GED   Highest education level: GED or equivalent  Occupational History   Occupation: retired  Tobacco Use  Smoking status: Never   Smokeless tobacco: Never  Vaping Use   Vaping Use: Never used  Substance and Sexual Activity   Alcohol use: Yes    Comment: Socially-wine   Drug use: No   Sexual activity: Yes    Birth control/protection: Surgical  Other Topics Concern   Not on file  Social History Narrative   One level home with her husband    Husband has dementia and she is sole caregiver   Daughter lives next door   Enjoys line-dancing 1-2x per week and playing pickle ball at [employer]   Social Determinants of Health   Financial Resource Strain: Low Risk    Difficulty of Paying Living Expenses: Not hard at all  Food Insecurity: No Food Insecurity   Worried About Charity fundraiser in the Last Year:  Never true   Arboriculturist in the Last Year: Never true  Transportation Needs: No Transportation Needs   Lack of Transportation (Medical): No   Lack of Transportation (Non-Medical): No  Physical Activity: Sufficiently Active   Days of Exercise per Week: 4 days   Minutes of Exercise per Session: 60 min  Stress: No Stress Concern Present   Feeling of Stress : Not at all  Social Connections: Socially Integrated   Frequency of Communication with Friends and Family: More than three times Ashley week   Frequency of Social Gatherings with Friends and Family: More than three times Ashley week   Attends Religious Services: More than 4 times per year   Active Member of [employer] or Organizations: Yes   Attends Music therapist: More than 4 times per year   Marital Status: Married  Human resources officer Violence: Not At Risk   Fear of Current or Ex-Partner: No   Emotionally Abused: No   Physically Abused: No   Sexually Abused: No   Family History  Problem Relation Age of Onset   Emphysema Father    Colon cancer Father    Lupus Child    Heart disease Sister    Heart disease Paternal Grandmother    Hypertension Paternal Grandmother    Heart disease Maternal Grandmother    Hypertension Maternal Grandmother     Objective: Office vital signs reviewed. BP 116/61    Pulse 83    Temp (!) 97.3 F (36.3 C)    Ht 5\' 1"  (1.549 m)    Wt 141 lb 12.8 oz (64.3 kg)    SpO2 97%    BMI 26.79 kg/m   Physical Examination:  General: Awake, alert, well nourished, No acute distress Cardio: regular rate and rhythm, S1S2 heard, no murmurs appreciated Pulm: clear to auscultation bilaterally, no wheezes, rhonchi or rales; normal work of breathing on room air Extremities: warm, well perfused, No edema, cyanosis or clubbing; +2 pulses bilaterally MSK: Ambulating independently.  Normal tone Assessment/ Plan: 76 y.o. 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  Offered referral to urology for ongoing urinary incontinence.  She is been previously evaluated for this.  She does have Ashley doctor in mind and she will contact me with that name  Offered referral to our orthopedist here in office for consideration of corticosteroid injection into that left thumb but she declines this for today  Shingles vaccination administered.  Will evaluate for electrolyte abnormalities given cramping reported.  Encourage p.o. hydration  No orders of the defined types were placed in this encounter.  No orders of the defined types were placed in this encounter.    Ashley Norlander, DO [place] [phone]

## 2021-11-18 LAB — BASIC METABOLIC PANEL
BUN/Creatinine Ratio: 14 (ref 12–28)
BUN: 13 mg/dL (ref 8–27)
CO2: 28 mmol/L (ref 20–29)
Calcium: 9.3 mg/dL (ref 8.7–10.3)
Chloride: 102 mmol/L (ref 96–106)
Creatinine, Ser: 0.95 mg/dL (ref 0.57–1.00)
Glucose: 98 mg/dL (ref 70–99)
Potassium: 4.6 mmol/L (ref 3.5–5.2)
Sodium: 141 mmol/L (ref 134–144)
eGFR: 62 mL/min/{1.73_m2} (ref >59)

## 2021-11-18 LAB — MAGNESIUM: Magnesium: 2.1 mg/dL (ref 1.6–2.3)

## 2022-01-20 ENCOUNTER — Telehealth: Payer: Self-pay | Admitting: Family Medicine

## 2022-01-20 NOTE — Telephone Encounter (Signed)
ER follow up made with Ashley [name]  ?

## 2022-01-21 ENCOUNTER — Ambulatory Visit: Payer: Medicare HMO | Admitting: Family Medicine

## 2022-01-21 ENCOUNTER — Encounter: Payer: Self-pay | Admitting: Family Medicine

## 2022-01-21 VITALS — BP 160/66 | HR 66 | Temp 98.3°F | Ht 61.0 in | Wt 142.4 lb

## 2022-01-21 DIAGNOSIS — J069 Acute upper respiratory infection, unspecified: Secondary | ICD-10-CM | POA: Diagnosis not present

## 2022-01-21 NOTE — Progress Notes (Signed)
? ?Subjective: ?CC: ER follow-up ?PCP: Janora Norlander, DO ?Ashley [name] is a 76 y.o. female presenting to clinic today for: ? ?1.  ER follow-up ?Patient was seen in the ER on 01/16/2022 for URI symptoms.  She had negative rapid flu, COVID and RSV.  She was sent home with prednisone, albuterol inhaler and Delsym and symptoms do seem to be getting better but she has been having some side effects from the prednisone.  She "feels like she is walking on air".  She has not taken this medication today.  She is feeling better today.  No high fevers.  No hemoptysis, shortness of breath or wheezing reported.  She has been having some congestion but again that seem to be getting better ? ? ?ROS: Per HPI ? ?Allergies  ?Allergen Reactions  ? Sulfa Antibiotics Swelling  ?  Angoedema  ? Demerol Nausea And Vomiting  ? Lisinopril Swelling  ?  angioedema . Uncertain if due to Sulfa or from ACE-I but ACE-I was discontinued to be safe.  ? Moviprep [Peg-Kcl-Nacl-Nasulf-Na Asc-C] Nausea And Vomiting  ? Declomycin [Demeclocycline] Rash  ? ?Past Medical History:  ?Diagnosis Date  ? Coronary artery disease 01/08/10  ? STATUS POST STENTING OF THE LAD AND THE FIRST OBTUSE MARGINAL VESSEL  ? Diverticulitis   ? Headache(784.0)   ? Heart disease   ? Herpes zoster   ? Hypercholesterolemia   ? Hyperlipidemia   ? Phlebitis Right  ? Leg  ? PONV (postoperative nausea and vomiting)   ? Stomach ulcer   ? ? ?Current Outpatient Medications:  ?  albuterol (VENTOLIN HFA) 108 (90 Base) MCG/ACT inhaler, Inhale 2 puffs into the lungs every 4 (four) hours as needed., Disp: , Rfl:  ?  amLODipine (NORVASC) 2.5 MG tablet, Take 1 tablet (2.5 mg total) by mouth daily., Disp: 90 tablet, Rfl: 3 ?  aspirin EC 81 MG tablet, Take 81 mg by mouth at bedtime., Disp: , Rfl:  ?  atorvastatin (LIPITOR) 20 MG tablet, Take 1 tablet (20 mg total) by mouth at bedtime., Disp: 90 tablet, Rfl: 3 ?  Calcium Carb-Cholecalciferol (OS-CAL CALCIUM + D3 PO), Take 1 tablet by  mouth daily. Calcium 1200 mg with Vitamin D, Disp: , Rfl:  ?  cetirizine (ZYRTEC) 10 MG tablet, Take 10 mg by mouth daily., Disp: , Rfl:  ?  cholecalciferol (VITAMIN D) 1000 units tablet, Take 1,000 Units by mouth daily., Disp: , Rfl:  ?  dextromethorphan 15 MG/5ML syrup, Take by mouth., Disp: , Rfl:  ?  diclofenac Sodium (VOLTAREN) 1 % GEL, Apply 2 g topically 4 (four) times daily., Disp: 50 g, Rfl: 1 ?  furosemide (LASIX) 20 MG tablet, Take 1 tablet (20 mg total) by mouth daily., Disp: 90 tablet, Rfl: 3 ?  hydrocortisone 1 % lotion, Apply 1 application topically 2 (two) times daily., Disp: 118 mL, Rfl: 0 ?  mirabegron ER (MYRBETRIQ) 25 MG TB24 tablet, Take 1 tablet (25 mg total) by mouth daily., Disp: 30 tablet, Rfl: 0 ?  Omega-3 Fatty Acids (FISH OIL) 1200 MG CAPS, Take 1 capsule by mouth every morning., Disp: , Rfl:  ?  predniSONE (DELTASONE) 20 MG tablet, Take by mouth., Disp: , Rfl:  ?  Psyllium (METAMUCIL PO), Take one (1) teaspoon by mouth daily at bedtime., Disp: , Rfl:  ?  pyridoxine (B-6) 200 MG tablet, Take 200 mg daily by mouth., Disp: , Rfl:  ?  vitamin B-12 (CYANOCOBALAMIN) 1000 MCG tablet, Take 1,000 mcg by mouth  daily., Disp: , Rfl:  ?Social History  ? ?Socioeconomic History  ? Marital status: Married  ?  Spouse name: Berneta Sages  ? Number of children: 5  ? Years of education: GED  ? Highest education level: GED or equivalent  ?Occupational History  ? Occupation: retired  ?Tobacco Use  ? Smoking status: Never  ? Smokeless tobacco: Never  ?Vaping Use  ? Vaping Use: Never used  ?Substance and Sexual Activity  ? Alcohol use: Yes  ?  Comment: Socially-wine  ? Drug use: No  ? Sexual activity: Yes  ?  Birth control/protection: Surgical  ?Other Topics Concern  ? Not on file  ?Social History Narrative  ? One level home with her husband   ? Husband has dementia and she is sole caregiver  ? Daughter lives next door  ? Enjoys line-dancing 1-2x per week and playing pickle ball at [employer]  ? ?Social Determinants  of Health  ? ?Financial Resource Strain: Low Risk   ? Difficulty of Paying Living Expenses: Not hard at all  ?Food Insecurity: No Food Insecurity  ? Worried About Charity fundraiser in the Last Year: Never true  ? Ran Out of Food in the Last Year: Never true  ?Transportation Needs: No Transportation Needs  ? Lack of Transportation (Medical): No  ? Lack of Transportation (Non-Medical): No  ?Physical Activity: Sufficiently Active  ? Days of Exercise per Week: 4 days  ? Minutes of Exercise per Session: 60 min  ?Stress: No Stress Concern Present  ? Feeling of Stress : Not at all  ?Social Connections: Socially Integrated  ? Frequency of Communication with Friends and Family: More than three times a week  ? Frequency of Social Gatherings with Friends and Family: More than three times a week  ? Attends Religious Services: More than 4 times per year  ? Active Member of Clubs or Organizations: Yes  ? Attends Archivist Meetings: More than 4 times per year  ? Marital Status: Married  ?Intimate Partner Violence: Not At Risk  ? Fear of Current or Ex-Partner: No  ? Emotionally Abused: No  ? Physically Abused: No  ? Sexually Abused: No  ? ?Family History  ?Problem Relation Age of Onset  ? Emphysema Father   ? Colon cancer Father   ? Lupus Child   ? Heart disease Sister   ? Heart disease Paternal Grandmother   ? Hypertension Paternal Grandmother   ? Heart disease Maternal Grandmother   ? Hypertension Maternal Grandmother   ? ? ?Objective: ?Office vital signs reviewed. ?BP (!) 160/66   Pulse 66   Temp 98.3 ?F (36.8 ?C) (Temporal)   Ht '5\' 1"'$  (1.549 m)   Wt 142 lb 6 oz (64.6 kg)   SpO2 96%   BMI 26.90 kg/m?  ? ?Physical Examination:  ?General: Awake, alert, well-appearing female, No acute distress ?HEENT: Moist mucous membranes.  Sclera white ?Cardio: regular rate and rhythm, S1S2 heard, no murmurs appreciated ?Pulm: clear to auscultation bilaterally, no wheezes, rhonchi or rales; normal work of breathing on room  air ? ? ?Assessment/ Plan: ?76 y.o. female  ? ?Viral URI ? ?Improving greatly.  Physical exam was unremarkable today.  She been having some side effects from the prednisone so I think it is fine that she stays off of that since there were no audible wheezes on exam and she seems to be having more intolerance to the medications and help.  She may continue all other medications as prescribed  if needed.  She understands red flag signs and symptoms which would warrant further evaluation.  Follow-up as needed ? ?No orders of the defined types were placed in this encounter. ? ?No orders of the defined types were placed in this encounter. ? ? ? ?Janora Norlander, DO ?Ashley [name] ?[phone] ? ?

## 2022-04-12 ENCOUNTER — Encounter: Payer: Self-pay | Admitting: Nurse Practitioner

## 2022-04-12 ENCOUNTER — Ambulatory Visit: Payer: Medicare HMO | Admitting: Nurse Practitioner

## 2022-04-12 VITALS — BP 106/60 | HR 85 | Temp 97.8°F | Resp 20 | Ht 61.0 in | Wt 143.0 lb

## 2022-04-12 DIAGNOSIS — J069 Acute upper respiratory infection, unspecified: Secondary | ICD-10-CM

## 2022-04-12 MED ORDER — ALBUTEROL SULFATE HFA 108 (90 BASE) MCG/ACT IN AERS: 2.0000 | INHALATION_SPRAY | Freq: Four times a day (QID) | RESPIRATORY_TRACT | 2 refills | Status: DC | PRN

## 2022-04-12 MED ORDER — PREDNISONE 10 MG (21) PO TBPK: ORAL_TABLET | ORAL | 0 refills | Status: DC

## 2022-04-12 NOTE — Patient Instructions (Signed)

## 2022-04-12 NOTE — Progress Notes (Signed)
Acute Office Visit  Subjective:     Patient ID: Ashley [name], female    DOB: 11-11-1946, 76 y.o.   MRN: [id number]  Chief Complaint  Patient presents with   URI    Cough, runny nose, ST     URI  This is a new problem. The current episode started yesterday. The problem has been unchanged. There has been no fever. Associated symptoms include congestion, coughing, headaches and a sore throat. Pertinent negatives include no chest pain, ear pain, rash, sinus pain or swollen glands. She has tried nothing for the symptoms. The treatment provided no relief.    Review of Systems  Constitutional: Negative.  Negative for chills and fever.  HENT:  Positive for congestion and sore throat. Negative for ear pain and sinus pain.   Respiratory:  Positive for cough.   Cardiovascular:  Negative for chest pain.  Skin:  Negative for rash.  Neurological:  Positive for headaches.  All other systems reviewed and are negative.      Objective:    BP 106/60   Pulse 85   Temp 97.8 F (36.6 C)   Resp 20   Ht '5\' 1"'$  (1.549 m)   Wt 143 lb (64.9 kg)   SpO2 96%   BMI 27.02 kg/m  Wt Readings from Last 3 Encounters:  04/12/22 143 lb (64.9 kg)  01/21/22 142 lb 6 oz (64.6 kg)  11/17/21 141 lb 12.8 oz (64.3 kg)      Physical Exam Vitals and nursing note reviewed.  Constitutional:      Appearance: Normal appearance.  HENT:     Head: Normocephalic.     Right Ear: External ear normal.     Left Ear: External ear normal.     Nose: Congestion present.  Eyes:     Conjunctiva/sclera: Conjunctivae normal.  Cardiovascular:     Rate and Rhythm: Normal rate and regular rhythm.     Pulses: Normal pulses.     Heart sounds: Normal heart sounds.  Pulmonary:     Effort: Pulmonary effort is normal.     Breath sounds: Normal breath sounds.  Abdominal:     General: Bowel sounds are normal.  Musculoskeletal:        General: Normal range of motion.  Skin:    General: Skin is warm.     Findings: No  rash.  Neurological:     General: No focal deficit present.     Mental Status: She is alert and oriented to person, place, and time.  Psychiatric:        Behavior: Behavior normal.    No results found for any visits on 04/12/22.      Assessment & Plan:  Take meds as prescribed - Use a cool mist humidifier  -Use saline nose sprays frequently -Force fluids -For fever or aches or pains- take Tylenol or ibuprofen. -Rapid strep negative for strep. -If symptoms do not improve, she may need to be COVID tested to rule this out Follow up with worsening unresolved symptoms  Problem List Items Addressed This Visit   None Visit Diagnoses     Viral upper respiratory tract infection    -  Primary   Relevant Medications   predniSONE (STERAPRED UNI-PAK 21 TAB) 10 MG (21) TBPK tablet   albuterol (VENTOLIN HFA) 108 (90 Base) MCG/ACT inhaler       Meds ordered this encounter  Medications   predniSONE (STERAPRED UNI-PAK 21 TAB) 10 MG (21) TBPK tablet  Sig: 6 tablets day 1, 5 tablet day 2,, 4 tablet day 3, 3 tablet day 4, 2 tablet day 5, 1 tablet day 6    Dispense:  1 each    Refill:  0    Order Specific Question:   Supervising Provider    Answer:   Jeneen Rinks   albuterol (VENTOLIN HFA) 108 (90 Base) MCG/ACT inhaler    Sig: Inhale 2 puffs into the lungs every 6 (six) hours as needed for wheezing or shortness of breath.    Dispense:  8 g    Refill:  2    Order Specific Question:   Supervising Provider    Answer:   Claretta Fraise [[id number]]    Return if symptoms worsen or fail to improve.  Ivy Lynn, NP

## 2022-05-23 ENCOUNTER — Encounter: Payer: Self-pay | Admitting: Family Medicine

## 2022-05-23 ENCOUNTER — Ambulatory Visit (INDEPENDENT_AMBULATORY_CARE_PROVIDER_SITE_OTHER): Payer: Medicare HMO | Admitting: Family Medicine

## 2022-05-23 VITALS — BP 133/63 | HR 80 | Temp 98.1°F | Ht 61.0 in | Wt 144.6 lb

## 2022-05-23 DIAGNOSIS — L729 Follicular cyst of the skin and subcutaneous tissue, unspecified: Secondary | ICD-10-CM

## 2022-05-23 DIAGNOSIS — R159 Full incontinence of feces: Secondary | ICD-10-CM

## 2022-05-23 DIAGNOSIS — R152 Fecal urgency: Secondary | ICD-10-CM

## 2022-05-23 NOTE — Patient Instructions (Signed)
Epidermoid Cyst  An epidermoid cyst, also called an epidermal cyst, is a small lump under your skin. The cyst contains a substance called keratin. Do not try to pop or open the cyst yourself. What are the causes? A blocked hair follicle. A hair that curls and re-enters the skin instead of growing straight out of the skin. A blocked pore. Irritated skin. An injury to the skin. Certain conditions that are passed along from parent to child. Human papillomavirus (HPV). This happens rarely when cysts occur on the bottom of the feet. Long-term sun damage to the skin. What increases the risk? Having acne. Being female. Having an injury to the skin. Being past puberty. Having certain conditions caused by genes (genetic disorder) What are the signs or symptoms? These cysts are usually harmless, but they can get infected. Symptoms of infection may include: Redness. Inflammation. Tenderness. Warmth. Fever. A bad-smelling substance that drains from the cyst. Pus that drains from the cyst. How is this treated? In many cases, epidermoid cysts go away on their own without treatment. If a cyst becomes infected, treatment may include: Opening and draining the cyst, done by a doctor. After draining, you may need minor surgery to remove the rest of the cyst. Antibiotic medicine. Shots of medicines (steroids) that help to reduce inflammation. Surgery to remove the cyst. Surgery may be done if the cyst: Becomes large. Bothers you. Has a chance of turning into cancer. Do not try to open a cyst yourself. Follow these instructions at home: Medicines Take over-the-counter and prescription medicines as told by your doctor. If you were prescribed an antibiotic medicine, take it as told by your doctor. Do not stop taking it even if you start to feel better. General instructions Keep the area around your cyst clean and dry. Wear loose, dry clothing. Avoid touching your cyst. Check your cyst every day  for signs of infection. Check for: Redness, swelling, or pain. Fluid or blood. Warmth. Pus or a bad smell. Keep all follow-up visits. How is this prevented? Wear clean, dry, clothing. Avoid wearing tight clothing. Keep your skin clean and dry. Take showers or baths every day. Contact a doctor if: Your cyst has symptoms of infection. Your condition does not improve or gets worse. You have a cyst that looks different from other cysts you have had. You have a fever. Get help right away if: Redness spreads from the cyst into the area close by. Summary An epidermoid cyst is a small lump under your skin. If a cyst becomes infected, treatment may include surgery to open and drain the cyst, or to remove it. Take over-the-counter and prescription medicines only as told by your doctor. Contact a doctor if your condition is not improving or is getting worse. Keep all follow-up visits. This information is not intended to replace advice given to you by your health care provider. Make sure you discuss any questions you have with your health care provider. Document Revised: 02/05/2020 Document Reviewed: 02/05/2020 Elsevier Patient Education  2023 Elsevier Inc.  

## 2022-05-23 NOTE — Progress Notes (Signed)
Subjective: CC: General follow-up PCP: Janora Norlander, DO Ashley [name] is a 76 y.o. female presenting to clinic today for:  1.  Loose stools Patient reports that she has been having intermittent loose stools where she will have fecal incontinence.  She reports urgency but sometimes does not even realize how much she is stooling.  Her most recent episode was today.  She uses Metamucil in efforts to bulk her stool really has not had much success.  Her last GI evaluation was with Dr. Laural Golden when he was still in [place].  She denies any blood in stool.  No abdominal pain, nausea, vomiting.  Never had a diagnosis of IBS.  2.  Scalp cyst Patient reports that she has had a very large scalp cyst removed previously and now she has some small BB like ones at the posterior scalp.  Just wanted my opinion on this.  Does not feel that they are growing and they are not painful  ROS: Per HPI  Allergies  Allergen Reactions   Sulfa Antibiotics Swelling    Angoedema   Demerol Nausea And Vomiting   Lisinopril Swelling    angioedema . Uncertain if due to Sulfa or from ACE-I but ACE-I was discontinued to be safe.   Moviprep [Peg-Kcl-Nacl-Nasulf-Na Asc-C] Nausea And Vomiting   Declomycin [Demeclocycline] Rash   Past Medical History:  Diagnosis Date   Coronary artery disease 01/08/10   STATUS POST STENTING OF THE LAD AND THE FIRST OBTUSE MARGINAL VESSEL   Diverticulitis    Headache(784.0)    Heart disease    Herpes zoster    Hypercholesterolemia    Hyperlipidemia    Phlebitis Right   Leg   PONV (postoperative nausea and vomiting)    Stomach ulcer     Current Outpatient Medications:    albuterol (VENTOLIN HFA) 108 (90 Base) MCG/ACT inhaler, Inhale 2 puffs into the lungs every 6 (six) hours as needed for wheezing or shortness of breath., Disp: 8 g, Rfl: 2   amLODipine (NORVASC) 2.5 MG tablet, Take 1 tablet (2.5 mg total) by mouth daily., Disp: 90 tablet, Rfl: 3   aspirin EC 81 MG tablet,  Take 81 mg by mouth at bedtime., Disp: , Rfl:    atorvastatin (LIPITOR) 20 MG tablet, Take 1 tablet (20 mg total) by mouth at bedtime., Disp: 90 tablet, Rfl: 3   Calcium Carb-Cholecalciferol (OS-CAL CALCIUM + D3 PO), Take 1 tablet by mouth daily. Calcium 1200 mg with Vitamin D, Disp: , Rfl:    cetirizine (ZYRTEC) 10 MG tablet, Take 10 mg by mouth daily., Disp: , Rfl:    cholecalciferol (VITAMIN D) 1000 units tablet, Take 1,000 Units by mouth daily., Disp: , Rfl:    dextromethorphan 15 MG/5ML syrup, Take by mouth., Disp: , Rfl:    diclofenac Sodium (VOLTAREN) 1 % GEL, Apply 2 g topically 4 (four) times daily., Disp: 50 g, Rfl: 1   furosemide (LASIX) 20 MG tablet, Take 1 tablet (20 mg total) by mouth daily., Disp: 90 tablet, Rfl: 3   hydrocortisone 1 % lotion, Apply 1 application topically 2 (two) times daily., Disp: 118 mL, Rfl: 0   mirabegron ER (MYRBETRIQ) 25 MG TB24 tablet, Take 1 tablet (25 mg total) by mouth daily., Disp: 30 tablet, Rfl: 0   Omega-3 Fatty Acids (FISH OIL) 1200 MG CAPS, Take 1 capsule by mouth every morning., Disp: , Rfl:    Psyllium (METAMUCIL PO), Take one (1) teaspoon by mouth daily at bedtime., Disp: , Rfl:  pyridoxine (B-6) 200 MG tablet, Take 200 mg daily by mouth., Disp: , Rfl:    vitamin B-12 (CYANOCOBALAMIN) 1000 MCG tablet, Take 1,000 mcg by mouth daily., Disp: , Rfl:  Social History   Socioeconomic History   Marital status: Married    Spouse name: Berneta Sages   Number of children: 5   Years of education: GED   Highest education level: GED or equivalent  Occupational History   Occupation: retired  Tobacco Use   Smoking status: Never   Smokeless tobacco: Never  Vaping Use   Vaping Use: Never used  Substance and Sexual Activity   Alcohol use: Yes    Comment: Socially-wine   Drug use: No   Sexual activity: Yes    Birth control/protection: Surgical  Other Topics Concern   Not on file  Social History Narrative   One level home with her husband    Husband  has dementia and she is sole caregiver   Daughter lives next door   Enjoys line-dancing 1-2x per week and playing pickle ball at [employer]   Social Determinants of Health   Financial Resource Strain: [place]  (11/09/2021)   Overall Financial Resource Strain (CARDIA)    Difficulty of Paying Living Expenses: Not hard at all  Food Insecurity: No Food Insecurity (11/09/2021)   Hunger Vital Sign    Worried About Running Out of Food in the Last Year: Never true    [employer] in the Last Year: Never true  Transportation Needs: No Transportation Needs (11/09/2021)   PRAPARE - Hydrologist (Medical): No    Lack of Transportation (Non-Medical): No  Physical Activity: Sufficiently Active (11/09/2021)   Exercise Vital Sign    Days of Exercise per Week: 4 days    Minutes of Exercise per Session: 60 min  Stress: No Stress Concern Present (11/09/2021)   [place]    Feeling of Stress : Not at all  Social Connections: [place] (11/09/2021)   Social Connection and Isolation Panel [NHANES]    Frequency of Communication with Friends and Family: More than three times a week    Frequency of Social Gatherings with Friends and Family: More than three times a week    Attends Religious Services: More than 4 times per year    Active Member of [employer] or Organizations: Yes    Attends Music therapist: More than 4 times per year    Marital Status: Married  Human resources officer Violence: Not At Risk (11/09/2021)   Humiliation, Afraid, Rape, and Kick questionnaire    Fear of Current or Ex-Partner: No    Emotionally Abused: No    Physically Abused: No    Sexually Abused: No   Family History  Problem Relation Age of Onset   Emphysema Father    Colon cancer Father    Lupus Child    Heart disease Sister    Heart disease Paternal Grandmother    Hypertension Paternal Grandmother    Heart  disease Maternal Grandmother    Hypertension Maternal Grandmother     Objective: Office vital signs reviewed. BP 133/63   Pulse 80   Temp 98.1 F (36.7 C)   Ht '5\' 1"'$  (1.549 m)   Wt 144 lb 9.6 oz (65.6 kg)   SpO2 98%   BMI 27.32 kg/m   Physical Examination:  General: Awake, alert, well nourished, No acute distress HEENT: Small, sebaceous cyst appreciated along  the posterior left scalp Cardio: regular rate and rhythm, S1S2 heard, no murmurs appreciated Pulm: clear to auscultation bilaterally, no wheezes, rhonchi or rales; normal work of breathing on room air GI: soft, non-tender, non-distended, bowel sounds present x4, no hepatomegaly, no splenomegaly, no masses  Assessment/ Plan: 76 y.o. female   Incontinence of feces with fecal urgency - Plan: Ambulatory referral to Gastroenterology  Scalp cyst  Referral to GI placed.  Reassurance of scalp cysts as these are benign.  Discussed indications for removal  No orders of the defined types were placed in this encounter.  No orders of the defined types were placed in this encounter.    Janora Norlander, DO [place] [phone]

## 2022-06-01 ENCOUNTER — Other Ambulatory Visit (HOSPITAL_COMMUNITY): Payer: Self-pay | Admitting: Family Medicine

## 2022-06-01 DIAGNOSIS — Z1231 Encounter for screening mammogram for malignant neoplasm of breast: Secondary | ICD-10-CM

## 2022-06-14 ENCOUNTER — Ambulatory Visit: Payer: Medicare HMO | Admitting: Nurse Practitioner

## 2022-06-14 ENCOUNTER — Encounter: Payer: Self-pay | Admitting: Nurse Practitioner

## 2022-06-14 VITALS — BP 146/57 | HR 75 | Temp 98.4°F | Ht 61.0 in | Wt 147.8 lb

## 2022-06-14 DIAGNOSIS — R609 Edema, unspecified: Secondary | ICD-10-CM | POA: Diagnosis not present

## 2022-06-14 NOTE — Progress Notes (Signed)
Acute Office Visit  Subjective:     Patient ID: Ashley [name], female    DOB: April 20, 1946, 76 y.o.   MRN: [id number]  Chief Complaint  Patient presents with   Edema    HPI Patient is in today for Edema: Patient complains of edema. The location of the edema is lower leg(s) bilateral.  The edema has been moderate.  Onset of symptoms was a few months ago, in the past 3 days left leg is edematous, this is new because patiens 'edema was only on right leg.  C since that time. The edema is present all day. The patient states edema in right leg is chronic but left ankle and leg is new.  The swelling has been aggravated by nothing, relieved by nothing, and been associated with nothing. Cardiac risk factors include advanced age (older than 24 for men, 31 for women) and hypertension.   Patient is a 76 year old female who presents with a history of hypertension and elevated blood pressure in clinic today.  No headache, nausea or vision problems associated with current concerns.  Patient is reporting worsening bilateral lower extremity edema with right greater than left.  No recent changes to diet or excessive sodium intake in the past few days.  Patient is taking medication as prescribed.  Review of Systems  Constitutional: Negative.   HENT: Negative.    Eyes: Negative.   Respiratory: Negative.    Cardiovascular:  Positive for leg swelling.  Gastrointestinal:  Negative for nausea and vomiting.  Skin: Negative.   All other systems reviewed and are negative.       Objective:    BP (!) 146/57   Pulse 75   Temp 98.4 F (36.9 C)   Ht '5\' 1"'$  (1.549 m)   Wt 147 lb 12.8 oz (67 kg)   SpO2 98%   BMI 27.93 kg/m  BP Readings from Last 3 Encounters:  06/14/22 (!) 146/57  05/23/22 133/63  04/12/22 106/60      Physical Exam Vitals and nursing note reviewed.  Constitutional:      Appearance: Normal appearance.  HENT:     Right Ear: External ear normal.     Left Ear: External ear normal.      Nose: Nose normal.  Eyes:     Conjunctiva/sclera: Conjunctivae normal.  Cardiovascular:     Rate and Rhythm: Normal rate and regular rhythm.     Pulses: Normal pulses. No decreased pulses.     Heart sounds: Normal heart sounds.     Comments: new left lower extremity edema ankle/leg edema. Pulmonary:     Effort: Pulmonary effort is normal.     Breath sounds: Normal breath sounds.  Abdominal:     General: Bowel sounds are normal.  Skin:    Findings: No erythema or rash.  Neurological:     General: No focal deficit present.     Mental Status: She is alert and oriented to person, place, and time.  Psychiatric:        Mood and Affect: Mood normal.        Behavior: Behavior normal.     No results found for any visits on 06/14/22.      Assessment & Plan:  Educated patient to elevate feet, compression socks, hold amlodipine for a day or 2 to see if edema has been caused by medication.  Increase Lasix by 1 pill for 2 days.  Continue to monitor symptoms follow-up with worsening unresolved symptoms. Problem List Items Addressed This  Visit   None Visit Diagnoses     Edema, unspecified type    -  Primary   Relevant Orders   For home use only DME Other see comment       No orders of the defined types were placed in this encounter.   Return if symptoms worsen or fail to improve.  Ivy Lynn, NP

## 2022-06-14 NOTE — Patient Instructions (Signed)
Edema ? ?Edema is when you have too much fluid in your body or under your skin. Edema may make your legs, feet, and ankles swell. Swelling often happens in looser tissues, such as around your eyes. This is a common condition. It gets more common as you get older. ?There are many possible causes of edema. These include: ?Eating too much salt (sodium). ?Being on your feet or sitting for a long time. ?Certain medical conditions, such as: ?Pregnancy. ?Heart failure. ?Liver disease. ?Kidney disease. ?Cancer. ?Hot weather may make edema worse. Edema is usually painless. Your skin may look swollen or shiny. ?Follow these instructions at home: ?Medicines ?Take over-the-counter and prescription medicines only as told by your doctor. ?Your doctor may prescribe a medicine to help your body get rid of extra water (diuretic). Take this medicine if you are told to take it. ?Eating and drinking ?Eat a low-salt (low-sodium) diet as told by your doctor. Sometimes, eating less salt may reduce swelling. ?Depending on the cause of your swelling, you may need to limit how much fluid you drink (fluid restriction). ?General instructions ?Raise the injured area above the level of your heart while you are sitting or lying down. ?Do not sit still or stand for a long time. ?Do not wear tight clothes. Do not wear garters on your upper legs. ?Exercise your legs. This can help the swelling go down. ?Wear compression stockings as told by your doctor. It is important that these are the right size. These should be prescribed by your doctor to prevent possible injuries. ?If elastic bandages or wraps are recommended, use them as told by your doctor. ?Contact a doctor if: ?Treatment is not working. ?You have heart, liver, or kidney disease and have symptoms of edema. ?You have sudden and unexplained weight gain. ?Get help right away if: ?You have shortness of breath or chest pain. ?You cannot breathe when you lie down. ?You have pain, redness, or  warmth in the swollen areas. ?You have heart, liver, or kidney disease and get edema all of a sudden. ?You have a fever and your symptoms get worse all of a sudden. ?These symptoms may be an emergency. Get help right away. Call 911. ?Do not wait to see if the symptoms will go away. ?Do not drive yourself to the hospital. ?Summary ?Edema is when you have too much fluid in your body or under your skin. ?Edema may make your legs, feet, and ankles swell. Swelling often happens in looser tissues, such as around your eyes. ?Raise the injured area above the level of your heart while you are sitting or lying down. ?Follow your doctor's instructions about diet and how much fluid you can drink. ?This information is not intended to replace advice given to you by your health care provider. Make sure you discuss any questions you have with your health care provider. ?Document Revised: 07/05/2021 Document Reviewed: 07/05/2021 ?Elsevier Patient Education ? 2023 Elsevier Inc. ? ?

## 2022-06-21 ENCOUNTER — Telehealth: Payer: Self-pay | Admitting: Family Medicine

## 2022-06-21 ENCOUNTER — Other Ambulatory Visit: Payer: Self-pay | Admitting: Nurse Practitioner

## 2022-06-21 DIAGNOSIS — R609 Edema, unspecified: Secondary | ICD-10-CM

## 2022-06-21 NOTE — Telephone Encounter (Signed)
Pt rc. The last time she seen cardiologist was two years ago. She would like to see Dr Percival Spanish when he comes to [place].

## 2022-06-21 NOTE — Telephone Encounter (Signed)
NA

## 2022-06-21 NOTE — Telephone Encounter (Signed)
Referral completed

## 2022-06-21 NOTE — Telephone Encounter (Signed)
Per pt: wants a referral to cardiology with dr. Percival Spanish for feet/leg sweeling

## 2022-06-25 ENCOUNTER — Other Ambulatory Visit: Payer: Self-pay | Admitting: Family Medicine

## 2022-07-07 ENCOUNTER — Ambulatory Visit (HOSPITAL_COMMUNITY)
Admission: RE | Admit: 2022-07-07 | Discharge: 2022-07-07 | Disposition: A | Discharge status: Home / Self Care | Payer: Medicare HMO | Source: Ambulatory Visit | Attending: Family Medicine | Admitting: Family Medicine

## 2022-07-07 DIAGNOSIS — Z1231 Encounter for screening mammogram for malignant neoplasm of breast: Secondary | ICD-10-CM | POA: Diagnosis present

## 2022-07-13 ENCOUNTER — Encounter: Payer: Self-pay | Admitting: Cardiology

## 2022-07-13 NOTE — Progress Notes (Deleted)
Cardiology Office Note   Date:  07/13/2022   ID:  Ashley [name], DOB [birth year], MRN [id number]  PCP:  Janora Norlander, DO  Cardiologist:   None Referring:  ***  No chief complaint on file.     History of Present Illness: Ashley [name] is a 76 y.o. female who presents for ***  .  She saw Dr. Martinique and had stenting of the proximal LAD in Feb 2011.  The patient had a normal stress echo in 2015.  ***    Past Medical History:  Diagnosis Date   Coronary artery disease 01/08/10   STATUS POST STENTING OF THE LAD AND THE FIRST OBTUSE MARGINAL VESSEL   Diverticulitis    Headache(784.0)    Heart disease    Herpes zoster    Hypercholesterolemia    Hyperlipidemia    Phlebitis Right   Leg   PONV (postoperative nausea and vomiting)    Stomach ulcer     Past Surgical History:  Procedure Laterality Date   ABDOMINAL HYSTERECTOMY     with bladder tack   BREAST BIOPSY Left    Normal   Bunionectomy Right    CARDIAC CATHETERIZATION  01/08/10   CARDIAC CATHETERIZATION  01/05/10   NORMAL LEFT VENTRICULAR SIZE AND NORMAL SYSTOLIC FUNCTION. EF IS 60%.    CATARACT EXTRACTION     COLONOSCOPY  07/13/2011   RMR:Lax anal sphincter tone otherwise normal/left sided diverticula, polyp removed but not analyzed   COLONOSCOPY N/A 09/21/2016   Procedure: COLONOSCOPY;  Surgeon: Daneil Dolin, MD;  [facility]: [facility];  Service: Endoscopy;  Laterality: N/A;  1:00 pm   CORONARY STENT PLACEMENT     VAGINAL HYSTERECTOMY     VEIN LIGATION       Current Outpatient Medications  Medication Sig Dispense Refill   albuterol (VENTOLIN HFA) 108 (90 Base) MCG/ACT inhaler Inhale 2 puffs into the lungs every 6 (six) hours as needed for wheezing or shortness of breath. 8 g 2   amLODipine (NORVASC) 2.5 MG tablet TAKE 1 TABLET DAILY 90 tablet 0   aspirin EC 81 MG tablet Take 81 mg by mouth at bedtime.     atorvastatin (LIPITOR) 20 MG tablet Take 1 tablet (20 mg total) by mouth at bedtime. 90 tablet 3    Calcium Carb-Cholecalciferol (OS-CAL CALCIUM + D3 PO) Take 1 tablet by mouth daily. Calcium 1200 mg with Vitamin D     cetirizine (ZYRTEC) 10 MG tablet Take 10 mg by mouth daily.     cholecalciferol (VITAMIN D) 1000 units tablet Take 1,000 Units by mouth daily.     dextromethorphan 15 MG/5ML syrup Take by mouth.     diclofenac Sodium (VOLTAREN) 1 % GEL Apply 2 g topically 4 (four) times daily. 50 g 1   furosemide (LASIX) 20 MG tablet Take 1 tablet (20 mg total) by mouth daily. 90 tablet 3   hydrocortisone 1 % lotion Apply 1 application topically 2 (two) times daily. 118 mL 0   mirabegron ER (MYRBETRIQ) 25 MG TB24 tablet Take 1 tablet (25 mg total) by mouth daily. 30 tablet 0   Omega-3 Fatty Acids (FISH OIL) 1200 MG CAPS Take 1 capsule by mouth every morning.     Psyllium (METAMUCIL PO) Take one (1) teaspoon by mouth daily at bedtime.     pyridoxine (B-6) 200 MG tablet Take 200 mg daily by mouth.     vitamin B-12 (CYANOCOBALAMIN) 1000 MCG tablet Take 1,000 mcg by mouth daily.  No current facility-administered medications for this visit.    Allergies:   Sulfa antibiotics, Demerol, Lisinopril, Moviprep [peg-kcl-nacl-nasulf-na asc-c], and Declomycin [demeclocycline]    Social History:  The patient  reports that she has never smoked. She has never used smokeless tobacco. She reports current alcohol use. She reports that she does not use drugs.   Family History:  The patient's ***family history includes Colon cancer in her father; Emphysema in her father; Heart disease in her maternal grandmother, paternal grandmother, and sister; Hypertension in her maternal grandmother and paternal grandmother; Lupus in her child.    ROS:  Please see the history of present illness.   Otherwise, review of systems are positive for {NONE DEFAULTED:18576}.   All other systems are reviewed and negative.    PHYSICAL EXAM: VS:  There were no vitals taken for this visit. , BMI There is no height or weight on  file to calculate BMI. GENERAL:  Well appearing HEENT:  Pupils equal round and reactive, fundi not visualized, oral mucosa unremarkable NECK:  No jugular venous distention, waveform within normal limits, carotid upstroke brisk and symmetric, no bruits, no thyromegaly LYMPHATICS:  No cervical, inguinal adenopathy LUNGS:  Clear to auscultation bilaterally BACK:  No CVA tenderness CHEST:  Unremarkable HEART:  PMI not displaced or sustained,S1 and S2 within normal limits, no S3, no S4, no clicks, no rubs, *** murmurs ABD:  Flat, positive bowel sounds normal in frequency in pitch, no bruits, no rebound, no guarding, no midline pulsatile mass, no hepatomegaly, no splenomegaly EXT:  2 plus pulses throughout, no edema, no cyanosis no clubbing SKIN:  No rashes no nodules NEURO:  Cranial nerves II through XII grossly intact, motor grossly intact throughout PSYCH:  Cognitively intact, oriented to person place and time    EKG:  EKG {ACTION; IS/IS UEA:[id number]} ordered today. The ekg ordered today demonstrates ***   Recent Labs: 11/17/2021: BUN 13; Creatinine, Ser 0.95; Magnesium 2.1; Potassium 4.6; Sodium 141    Lipid Panel    Component Value Date/Time   CHOL 144 06/22/2021 1128   TRIG 47 06/22/2021 1128   HDL 63 06/22/2021 1128   CHOLHDL 2.3 06/22/2021 1128   LDLCALC 70 06/22/2021 1128      Wt Readings from Last 3 Encounters:  06/14/22 147 lb 12.8 oz (67 kg)  05/23/22 144 lb 9.6 oz (65.6 kg)  04/12/22 143 lb (64.9 kg)      Other studies Reviewed: Additional studies/ records that were reviewed today include: ***. Review of the above records demonstrates:  Please see elsewhere in the note.  ***   ASSESSMENT AND PLAN:  CAD:  ***   Current medicines are reviewed at length with the patient today.  The patient {ACTIONS; HAS/DOES NOT HAVE:19233} concerns regarding medicines.  The following changes have been made:  {PLAN; NO CHANGE:13088:s}  Labs/ tests ordered today include:  *** No orders of the defined types were placed in this encounter.    Disposition:   FU with ***    Signed, Minus Breeding, MD  07/13/2022 9:34 PM    [facility]

## 2022-07-14 ENCOUNTER — Ambulatory Visit: Payer: Medicare HMO | Admitting: Cardiology

## 2022-08-04 ENCOUNTER — Telehealth: Payer: Self-pay | Admitting: Family Medicine

## 2022-08-04 NOTE — Telephone Encounter (Signed)
Pt called stating that she has a new patient appt with Dr Percival Spanish on 08/17/22 and was told that it would be best for her to have lab work done before her appt so that Dr Percival Spanish can see what all of her levels are.  Pt wants to know if Dr Lajuana Ripple can order the lab work and she will come to have labs done before her appt on 08/17/22.   Please advise and call patient.

## 2022-08-09 ENCOUNTER — Other Ambulatory Visit: Payer: Self-pay | Admitting: Family Medicine

## 2022-08-09 ENCOUNTER — Other Ambulatory Visit: Payer: Medicare HMO

## 2022-08-09 DIAGNOSIS — I10 Essential (primary) hypertension: Secondary | ICD-10-CM

## 2022-08-09 DIAGNOSIS — E7800 Pure hypercholesterolemia, unspecified: Secondary | ICD-10-CM

## 2022-08-09 NOTE — Telephone Encounter (Signed)
Pt aware.

## 2022-08-09 NOTE — Telephone Encounter (Signed)
Labs ordered.

## 2022-08-10 LAB — LIPID PANEL
Chol/HDL Ratio: 2.7 ratio (ref 0.0–4.4)
Cholesterol, Total: 178 mg/dL (ref 100–199)
HDL: 65 mg/dL (ref >39)
LDL Chol Calc (NIH): 98 mg/dL (ref 0–99)
Triglycerides: 79 mg/dL (ref 0–149)
VLDL Cholesterol Cal: 15 mg/dL (ref 5–40)

## 2022-08-10 LAB — CMP14+EGFR
ALT: 30 IU/L (ref 0–32)
AST: 36 IU/L (ref 0–40)
Albumin/Globulin Ratio: 2.1 (ref 1.2–2.2)
Albumin: 4.5 g/dL (ref 3.8–4.8)
Alkaline Phosphatase: 54 IU/L (ref 44–121)
BUN/Creatinine Ratio: 17 (ref 12–28)
BUN: 14 mg/dL (ref 8–27)
Bilirubin Total: 0.5 mg/dL (ref 0.0–1.2)
CO2: 27 mmol/L (ref 20–29)
Calcium: 9.4 mg/dL (ref 8.7–10.3)
Chloride: 100 mmol/L (ref 96–106)
Creatinine, Ser: 0.84 mg/dL (ref 0.57–1.00)
Globulin, Total: 2.1 g/dL (ref 1.5–4.5)
Glucose: 82 mg/dL (ref 70–99)
Potassium: 4.1 mmol/L (ref 3.5–5.2)
Sodium: 142 mmol/L (ref 134–144)
Total Protein: 6.6 g/dL (ref 6.0–8.5)
eGFR: 72 mL/min/{1.73_m2} (ref >59)

## 2022-08-16 DIAGNOSIS — M7989 Other specified soft tissue disorders: Secondary | ICD-10-CM | POA: Insufficient documentation

## 2022-08-16 NOTE — Progress Notes (Unsigned)
Cardiology Office Note   Date:  08/17/2022   ID:  MILIANNA Ashley [name], DOB October 06, 1946, MRN [id number]  PCP:  Janora Norlander, DO  Cardiologist:   None Referring:  Janora Norlander, DO  Chief Complaint  Patient presents with   Shortness of Breath      History of Present Illness: Ashley [name] is a 76 y.o. female who presents for follow up of CAD.    She is status post stenting of the proximal LAD and first obtuse marginal vessels in February 2011 with bare-metal stents. She had a normal stress Echo in 3/15.  She was seeing Dr. Martinique.  She is moving her care here as she lives in this area.  She reports that she has been getting occasional shortness of breath.  This happens when she walks across her yard or to her mailbox which might be less than 100 yards.  This is not happening every time.  She recovers quickly after a minute or so.  She is not describing any of the chest discomfort or left arm discomfort that was her previous angina.  She is not having any new presyncope or syncope.  She is not having any new weight gain or edema.  She does not think this was similar to the symptoms that she had at the time of her stent.  She does remain active and she does line dancing without bringing on any symptoms.  She is not having any resting shortness of breath, PND or orthopnea.  She has had some palpitations.  These have been sporadic.  She feels a flipping and flopping.  These are occurring at random and not precipitated by activities or anything that she can identify.  She is not having any sustained symptomatic dysrhythmias.     Past Medical History:  Diagnosis Date   Coronary artery disease 01/08/2010   STATUS POST STENTING OF THE LAD AND THE FIRST OBTUSE MARGINAL VESSEL   Diverticulitis    Headache(784.0)    Herpes zoster    Hyperlipidemia    Phlebitis Right   Leg   PONV (postoperative nausea and vomiting)    Stomach ulcer     Past Surgical History:  Procedure Laterality  Date   ABDOMINAL HYSTERECTOMY     with bladder tack   BREAST BIOPSY Left    Normal   Bunionectomy Right    CARDIAC CATHETERIZATION  01/08/10   CARDIAC CATHETERIZATION  01/05/10   NORMAL LEFT VENTRICULAR SIZE AND NORMAL SYSTOLIC FUNCTION. EF IS 60%.    CATARACT EXTRACTION     COLONOSCOPY  07/13/2011   RMR:Lax anal sphincter tone otherwise normal/left sided diverticula, polyp removed but not analyzed   COLONOSCOPY N/A 09/21/2016   Procedure: COLONOSCOPY;  Surgeon: Daneil Dolin, MD;  Location: [facility];  Service: Endoscopy;  Laterality: N/A;  1:00 pm   CORONARY STENT PLACEMENT     VAGINAL HYSTERECTOMY     VEIN LIGATION       Current Outpatient Medications  Medication Sig Dispense Refill   albuterol (VENTOLIN HFA) 108 (90 Base) MCG/ACT inhaler Inhale 2 puffs into the lungs every 6 (six) hours as needed for wheezing or shortness of breath. 8 g 2   amLODipine (NORVASC) 2.5 MG tablet TAKE 1 TABLET DAILY 90 tablet 0   aspirin EC 81 MG tablet Take 81 mg by mouth at bedtime.     atorvastatin (LIPITOR) 40 MG tablet Take 1 tablet (40 mg total) by mouth daily. 90 tablet 3  Calcium Carb-Cholecalciferol (OS-CAL CALCIUM + D3 PO) Take 1 tablet by mouth daily. Calcium 1200 mg with Vitamin D     cetirizine (ZYRTEC) 10 MG tablet Take 10 mg by mouth daily.     cholecalciferol (VITAMIN D) 1000 units tablet Take 1,000 Units by mouth daily.     furosemide (LASIX) 20 MG tablet TAKE 1 TABLET DAILY 90 tablet 0   Omega-3 Fatty Acids (FISH OIL) 1200 MG CAPS Take 1 capsule by mouth every morning.     Psyllium (METAMUCIL PO) Take one (1) teaspoon by mouth daily at bedtime.     pyridoxine (B-6) 200 MG tablet Take 200 mg daily by mouth.     vitamin B-12 (CYANOCOBALAMIN) 1000 MCG tablet Take 1,000 mcg by mouth daily.     No current facility-administered medications for this visit.    Allergies:   Sulfa antibiotics, Demerol, Lisinopril, Moviprep [peg-kcl-nacl-nasulf-na asc-c], and Declomycin [demeclocycline]     Social History:  The patient  reports that she has never smoked. She has never used smokeless tobacco. She reports current alcohol use. She reports that she does not use drugs.   Family History:  The patient's family history includes Colon cancer in her father; Emphysema in her father; Heart disease in her maternal grandmother, paternal grandmother, and sister; Hypertension in her maternal grandmother and paternal grandmother; Lupus in her child.    ROS:  Please see the history of present illness.   Otherwise, review of systems are positive for none.   All other systems are reviewed and negative.    PHYSICAL EXAM: VS:  BP 130/64   Pulse 70   Ht 5' (1.524 m)   Wt 146 lb (66.2 kg)   BMI 28.51 kg/m  , BMI Body mass index is 28.51 kg/m. GENERAL:  Well appearing HEENT:  Pupils equal round and reactive, fundi not visualized, oral mucosa unremarkable NECK:  No jugular venous distention, waveform within normal limits, carotid upstroke brisk and symmetric, left carotid bruits, no thyromegaly LYMPHATICS:  No cervical, inguinal adenopathy LUNGS:  Clear to auscultation bilaterally BACK:  No CVA tenderness CHEST:  Unremarkable HEART:  PMI not displaced or sustained,S1 and S2 within normal limits, no S3, no S4, no clicks, no rubs, no murmurs ABD:  Flat, positive bowel sounds normal in frequency in pitch, no bruits, no rebound, no guarding, no midline pulsatile mass, no hepatomegaly, no splenomegaly EXT:  2 plus pulses throughout, no edema, no cyanosis no clubbing SKIN:  No rashes no nodules NEURO:  Cranial nerves II through XII grossly intact, motor grossly intact throughout PSYCH:  Cognitively intact, oriented to person place and time    EKG:  EKG is ordered today. The ekg ordered today demonstrates sinus rhythm, rate 70, axis within normal limits, intervals within normal limits, no acute ST-T wave changes.   Recent Labs: 11/17/2021: Magnesium 2.1 08/09/2022: ALT 30; BUN 14; Creatinine,  Ser 0.84; Potassium 4.1; Sodium 142    Lipid Panel    Component Value Date/Time   CHOL 178 08/09/2022 1400   TRIG 79 08/09/2022 1400   HDL 65 08/09/2022 1400   CHOLHDL 2.7 08/09/2022 1400   LDLCALC 98 08/09/2022 1400      Wt Readings from Last 3 Encounters:  08/17/22 146 lb (66.2 kg)  06/14/22 147 lb 12.8 oz (67 kg)  05/23/22 144 lb 9.6 oz (65.6 kg)      Other studies Reviewed: Additional studies/ records that were reviewed today include: Labs. Review of the above records demonstrates:  Please see  elsewhere in the note.     ASSESSMENT AND PLAN:  Leg swelling: She had some mild leg swelling but this has improved as its not been as hot outside and she is wearing some compression stockings.  Not suggesting heart failure.  She had a phlebitis in her right legs but always swells more than the left.  I am not suggesting further testing for this and would pursue continued conservative management.  CAD: See below  Dyslipidemia: I am going to increase her Lipitor to 40 mg and check a basic metabolic profile in 3 months.  Goal LDL will be less than 70.  Her LDL most recently was 98.  Dyspnea: I do not have a strong suspicion that this is obstructive coronary disease.  I Minna start by screening with a  POET (Plain Old Exercise Treadmill)   Current medicines are reviewed at length with the patient today.  The patient does not have concerns regarding medicines.  The following changes have been made:  no change  Labs/ tests ordered today include:   Orders Placed This Encounter  Procedures   US Carotid Bilateral   Lipid panel   EXERCISE TOLERANCE TEST (ETT)   EKG 12-Lead     Disposition:   FU with me in one year or sooner if her shortness of breath continues.   Signed, Minus Breeding, MD  08/17/2022 4:17 PM    [place]

## 2022-08-17 ENCOUNTER — Other Ambulatory Visit: Payer: Self-pay | Admitting: *Deleted

## 2022-08-17 ENCOUNTER — Encounter: Payer: Self-pay | Admitting: Cardiology

## 2022-08-17 ENCOUNTER — Ambulatory Visit: Payer: Medicare HMO | Admitting: Cardiology

## 2022-08-17 VITALS — BP 130/64 | HR 70 | Ht 60.0 in | Wt 146.0 lb

## 2022-08-17 DIAGNOSIS — R0989 Other specified symptoms and signs involving the circulatory and respiratory systems: Secondary | ICD-10-CM

## 2022-08-17 DIAGNOSIS — E7800 Pure hypercholesterolemia, unspecified: Secondary | ICD-10-CM

## 2022-08-17 DIAGNOSIS — Z79899 Other long term (current) drug therapy: Secondary | ICD-10-CM

## 2022-08-17 DIAGNOSIS — R0602 Shortness of breath: Secondary | ICD-10-CM

## 2022-08-17 DIAGNOSIS — I2510 Atherosclerotic heart disease of native coronary artery without angina pectoris: Secondary | ICD-10-CM | POA: Diagnosis not present

## 2022-08-17 DIAGNOSIS — M7989 Other specified soft tissue disorders: Secondary | ICD-10-CM | POA: Diagnosis not present

## 2022-08-17 MED ORDER — ATORVASTATIN CALCIUM 40 MG PO TABS: 40.0000 mg | ORAL_TABLET | Freq: Every day | ORAL | 3 refills | Status: DC

## 2022-08-17 NOTE — Patient Instructions (Signed)
Medication Instructions:  Please increase your Atorvastatin to 40 mg a day. Continue all other medications as listed.  *If you need a refill on your cardiac medications before your next appointment, please call your pharmacy*   Lab Work: Please have blood work in 3 months - [place]  If you have labs (blood work) drawn today and your tests are completely normal, you will receive your results only by: [place] (if you have MyChart) OR A paper copy in the mail If you have any lab test that is abnormal or we need to change your treatment, we will call you to review the results.   Testing/Procedures: Your physician has requested that you have a carotid duplex. This test is an ultrasound of the carotid arteries in your neck. It looks at blood flow through these arteries that supply the brain with blood. Allow one hour for this exam. There are no restrictions or special instructions. This will be completed at [facility] and you will be contacted to be scheduled.  Your physician has requested that you have an exercise tolerance test. This will be completed at [facility].  You will be contacted to be scheduled.  The day of your treadmill please check in at the main entrance of [facility].  Nothing to eat or drink after midnight.  You may take your medications with sips of water.  Hold your Furosemide this morning.   Follow-Up: At [facility], you and your health needs are our priority.  As part of our continuing mission to provide you with exceptional heart care, we have created designated Provider Care Teams.  These Care Teams include your primary Cardiologist (physician) and Advanced Practice Providers (APPs -  Physician Assistants and Nurse Practitioners) who all work together to provide you with the care you need, when you need it.  We recommend signing up for the patient portal called "MyChart".  Sign up information is provided on this After Visit Summary.   MyChart is used to connect with patients for Virtual Visits (Telemedicine).  Patients are able to view lab/test results, encounter notes, upcoming appointments, etc.  Non-urgent messages can be sent to your provider as well.   To learn more about what you can do with MyChart, go to [email or web address].    Your next appointment:   3 month(s)  The format for your next appointment:   In Person  Provider:   Minus Breeding, MD     Important Information About [place]

## 2022-08-18 ENCOUNTER — Telehealth: Payer: Self-pay | Admitting: Cardiology

## 2022-08-18 NOTE — Telephone Encounter (Signed)
Checking percert on the following patient for testing scheduled at [facility].     GXT  [place] CARTOID   08/23/2022

## 2022-08-23 ENCOUNTER — Ambulatory Visit (HOSPITAL_COMMUNITY)
Admission: RE | Admit: 2022-08-23 | Discharge: 2022-08-23 | Disposition: A | Discharge status: Home / Self Care | Payer: Medicare HMO | Source: Ambulatory Visit | Attending: Cardiology | Admitting: Cardiology

## 2022-08-23 DIAGNOSIS — I2510 Atherosclerotic heart disease of native coronary artery without angina pectoris: Secondary | ICD-10-CM | POA: Diagnosis present

## 2022-08-23 DIAGNOSIS — R0989 Other specified symptoms and signs involving the circulatory and respiratory systems: Secondary | ICD-10-CM | POA: Diagnosis present

## 2022-08-23 LAB — EXERCISE TOLERANCE TEST
Angina Index: 0
Duke Treadmill Score: 3
Exercise duration (min): 3 min
Exercise duration (sec): 2 s
Peak HR: 131 {beats}/min
RPE: 13
Rest HR: 65 {beats}/min
ST Depression (mm): 0 mm

## 2022-09-15 ENCOUNTER — Other Ambulatory Visit: Payer: Self-pay | Admitting: Family Medicine

## 2022-11-02 ENCOUNTER — Other Ambulatory Visit: Payer: Self-pay | Admitting: Family Medicine

## 2022-11-02 DIAGNOSIS — I10 Essential (primary) hypertension: Secondary | ICD-10-CM

## 2022-11-15 ENCOUNTER — Other Ambulatory Visit: Payer: Medicare HMO

## 2022-11-15 IMAGING — MG MM DIGITAL SCREENING BILAT W/ TOMO AND CAD
8 series · 8 of 24 positions shown · non-contrast
Comparison: Previous exam(s).

CLINICAL DATA: Screening.

EXAM:
DIGITAL SCREENING BILATERAL MAMMOGRAM WITH TOMOSYNTHESIS AND CAD
TECHNIQUE: Bilateral screening digital craniocaudal and mediolateral oblique
mammograms were obtained. Bilateral screening digital breast
tomosynthesis was performed. The images were evaluated with
computer-aided detection.

[R MLO synth-2D]
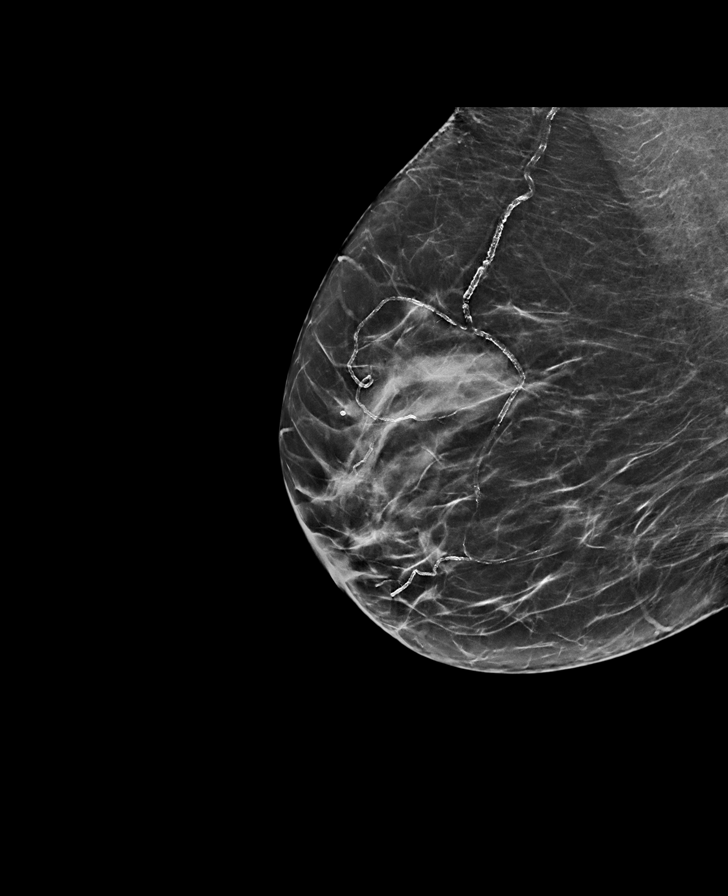

[L CC synth-2D]
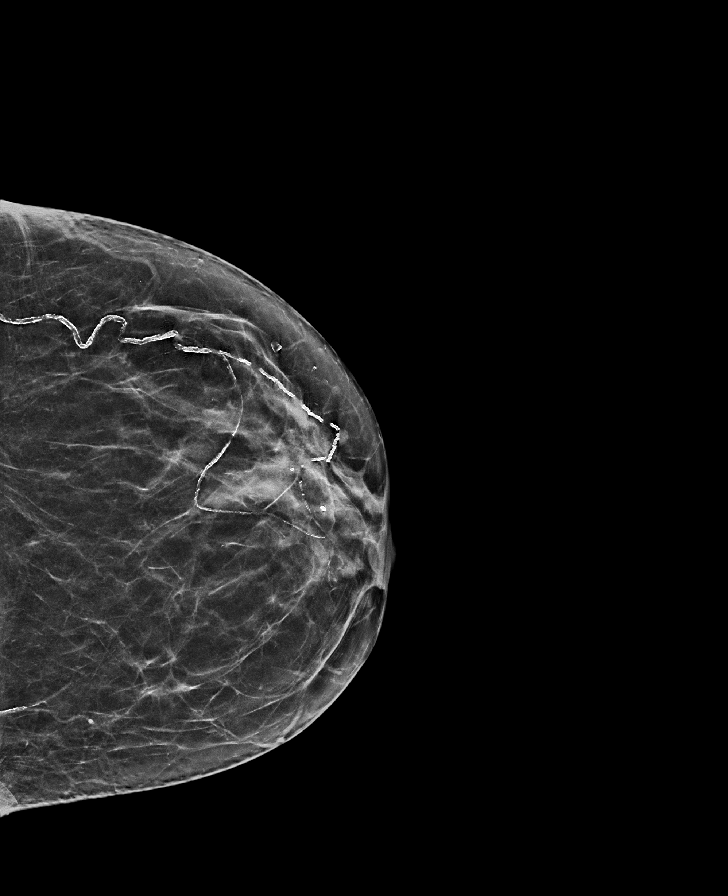

[R CC synth-2D]
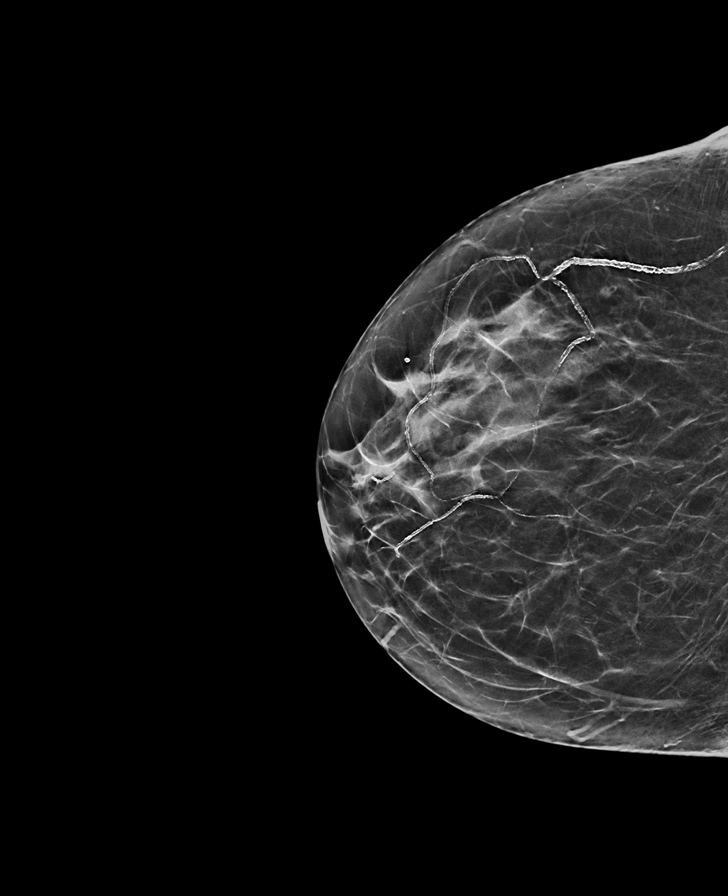

[L MLO synth-2D]
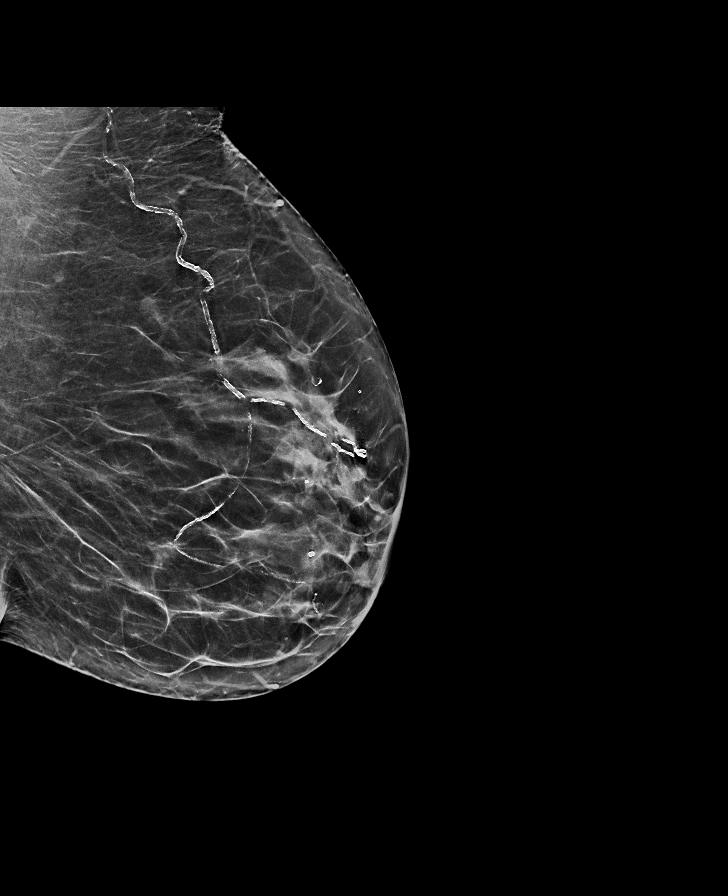

[L CC tomo · tomo slice 31/60.0]
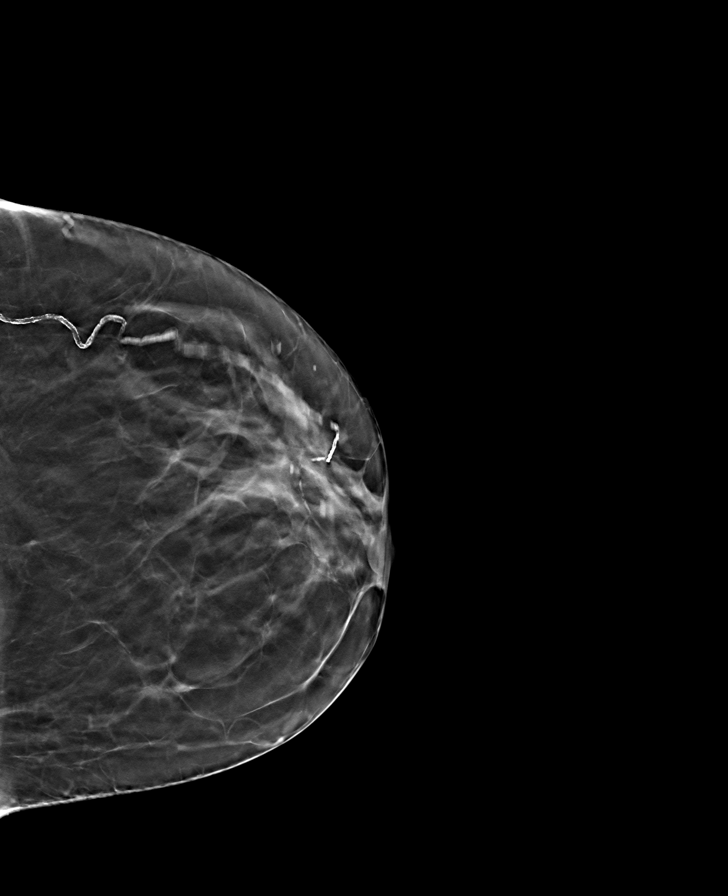

[R CC tomo · tomo slice 31/61.0]
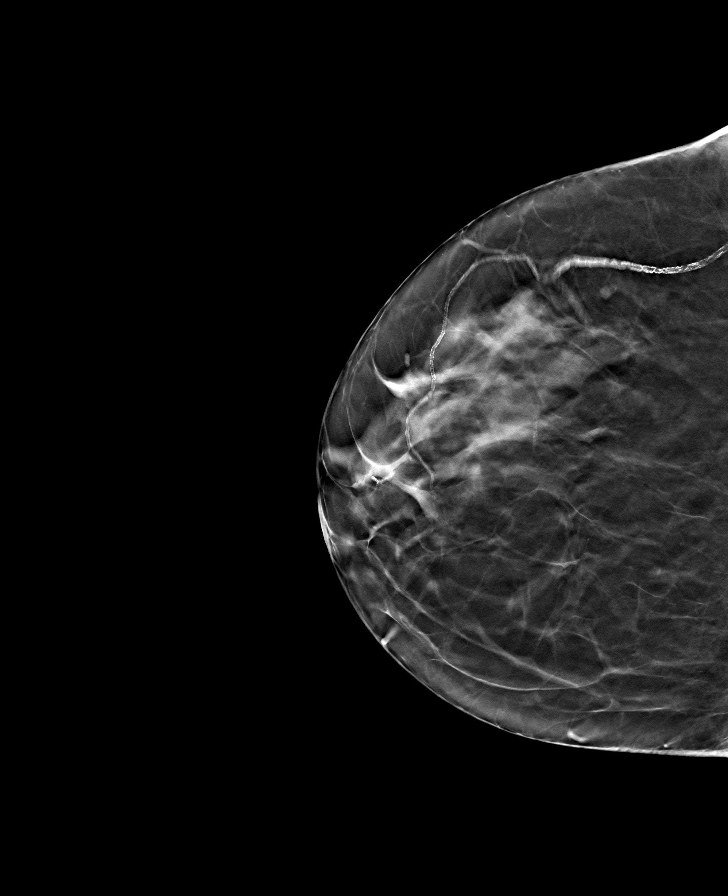

[R MLO tomo · tomo slice 35/68.0]
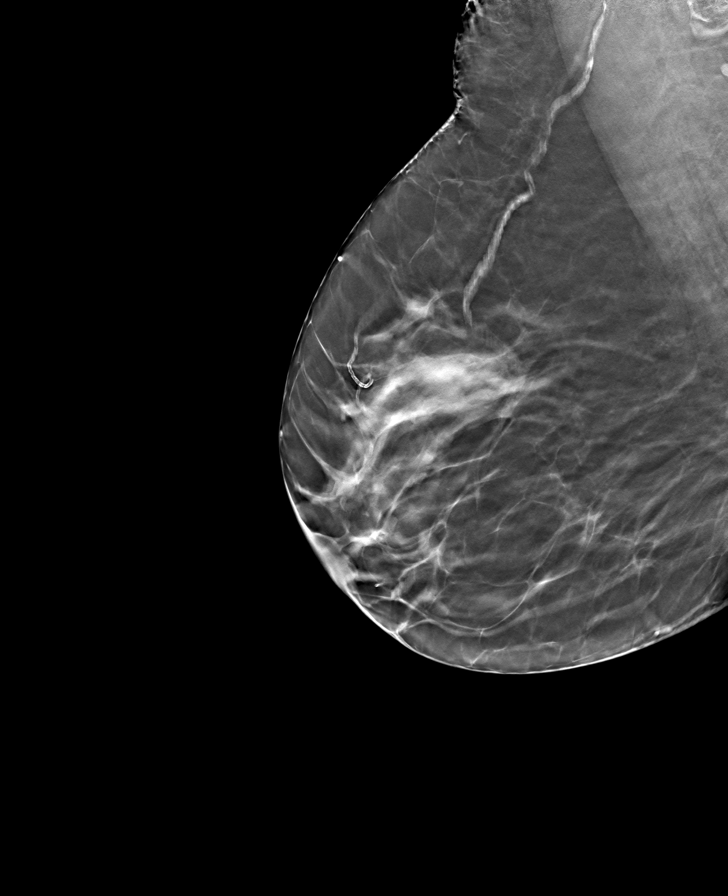

[L MLO tomo · tomo slice 34/67.0]
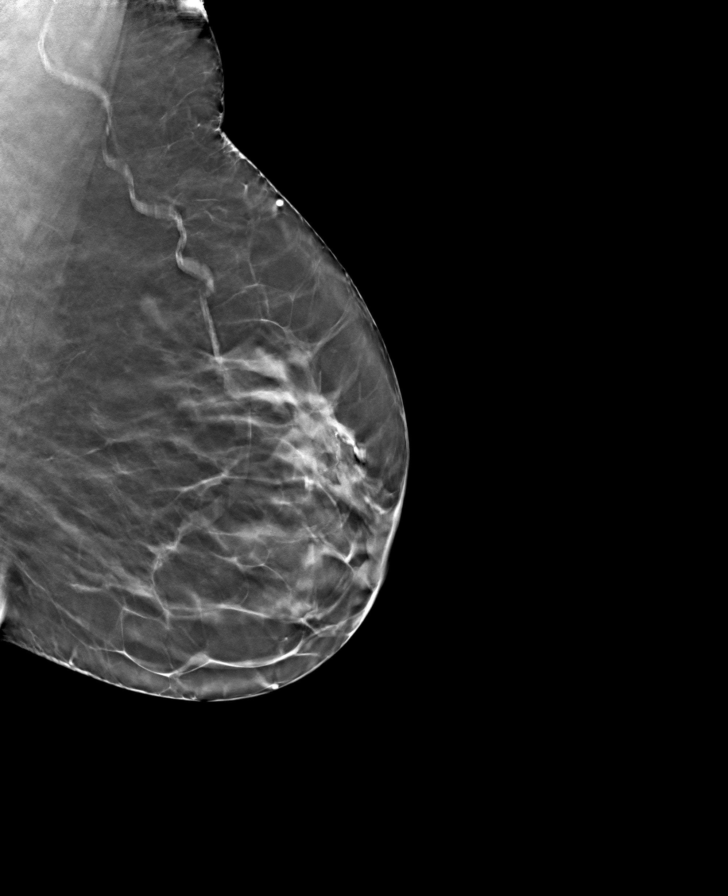

[8 of 24 positions shown; findings below may reference images not displayed]

ACR Breast Density Category b: There are scattered areas of
fibroglandular density.
FINDINGS: There are no findings suspicious for malignancy.
IMPRESSION: No mammographic evidence of malignancy. A result letter of this
screening mammogram will be mailed directly to the patient.

RECOMMENDATION:
Screening mammogram in one year. (Code:51-O-LD2)

BI-RADS CATEGORY  1: Negative.

## 2022-11-16 LAB — LIPID PANEL
Chol/HDL Ratio: 2.7 ratio (ref 0.0–4.4)
Cholesterol, Total: 181 mg/dL (ref 100–199)
HDL: 66 mg/dL (ref >39)
LDL Chol Calc (NIH): 100 mg/dL — ABNORMAL HIGH (ref 0–99)
Triglycerides: 80 mg/dL (ref 0–149)
VLDL Cholesterol Cal: 15 mg/dL (ref 5–40)

## 2022-11-20 DIAGNOSIS — E785 Hyperlipidemia, unspecified: Secondary | ICD-10-CM

## 2022-11-20 DIAGNOSIS — R0602 Shortness of breath: Secondary | ICD-10-CM | POA: Insufficient documentation

## 2022-11-20 HISTORY — DX: Hyperlipidemia, unspecified: E78.5

## 2022-11-20 NOTE — Progress Notes (Unsigned)
Cardiology Office Note   Date:  11/20/2022   ID:  Ashley [name], DOB [birth year], MRN [id number]  PCP:  Janora Norlander, DO  Cardiologist:   None Referring:  Janora Norlander, DO  No chief complaint on file.     History of Present Illness: Ashley [name] is a 77 y.o. female who presents for follow up of CAD.    She is status post stenting of the proximal LAD and first obtuse marginal vessels in February 2011 with bare-metal stents. She had a normal stress Echo in 3/15.  She was seeing Dr. Martinique.   ***  ***  She is moving her care here as she lives in this area.  She reports that she has been getting occasional shortness of breath.  This happens when she walks across her yard or to her mailbox which might be less than 100 yards.  This is not happening every time.  She recovers quickly after a minute or so.  She is not describing any of the chest discomfort or left arm discomfort that was her previous angina.  She is not having any new presyncope or syncope.  She is not having any new weight gain or edema.  She does not think this was similar to the symptoms that she had at the time of her stent.  She does remain active and she does line dancing without bringing on any symptoms.  She is not having any resting shortness of breath, PND or orthopnea.  She has had some palpitations.  These have been sporadic.  She feels a flipping and flopping.  These are occurring at random and not precipitated by activities or anything that she can identify.  She is not having any sustained symptomatic dysrhythmias.     Past Medical History:  Diagnosis Date   Coronary artery disease 01/08/2010   STATUS POST STENTING OF THE LAD AND THE FIRST OBTUSE MARGINAL VESSEL   Diverticulitis    Headache(784.0)    Herpes zoster    Hyperlipidemia    Phlebitis Right   Leg   PONV (postoperative nausea and vomiting)    Stomach ulcer     Past Surgical History:  Procedure Laterality Date   ABDOMINAL  HYSTERECTOMY     with bladder tack   BREAST BIOPSY Left    Normal   Bunionectomy Right    CARDIAC CATHETERIZATION  01/08/10   CARDIAC CATHETERIZATION  01/05/10   NORMAL LEFT VENTRICULAR SIZE AND NORMAL SYSTOLIC FUNCTION. EF IS 60%.    CATARACT EXTRACTION     COLONOSCOPY  07/13/2011   RMR:Lax anal sphincter tone otherwise normal/left sided diverticula, polyp removed but not analyzed   COLONOSCOPY N/A 09/21/2016   Procedure: COLONOSCOPY;  Surgeon: Daneil Dolin, MD;  Location: [facility];  Service: Endoscopy;  Laterality: N/A;  1:00 pm   CORONARY STENT PLACEMENT     VAGINAL HYSTERECTOMY     VEIN LIGATION       Current Outpatient Medications  Medication Sig Dispense Refill   albuterol (VENTOLIN HFA) 108 (90 Base) MCG/ACT inhaler Inhale 2 puffs into the lungs every 6 (six) hours as needed for wheezing or shortness of breath. 8 g 2   amLODipine (NORVASC) 2.5 MG tablet TAKE ONE TABLET DAILY 90 tablet 0   aspirin EC 81 MG tablet Take 81 mg by mouth at bedtime.     atorvastatin (LIPITOR) 40 MG tablet Take 1 tablet (40 mg total) by mouth daily. 90 tablet 3  Calcium Carb-Cholecalciferol (OS-CAL CALCIUM + D3 PO) Take 1 tablet by mouth daily. Calcium 1200 mg with Vitamin D     cetirizine (ZYRTEC) 10 MG tablet Take 10 mg by mouth daily.     cholecalciferol (VITAMIN D) 1000 units tablet Take 1,000 Units by mouth daily.     furosemide (LASIX) 20 MG tablet TAKE ONE TABLET DAILY 90 tablet 0   Omega-3 Fatty Acids (FISH OIL) 1200 MG CAPS Take 1 capsule by mouth every morning.     Psyllium (METAMUCIL PO) Take one (1) teaspoon by mouth daily at bedtime.     pyridoxine (B-6) 200 MG tablet Take 200 mg daily by mouth.     vitamin B-12 (CYANOCOBALAMIN) 1000 MCG tablet Take 1,000 mcg by mouth daily.     No current facility-administered medications for this visit.    Allergies:   Sulfa antibiotics, Demerol, Lisinopril, Moviprep [peg-kcl-nacl-nasulf-na asc-c], and Declomycin [demeclocycline]    ROS:   Please see the history of present illness.   Otherwise, review of systems are positive for ***.   All other systems are reviewed and negative.    PHYSICAL EXAM: VS:  There were no vitals taken for this visit. , BMI There is no height or weight on file to calculate BMI. GENERAL:  Well appearing NECK:  No jugular venous distention, waveform within normal limits, carotid upstroke brisk and symmetric, no bruits, no thyromegaly LUNGS:  Clear to auscultation bilaterally CHEST:  Unremarkable HEART:  PMI not displaced or sustained,S1 and S2 within normal limits, no S3, no S4, no clicks, no rubs, *** murmurs ABD:  Flat, positive bowel sounds normal in frequency in pitch, no bruits, no rebound, no guarding, no midline pulsatile mass, no hepatomegaly, no splenomegaly EXT:  2 plus pulses throughout, no edema, no cyanosis no clubbing    ***GENERAL:  Well appearing HEENT:  Pupils equal round and reactive, fundi not visualized, oral mucosa unremarkable NECK:  No jugular venous distention, waveform within normal limits, carotid upstroke brisk and symmetric, left carotid bruits, no thyromegaly LYMPHATICS:  No cervical, inguinal adenopathy LUNGS:  Clear to auscultation bilaterally BACK:  No CVA tenderness CHEST:  Unremarkable HEART:  PMI not displaced or sustained,S1 and S2 within normal limits, no S3, no S4, no clicks, no rubs, no murmurs ABD:  Flat, positive bowel sounds normal in frequency in pitch, no bruits, no rebound, no guarding, no midline pulsatile mass, no hepatomegaly, no splenomegaly EXT:  2 plus pulses throughout, no edema, no cyanosis no clubbing SKIN:  No rashes no nodules NEURO:  Cranial nerves II through XII grossly intact, motor grossly intact throughout PSYCH:  Cognitively intact, oriented to person place and time    EKG:  EKG is *** ordered today. The ekg ordered today demonstrates sinus rhythm, rate ***, axis within normal limits, intervals within normal limits, no acute ST-T wave  changes.   Recent Labs: 08/09/2022: ALT 30; BUN 14; Creatinine, Ser 0.84; Potassium 4.1; Sodium 142    Lipid Panel    Component Value Date/Time   CHOL 181 11/15/2022 1559   TRIG 80 11/15/2022 1559   HDL 66 11/15/2022 1559   CHOLHDL 2.7 11/15/2022 1559   LDLCALC 100 (H) 11/15/2022 1559      Wt Readings from Last 3 Encounters:  08/17/22 146 lb (66.2 kg)  06/14/22 147 lb 12.8 oz (67 kg)  05/23/22 144 lb 9.6 oz (65.6 kg)      Other studies Reviewed: Additional studies/ records that were reviewed today include: ***. Review of the  above records demonstrates:  Please see elsewhere in the note.     ASSESSMENT AND PLAN:  Leg swelling:  ***  She had some mild leg swelling but this has improved as its not been as hot outside and she is wearing some compression stockings.  Not suggesting heart failure.  She had a phlebitis in her right legs but always swells more than the left.  I am not suggesting further testing for this and would pursue continued conservative management.  CAD:  ***  See below  Dyslipidemia: ***  I am going to increase her Lipitor to 40 mg and check a basic metabolic profile in 3 months.  Goal LDL will be less than 70.  Her LDL most recently was 98.  Dyspnea:   She had a negative POET (Plain Old Exercise Treadmill).  ***  I do not have a strong suspicion that this is obstructive coronary disease.  I Minna start by screening with a  POET (Plain Old Exercise Treadmill)   Current medicines are reviewed at length with the patient today.  The patient does not have concerns regarding medicines.  The following changes have been made:  ***  Labs/ tests ordered today include: ***  No orders of the defined types were placed in this encounter.    Disposition:   FU with me in ***  Signed, Minus Breeding, MD  11/20/2022 12:14 PM    [place]

## 2022-11-23 ENCOUNTER — Ambulatory Visit: Payer: Medicare HMO | Admitting: Cardiology

## 2022-11-23 ENCOUNTER — Other Ambulatory Visit: Payer: Self-pay | Admitting: *Deleted

## 2022-11-23 ENCOUNTER — Encounter: Payer: Self-pay | Admitting: Cardiology

## 2022-11-23 VITALS — BP 144/70 | HR 80 | Ht 60.0 in | Wt 148.0 lb

## 2022-11-23 DIAGNOSIS — R0602 Shortness of breath: Secondary | ICD-10-CM

## 2022-11-23 DIAGNOSIS — E785 Hyperlipidemia, unspecified: Secondary | ICD-10-CM

## 2022-11-23 DIAGNOSIS — I2510 Atherosclerotic heart disease of native coronary artery without angina pectoris: Secondary | ICD-10-CM | POA: Diagnosis not present

## 2022-11-23 DIAGNOSIS — Z79899 Other long term (current) drug therapy: Secondary | ICD-10-CM

## 2022-11-23 DIAGNOSIS — M7989 Other specified soft tissue disorders: Secondary | ICD-10-CM

## 2022-11-23 DIAGNOSIS — I2583 Coronary atherosclerosis due to lipid rich plaque: Secondary | ICD-10-CM

## 2022-11-23 MED ORDER — ROSUVASTATIN CALCIUM 40 MG PO TABS: 40.0000 mg | ORAL_TABLET | Freq: Every day | ORAL | 3 refills | Status: DC

## 2022-11-23 NOTE — Patient Instructions (Signed)
Medication Instructions:  Please discontinue your Atorvastatin and start Crestor 40 mg once daily. Continue all other medications as listed.  *If you need a refill on your cardiac medications before your next appointment, please call your pharmacy*   Lab Work: Please have Lipid panel in 3 months (around 02/22/23)  If you have labs (blood work) drawn today and your tests are completely normal, you will receive your results only by: [place] (if you have MyChart) OR A paper copy in the mail If you have any lab test that is abnormal or we need to change your treatment, we will call you to review the results.   Follow-Up: At [facility], you and your health needs are our priority.  As part of our continuing mission to provide you with exceptional heart care, we have created designated Provider Care Teams.  These Care Teams include your primary Cardiologist (physician) and Advanced Practice Providers (APPs -  Physician Assistants and Nurse Practitioners) who all work together to provide you with the care you need, when you need it.  We recommend signing up for the patient portal called "MyChart".  Sign up information is provided on this After Visit Summary.  MyChart is used to connect with patients for Virtual Visits (Telemedicine).  Patients are able to view lab/test results, encounter notes, upcoming appointments, etc.  Non-urgent messages can be sent to your provider as well.   To learn more about what you can do with MyChart, go to [email or web address].    Your next appointment:   1 year(s)  The format for your next appointment:   In Person  Provider:   Minus Breeding, MD     Important Information About Sugar

## 2022-11-28 ENCOUNTER — Telehealth (INDEPENDENT_AMBULATORY_CARE_PROVIDER_SITE_OTHER): Payer: Medicare HMO | Admitting: Nurse Practitioner

## 2022-11-28 ENCOUNTER — Ambulatory Visit: Payer: Medicare HMO | Admitting: Family Medicine

## 2022-11-28 ENCOUNTER — Encounter: Payer: Self-pay | Admitting: Nurse Practitioner

## 2022-11-28 DIAGNOSIS — U071 COVID-19: Secondary | ICD-10-CM | POA: Diagnosis not present

## 2022-11-28 MED ORDER — MOLNUPIRAVIR EUA 200MG CAPSULE: 4.0000 | ORAL_CAPSULE | Freq: Two times a day (BID) | ORAL | 0 refills | Status: AC

## 2022-11-28 NOTE — Progress Notes (Signed)
Virtual Visit Consent   Ashley [name], you are scheduled for a virtual visit with Mary-Margaret Hassell Done, FNP, a [facility] provider, today.     Just as with appointments in the office, your consent must be obtained to participate.  Your consent will be active for this visit and any virtual visit you may have with one of our providers in the next 365 days.     If you have a MyChart account, a copy of this consent can be sent to you electronically.  All virtual visits are billed to your insurance company just like a traditional visit in the office.    As this is a virtual visit, video technology does not allow for your provider to perform a traditional examination.  This may limit your provider's ability to fully assess your condition.  If your provider identifies any concerns that need to be evaluated in person or the need to arrange testing (such as labs, EKG, etc.), we will make arrangements to do so.     Although advances in technology are sophisticated, we cannot ensure that it will always work on either your end or our end.  If the connection with a video visit is poor, the visit may have to be switched to a telephone visit.  With either a video or telephone visit, we are not always able to ensure that we have a secure connection.     I need to obtain your verbal consent now.   Are you willing to proceed with your visit today? YES   Ashley [name] has provided verbal consent on 11/28/2022 for a virtual visit (video or telephone).   Mary-Margaret Hassell Done, FNP   Date: 11/28/2022 8:28 AM   Virtual Visit via Video Note   I, Mary-Margaret Hassell Done, connected with Ashley [name] ([id number], 1946/09/04) on 11/28/22 at 10:05 AM EST by a video-enabled telemedicine application and verified that I am speaking with the correct person using two identifiers.  Location: Patient: Virtual Visit Location Patient: Home Provider: Virtual Visit Location Provider: Mobile   I discussed the limitations of  evaluation and management by telemedicine and the availability of in person appointments. The patient expressed understanding and agreed to proceed.    History of Present Illness: Ashley [name] is a 77 y.o. who identifies as a female who was assigned female at birth, and is being seen today for covid positive.  HPI: Husband has covid  URI  This is a new problem. Episode onset: friday night. The problem has been waxing and waning. Associated symptoms include congestion, coughing and headaches. Pertinent negatives include no rhinorrhea or sinus pain. She has tried nothing for the symptoms. The treatment provided mild relief.    Review of Systems  HENT:  Positive for congestion. Negative for rhinorrhea and sinus pain.   Respiratory:  Positive for cough.   Neurological:  Positive for headaches.    Problems:  Patient Active Problem List   Diagnosis Date Noted   Dyslipidemia 11/20/2022   SOB (shortness of breath) 11/20/2022   Leg swelling 08/16/2022   Tick bite of left lower leg 06/09/2021   Rash 06/09/2021   Tick bite of right thigh 03/31/2021   Cough 09/29/2020   Left wrist pain 08/31/2020   Essential hypertension 12/11/2018   Grade I internal hemorrhoids 11/10/2016   FH: colon cancer 09/01/2016   Rectal bleeding 09/01/2016   History of colonic polyps 09/01/2016   Symptomatic menopausal or female climacteric states 07/23/2013   Postmenopausal atrophic vaginitis  07/23/2013   Bowel habit changes 07/05/2011   Fecal incontinence 07/05/2011   Headache(784.0)    Herpes zoster    Hypercholesterolemia    Coronary artery disease 01/08/2010    Allergies:  Allergies  Allergen Reactions   Sulfa Antibiotics Swelling    Angoedema   Demerol Nausea And Vomiting   Lisinopril Swelling    angioedema . Uncertain if due to Sulfa or from ACE-I but ACE-I was discontinued to be safe.   Moviprep [Peg-Kcl-Nacl-Nasulf-Na Asc-C] Nausea And Vomiting   Declomycin [Demeclocycline] Rash    Medications:  Current Outpatient Medications:    albuterol (VENTOLIN HFA) 108 (90 Base) MCG/ACT inhaler, Inhale 2 puffs into the lungs every 6 (six) hours as needed for wheezing or shortness of breath., Disp: 8 g, Rfl: 2   amLODipine (NORVASC) 2.5 MG tablet, TAKE ONE TABLET DAILY, Disp: 90 tablet, Rfl: 0   aspirin EC 81 MG tablet, Take 81 mg by mouth at bedtime., Disp: , Rfl:    Calcium Carb-Cholecalciferol (OS-CAL CALCIUM + D3 PO), Take 1 tablet by mouth daily. Calcium 1200 mg with Vitamin D, Disp: , Rfl:    cetirizine (ZYRTEC) 10 MG tablet, Take 10 mg by mouth daily., Disp: , Rfl:    cholecalciferol (VITAMIN D) 1000 units tablet, Take 1,000 Units by mouth daily., Disp: , Rfl:    furosemide (LASIX) 20 MG tablet, TAKE ONE TABLET DAILY, Disp: 90 tablet, Rfl: 0   Omega-3 Fatty Acids (FISH OIL) 1200 MG CAPS, Take 1 capsule by mouth every morning., Disp: , Rfl:    Psyllium (METAMUCIL PO), Take one (1) teaspoon by mouth daily at bedtime., Disp: , Rfl:    pyridoxine (B-6) 200 MG tablet, Take 200 mg daily by mouth., Disp: , Rfl:    rosuvastatin (CRESTOR) 40 MG tablet, Take 1 tablet (40 mg total) by mouth daily., Disp: 90 tablet, Rfl: 3   vitamin B-12 (CYANOCOBALAMIN) 1000 MCG tablet, Take 1,000 mcg by mouth daily., Disp: , Rfl:   Observations/Objective: Patient is well-developed, well-nourished in no acute distress.  Resting comfortably  at home.  Head is normocephalic, atraumatic.  No labored breathing.  Speech is clear and coherent with logical content.  Patient is alert and oriented at baseline.  Raspy voice Dry cough  Assessment and Plan:  Ashley [name] in today with chief complaint of Covid Positive   1. Positive self-administered antigen test for COVID-19 1. Take meds as prescribed 2. Use a cool mist humidifier especially during the winter months and when heat has been humid. 3. Use saline nose sprays frequently 4. Saline irrigations of the nose can be very helpful if done  frequently.  * 4X daily for 1 week*  * Use of a nettie pot can be helpful with this. Follow directions with this* 5. Drink plenty of fluids 6. Keep thermostat turn down low 7.For any cough or congestion- robitussin 8. For fever or aces or pains- take tylenol or ibuprofen appropriate for age and weight.  * for fevers greater than 101 orally you may alternate ibuprofen and tylenol every  3 hours.    - molnupiravir EUA (LAGEVRIO) 200 mg CAPS capsule; Take 4 capsules (800 mg total) by mouth 2 (two) times daily for 5 days.  Dispense: 40 capsule; Refill: 0     Follow Up Instructions: I discussed the assessment and treatment plan with the patient. The patient was provided an opportunity to ask questions and all were answered. The patient agreed with the plan and demonstrated an understanding of  the instructions.  A copy of instructions were sent to the patient via MyChart.  The patient was advised to call back or seek an in-person evaluation if the symptoms worsen or if the condition fails to improve as anticipated.  Time:  I spent 10 minutes with the patient via telehealth technology discussing the above problems/concerns.    Mary-Margaret Hassell Done, FNP

## 2022-11-28 NOTE — Patient Instructions (Signed)
Quarantine and Isolation Quarantine and isolation refer to local and travel restrictions to protect the public and travelers from contagious diseases that constitute a public health threat. Contagious diseases are diseases that can spread from one person to another. Quarantine and isolation help to protect the public by preventing exposure to people who have or may have a contagious disease. Isolation separates people who are sick with a contagious disease from people who are not sick. Quarantine separates and restricts the movement of people who were exposed to a contagious disease to see if they become sick. You may be put in quarantine or isolation if you have been exposed to or diagnosed with any of the following diseases: Severe acute respiratory syndromes, such as COVID-19. Cholera. Diphtheria. Tuberculosis. Plague. Smallpox. Yellow fever. Viral hemorrhagic fevers, such as [place], Ebola, and [place]. When to quarantine or isolate Follow these rules, whether you have been vaccinated or not: Stay home and isolate from others when you are sick with a contagious disease. Isolate when you test positive for a contagious disease, even if you do not have symptoms. Isolate if you are sick and suspect that you may have a contagious disease. If you suspect that you have a contagious disease, get tested. If your test results are negative, you can end your isolation. If your test results are positive, follow the full isolation recommendations as told by your health care provider or local health authorities. Quarantine and stay away from others when you have been in close contact with someone who has tested positive for a contagious disease. Close contact is defined as being less than 6 ft (1.8 m) away from an infected person for a total of 15 minutes or more over a 24-hour period. Do not go to places where you are unable to wear a mask, such as restaurants and some gyms. Stay home and separate  from others as much as possible. Avoid being around people who may get very sick from the contagious disease that you have. Use a separate bathroom, if possible. Do not travel. For travel guidance, visit the CDC's travel webpage at wwwnc.cdc.gov/travel/ Follow these instructions at home: Medicines  Take over-the-counter and prescription medicines as told by your health care provider. Finish all antibiotic medicine even when you start to feel better. Stay up to date with all your vaccines. Get scheduled vaccines and boosters as recommended by your health care provider. Lifestyle Wear a high-quality mask if you must be around others at home and in public, if recommended. Improve air flow (ventilation) at home to help prevent the disease from spreading to other people, if possible. Do not share personal household items, like cups, towels, and utensils. Practice everyday hygiene and cleaning. General instructions Talk to your health care provider if you have a weakened body defense system (immune system). People with a weakened immune system may have a reduced immune response to vaccines. You may need to follow current prevention measures, including wearing a well-fitting mask, avoiding crowds, and avoiding poorly ventilated indoor places. Monitor symptoms and follow health care provider instructions, which may include resting, drinking fluids, and taking medicines. Follow specific isolation and quarantine recommendations if you are in places that can lead to disease outbreaks, such as correctional and detention facilities, homeless shelters, and cruise ships. Return to your normal activities as told by your health care provider. Ask your health care provider what activities are safe for you. Keep all follow-up visits. This is important. Where to find more information CDC: www.cdc.gov/quarantine/index.html Contact   a health care provider if: You have a fever. You have signs and symptoms that  return or get worse after isolation. Get help right away if: You have difficulty breathing. You have chest pain. These symptoms may be an emergency. Get help right away. Call 911. Do not wait to see if the symptoms will go away. Do not drive yourself to the hospital. Summary Isolation and quarantine help protect the public by preventing exposure to people who have or may have a contagious disease. Isolate when you are sick or when you test positive, even if you do not have symptoms. Quarantine and stay away from others when you have been in close contact with someone who has tested positive for a contagious disease. This information is not intended to replace advice given to you by your health care provider. Make sure you discuss any questions you have with your health care provider. Document Revised: 11/11/2021 Document Reviewed: 10/21/2021 Elsevier Patient Education  2023 Elsevier Inc.  

## 2022-12-16 ENCOUNTER — Other Ambulatory Visit: Payer: Self-pay | Admitting: Family Medicine

## 2023-01-12 ENCOUNTER — Telehealth: Payer: Self-pay | Admitting: Cardiology

## 2023-01-12 ENCOUNTER — Other Ambulatory Visit: Payer: Self-pay | Admitting: Orthopedic Surgery

## 2023-01-12 NOTE — Telephone Encounter (Signed)
   Pre-operative Risk Assessment    Patient Name: Ashley [name]  DOB: 08-15-1946 MRN: [id number]      Request for Surgical Clearance    Procedure:   Left thumb mucoid cyst excision debridement interphalangeal joint    Date of Surgery:  Clearance 01/27/23                                 Surgeon:  Dr. Threasa Beards Group or Practice Name:  [facility]  Phone number:  [phone] Fax number:  [phone]   Type of Clearance Requested:   - Medical  - Pharmacy:  Hold Aspirin     Type of Anesthesia:   choice   Additional requests/questions:    Dorthey Sawyer   01/12/2023, 10:32 AM

## 2023-01-13 NOTE — Telephone Encounter (Signed)
   Patient Name: Ashley [name]  DOB: 1946/03/21 MRN: [id number]  Primary Cardiologist: None  Chart reviewed as part of pre-operative protocol coverage. Pre-op clearance already addressed by colleagues in earlier phone notes. To summarize recommendations:  - OK for surgery.  No further testing.  -Dr. Percival Spanish  She is on ASA for remote stenting of LAD and OM in 2011. Okay to hold for 7 days prior to surgery and restart when medically safe to do so.   Will route this bundled recommendation to requesting provider via Epic fax function and remove from pre-op pool. Please call with questions.  Elgie Collard, PA-C 01/13/2023, 8:01 AM

## 2023-01-19 ENCOUNTER — Ambulatory Visit: Payer: Medicare HMO | Admitting: Orthopaedic Surgery

## 2023-01-20 ENCOUNTER — Other Ambulatory Visit: Payer: Self-pay

## 2023-01-20 ENCOUNTER — Encounter (HOSPITAL_BASED_OUTPATIENT_CLINIC_OR_DEPARTMENT_OTHER): Payer: Self-pay | Admitting: Orthopedic Surgery

## 2023-01-23 ENCOUNTER — Ambulatory Visit: Payer: Medicare HMO | Admitting: Family Medicine

## 2023-01-26 ENCOUNTER — Encounter (HOSPITAL_BASED_OUTPATIENT_CLINIC_OR_DEPARTMENT_OTHER)
Admission: RE | Admit: 2023-01-26 | Discharge: 2023-01-26 | Disposition: A | Discharge status: Home / Self Care | Payer: Medicare HMO | Source: Ambulatory Visit | Attending: Orthopedic Surgery | Admitting: Orthopedic Surgery

## 2023-01-26 ENCOUNTER — Other Ambulatory Visit: Payer: Self-pay | Admitting: Orthopedic Surgery

## 2023-01-26 DIAGNOSIS — Z01812 Encounter for preprocedural laboratory examination: Secondary | ICD-10-CM | POA: Insufficient documentation

## 2023-01-26 DIAGNOSIS — M25842 Other specified joint disorders, left hand: Secondary | ICD-10-CM | POA: Diagnosis present

## 2023-01-26 DIAGNOSIS — Z955 Presence of coronary angioplasty implant and graft: Secondary | ICD-10-CM | POA: Diagnosis not present

## 2023-01-26 DIAGNOSIS — M19042 Primary osteoarthritis, left hand: Secondary | ICD-10-CM | POA: Diagnosis not present

## 2023-01-26 DIAGNOSIS — I2510 Atherosclerotic heart disease of native coronary artery without angina pectoris: Secondary | ICD-10-CM | POA: Diagnosis not present

## 2023-01-26 DIAGNOSIS — M25742 Osteophyte, left hand: Secondary | ICD-10-CM | POA: Diagnosis not present

## 2023-01-26 DIAGNOSIS — I10 Essential (primary) hypertension: Secondary | ICD-10-CM | POA: Diagnosis not present

## 2023-01-26 DIAGNOSIS — Z8711 Personal history of peptic ulcer disease: Secondary | ICD-10-CM | POA: Diagnosis not present

## 2023-01-26 LAB — BASIC METABOLIC PANEL
Anion gap: 7 (ref 5–15)
BUN: 15 mg/dL (ref 8–23)
CO2: 29 mmol/L (ref 22–32)
Calcium: 8.9 mg/dL (ref 8.9–10.3)
Chloride: 102 mmol/L (ref 98–111)
Creatinine, Ser: 0.82 mg/dL (ref 0.44–1.00)
GFR, Estimated: >60 mL/min (ref >60)
Glucose, Bld: 98 mg/dL (ref 70–99)
Potassium: 3.6 mmol/L (ref 3.5–5.1)
Sodium: 138 mmol/L (ref 135–145)

## 2023-01-26 NOTE — Progress Notes (Signed)

## 2023-01-27 ENCOUNTER — Encounter (HOSPITAL_BASED_OUTPATIENT_CLINIC_OR_DEPARTMENT_OTHER)
Admission: RE | Disposition: A | Discharge status: Home / Self Care | Payer: Self-pay | Source: Home / Self Care | Attending: Orthopedic Surgery

## 2023-01-27 ENCOUNTER — Ambulatory Visit (HOSPITAL_BASED_OUTPATIENT_CLINIC_OR_DEPARTMENT_OTHER): Payer: Medicare HMO | Admitting: Anesthesiology

## 2023-01-27 ENCOUNTER — Ambulatory Visit (HOSPITAL_BASED_OUTPATIENT_CLINIC_OR_DEPARTMENT_OTHER)
Admission: RE | Admit: 2023-01-27 | Discharge: 2023-01-27 | Disposition: A | Discharge status: Home / Self Care | Payer: Medicare HMO | Attending: Orthopedic Surgery | Admitting: Orthopedic Surgery

## 2023-01-27 ENCOUNTER — Encounter (HOSPITAL_BASED_OUTPATIENT_CLINIC_OR_DEPARTMENT_OTHER): Payer: Self-pay | Admitting: Orthopedic Surgery

## 2023-01-27 DIAGNOSIS — I2510 Atherosclerotic heart disease of native coronary artery without angina pectoris: Secondary | ICD-10-CM | POA: Insufficient documentation

## 2023-01-27 DIAGNOSIS — M25842 Other specified joint disorders, left hand: Secondary | ICD-10-CM | POA: Insufficient documentation

## 2023-01-27 DIAGNOSIS — M19042 Primary osteoarthritis, left hand: Secondary | ICD-10-CM

## 2023-01-27 DIAGNOSIS — Z955 Presence of coronary angioplasty implant and graft: Secondary | ICD-10-CM | POA: Insufficient documentation

## 2023-01-27 DIAGNOSIS — I10 Essential (primary) hypertension: Secondary | ICD-10-CM | POA: Diagnosis not present

## 2023-01-27 DIAGNOSIS — Z8711 Personal history of peptic ulcer disease: Secondary | ICD-10-CM | POA: Insufficient documentation

## 2023-01-27 DIAGNOSIS — L729 Follicular cyst of the skin and subcutaneous tissue, unspecified: Secondary | ICD-10-CM | POA: Diagnosis not present

## 2023-01-27 DIAGNOSIS — Z01818 Encounter for other preprocedural examination: Secondary | ICD-10-CM

## 2023-01-27 DIAGNOSIS — M25742 Osteophyte, left hand: Secondary | ICD-10-CM | POA: Insufficient documentation

## 2023-01-27 HISTORY — DX: Essential (primary) hypertension: I10

## 2023-01-27 HISTORY — PX: CYST EXCISION: SHX5701

## 2023-01-27 SURGERY — CYST REMOVAL
Anesthesia: General | Site: Thumb | Laterality: Left

## 2023-01-27 MED ORDER — CEFAZOLIN SODIUM-DEXTROSE 2-4 GM/100ML-% IV SOLN
2.0000 g | INTRAVENOUS | Status: AC
  Administered 2023-01-27: 2 g via INTRAVENOUS

## 2023-01-27 MED ORDER — PROPOFOL 500 MG/50ML IV EMUL
INTRAVENOUS | Status: AC
  Filled 2023-01-27: qty 50

## 2023-01-27 MED ORDER — DEXAMETHASONE SODIUM PHOSPHATE 10 MG/ML IJ SOLN
INTRAMUSCULAR | Status: AC
  Filled 2023-01-27: qty 1

## 2023-01-27 MED ORDER — FENTANYL CITRATE (PF) 100 MCG/2ML IJ SOLN: 25.0000 ug | INTRAMUSCULAR | Status: DC | PRN

## 2023-01-27 MED ORDER — DEXAMETHASONE SODIUM PHOSPHATE 4 MG/ML IJ SOLN
INTRAMUSCULAR | Status: DC | PRN
  Administered 2023-01-27: 5 mg via INTRAVENOUS

## 2023-01-27 MED ORDER — FENTANYL CITRATE (PF) 100 MCG/2ML IJ SOLN
INTRAMUSCULAR | Status: AC
  Filled 2023-01-27: qty 2

## 2023-01-27 MED ORDER — FENTANYL CITRATE (PF) 100 MCG/2ML IJ SOLN
INTRAMUSCULAR | Status: DC | PRN
  Administered 2023-01-27 (×2): 25 ug via INTRAVENOUS

## 2023-01-27 MED ORDER — LIDOCAINE 2% (20 MG/ML) 5 ML SYRINGE
INTRAMUSCULAR | Status: AC
  Filled 2023-01-27: qty 5

## 2023-01-27 MED ORDER — PROPOFOL 10 MG/ML IV BOLUS
INTRAVENOUS | Status: DC | PRN
  Administered 2023-01-27: 100 mg via INTRAVENOUS

## 2023-01-27 MED ORDER — ONDANSETRON HCL 4 MG/2ML IJ SOLN
INTRAMUSCULAR | Status: AC
  Filled 2023-01-27: qty 2

## 2023-01-27 MED ORDER — 0.9 % SODIUM CHLORIDE (POUR BTL) OPTIME
TOPICAL | Status: DC | PRN
  Administered 2023-01-27: 100 mL

## 2023-01-27 MED ORDER — PHENYLEPHRINE HCL (PRESSORS) 10 MG/ML IV SOLN
INTRAVENOUS | Status: DC | PRN
  Administered 2023-01-27 (×3): 80 ug via INTRAVENOUS

## 2023-01-27 MED ORDER — BUPIVACAINE HCL (PF) 0.25 % IJ SOLN
INTRAMUSCULAR | Status: AC
  Filled 2023-01-27: qty 30

## 2023-01-27 MED ORDER — LIDOCAINE HCL (CARDIAC) PF 100 MG/5ML IV SOSY
PREFILLED_SYRINGE | INTRAVENOUS | Status: DC | PRN
  Administered 2023-01-27: 50 mg via INTRAVENOUS

## 2023-01-27 MED ORDER — ACETAMINOPHEN 500 MG PO TABS
1000.0000 mg | ORAL_TABLET | Freq: Once | ORAL | Status: AC
  Administered 2023-01-27: 1000 mg via ORAL

## 2023-01-27 MED ORDER — ONDANSETRON HCL 4 MG/2ML IJ SOLN
INTRAMUSCULAR | Status: DC | PRN
  Administered 2023-01-27: 4 mg via INTRAVENOUS

## 2023-01-27 MED ORDER — BUPIVACAINE HCL (PF) 0.25 % IJ SOLN
INTRAMUSCULAR | Status: DC | PRN
  Administered 2023-01-27: 9 mL

## 2023-01-27 MED ORDER — LACTATED RINGERS IV SOLN: INTRAVENOUS | Status: DC

## 2023-01-27 MED ORDER — TRAMADOL HCL 50 MG PO TABS: ORAL_TABLET | ORAL | 0 refills | Status: DC

## 2023-01-27 MED ORDER — CEFAZOLIN SODIUM-DEXTROSE 2-4 GM/100ML-% IV SOLN
INTRAVENOUS | Status: AC
  Filled 2023-01-27: qty 100

## 2023-01-27 MED ORDER — ACETAMINOPHEN 500 MG PO TABS
ORAL_TABLET | ORAL | Status: AC
  Filled 2023-01-27: qty 2

## 2023-01-27 MED ORDER — PROPOFOL 500 MG/50ML IV EMUL
INTRAVENOUS | Status: DC | PRN
  Administered 2023-01-27: 100 ug/kg/min via INTRAVENOUS

## 2023-01-27 MED ORDER — ONDANSETRON HCL 4 MG/2ML IJ SOLN: 4.0000 mg | Freq: Once | INTRAMUSCULAR | Status: DC | PRN

## 2023-01-27 MED ORDER — PHENYLEPHRINE 80 MCG/ML (10ML) SYRINGE FOR IV PUSH (FOR BLOOD PRESSURE SUPPORT)
PREFILLED_SYRINGE | INTRAVENOUS | Status: AC
  Filled 2023-01-27: qty 10

## 2023-01-27 SURGICAL SUPPLY — 55 items
APL PRP STRL LF DISP 70% ISPRP (MISCELLANEOUS) ×2
APL SKNCLS STERI-STRIP NONHPOA (GAUZE/BANDAGES/DRESSINGS)
BANDAGE GAUZE 1X75IN STRL (MISCELLANEOUS) IMPLANT
BENZOIN TINCTURE PRP APPL 2/3 (GAUZE/BANDAGES/DRESSINGS) IMPLANT
BLADE MINI RND TIP GREEN BEAV (BLADE) IMPLANT
BLADE SURG 15 STRL LF DISP TIS (BLADE) ×4 IMPLANT
BLADE SURG 15 STRL SS (BLADE) ×4
BNDG CMPR 5X2 CHSV 1 LYR STRL (GAUZE/BANDAGES/DRESSINGS)
BNDG CMPR 5X2 KNTD ELC UNQ LF (GAUZE/BANDAGES/DRESSINGS)
BNDG CMPR 5X3 KNIT ELC UNQ LF (GAUZE/BANDAGES/DRESSINGS)
BNDG CMPR 75X11 PLY HI ABS (MISCELLANEOUS)
BNDG CMPR 75X21 PLY HI ABS (MISCELLANEOUS)
BNDG CMPR 9X4 STRL LF SNTH (GAUZE/BANDAGES/DRESSINGS)
BNDG COHESIVE 1X5 TAN STRL LF (GAUZE/BANDAGES/DRESSINGS) IMPLANT
BNDG COHESIVE 2X5 TAN ST LF (GAUZE/BANDAGES/DRESSINGS) IMPLANT
BNDG ELASTIC 2INX 5YD STR LF (GAUZE/BANDAGES/DRESSINGS) IMPLANT
BNDG ELASTIC 3INX 5YD STR LF (GAUZE/BANDAGES/DRESSINGS) IMPLANT
BNDG ESMARK 4X9 LF (GAUZE/BANDAGES/DRESSINGS) IMPLANT
BNDG GAUZE 1X75IN STRL (MISCELLANEOUS)
BNDG GAUZE DERMACEA FLUFF 4 (GAUZE/BANDAGES/DRESSINGS) IMPLANT
BNDG GZE DERMACEA 4 6PLY (GAUZE/BANDAGES/DRESSINGS)
BNDG PLASTER X FAST 3X3 WHT LF (CAST SUPPLIES) IMPLANT
BNDG PLSTR 9X3 FST ST WHT (CAST SUPPLIES)
CHLORAPREP W/TINT 26 (MISCELLANEOUS) ×2 IMPLANT
CORD BIPOLAR FORCEPS 12FT (ELECTRODE) ×2 IMPLANT
COVER BACK TABLE 60X90IN (DRAPES) ×2 IMPLANT
COVER MAYO STAND STRL (DRAPES) ×2 IMPLANT
CUFF TOURN SGL QUICK 18X4 (TOURNIQUET CUFF) ×2 IMPLANT
DRAPE EXTREMITY T 121X128X90 (DISPOSABLE) ×2 IMPLANT
DRAPE SURG 17X23 STRL (DRAPES) ×2 IMPLANT
GAUZE SPONGE 4X4 12PLY STRL (GAUZE/BANDAGES/DRESSINGS) ×2 IMPLANT
GAUZE STRETCH 2X75IN STRL (MISCELLANEOUS) IMPLANT
GAUZE XEROFORM 1X8 LF (GAUZE/BANDAGES/DRESSINGS) ×2 IMPLANT
GLOVE BIO SURGEON STRL SZ7.5 (GLOVE) ×2 IMPLANT
GLOVE BIOGEL PI IND STRL 8 (GLOVE) ×2 IMPLANT
GOWN STRL REUS W/ TWL LRG LVL3 (GOWN DISPOSABLE) ×2 IMPLANT
GOWN STRL REUS W/TWL LRG LVL3 (GOWN DISPOSABLE) ×2
NDL HYPO 25X1 1.5 SAFETY (NEEDLE) ×1 IMPLANT
NEEDLE HYPO 25X1 1.5 SAFETY (NEEDLE) ×2 IMPLANT
NS IRRIG 1000ML POUR BTL (IV SOLUTION) ×2 IMPLANT
PACK BASIN DAY SURGERY FS (CUSTOM PROCEDURE TRAY) ×2 IMPLANT
PAD CAST 3X4 CTTN HI CHSV (CAST SUPPLIES) IMPLANT
PAD CAST 4YDX4 CTTN HI CHSV (CAST SUPPLIES) IMPLANT
PADDING CAST ABS COTTON 4X4 ST (CAST SUPPLIES) ×2 IMPLANT
PADDING CAST COTTON 3X4 STRL (CAST SUPPLIES)
PADDING CAST COTTON 4X4 STRL (CAST SUPPLIES)
SPLINT FINGER 3.25 911903 (SOFTGOODS) ×1 IMPLANT
STOCKINETTE 4X48 STRL (DRAPES) ×2 IMPLANT
STRIP CLOSURE SKIN 1/2X4 (GAUZE/BANDAGES/DRESSINGS) IMPLANT
SUT ETHILON 3 0 PS 1 (SUTURE) IMPLANT
SUT ETHILON 4 0 PS 2 18 (SUTURE) ×2 IMPLANT
SYR BULB EAR ULCER 3OZ GRN STR (SYRINGE) ×2 IMPLANT
SYR CONTROL 10ML LL (SYRINGE) ×2 IMPLANT
TOWEL GREEN STERILE FF (TOWEL DISPOSABLE) ×4 IMPLANT
UNDERPAD 30X36 HEAVY ABSORB (UNDERPADS AND DIAPERS) ×2 IMPLANT

## 2023-01-27 NOTE — Discharge Instructions (Addendum)
  Post Anesthesia Home Care Instructions  Activity: Get plenty of rest for the remainder of the day. A responsible individual must stay with you for 24 hours following the procedure.  For the next 24 hours, DO NOT: -Drive a car -Paediatric nurse -Drink alcoholic beverages -Take any medication unless instructed by your physician -Make any legal decisions or sign important papers.  Meals: Start with liquid foods such as gelatin or soup. Progress to regular foods as tolerated. Avoid greasy, spicy, heavy foods. If nausea and/or vomiting occur, drink only clear liquids until the nausea and/or vomiting subsides. Call your physician if vomiting continues.  Special Instructions/Symptoms: Your throat may feel dry or sore from the anesthesia or the breathing tube placed in your throat during surgery. If this causes discomfort, gargle with warm salt water. The discomfort should disappear within 24 hours.  If you had a scopolamine patch placed behind your ear for the management of post- operative nausea and/or vomiting:  1. The medication in the patch is effective for 72 hours, after which it should be removed.  Wrap patch in a tissue and discard in the trash. Wash hands thoroughly with soap and water. 2. You may remove the patch earlier than 72 hours if you experience unpleasant side effects which may include dry mouth, dizziness or visual disturbances. 3. Avoid touching the patch. Wash your hands with soap and water after contact with the patch.    Next dose of Tylenol may be taken at [facility]  Wound Care: Keep your hand elevated above the level of your heart.  Do not allow it to dangle by your side.  Keep the dressing dry and do not remove it unless your doctor advises you to do so.  He will usually change it at the time of your post-op visit.  Moving your fingers is advised to stimulate circulation but will depend on the site of your surgery.  If you have a  splint applied, your doctor will advise you regarding movement.  Activity: Do not drive or operate machinery today.  Rest today and then you may return to your normal activity and work as indicated by your physician.  Diet:  Drink liquids today or eat a light diet.  You may resume a regular diet tomorrow.    General expectations: Pain for two to three days. Fingers may become slightly swollen.  Call your doctor if any of the following occur: Severe pain not relieved by pain medication. Elevated temperature. Dressing soaked with blood. Inability to move fingers. White or bluish color to fingers.

## 2023-01-27 NOTE — Op Note (Signed)
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  Left thumb excision mucoid cyst 2.  Left thumb debridement of IP joint including osteophyte from proximal and distal phalanges   SURGEON:  [name], M.D.   ASSISTANT: none   ANESTHESIA:  General   INTRAVENOUS FLUIDS:  Per anesthesia flow sheet.   ESTIMATED BLOOD LOSS:  Minimal.   COMPLICATIONS:  None.   SPECIMENS: Mucoid cyst to pathology   TOURNIQUET TIME:    Total Tourniquet Time Documented: Forearm (Left) - 18 minutes Total: Forearm (Left) - 18 minutes    DISPOSITION:  Stable to PACU.   INDICATIONS: 77 year old female with mucoid cyst of left thumb.  It is bothersome to her.  She wishes to have the cyst removed and the IP joint debrided to try to prevent recurrence.  Risks, benefits and alternatives of surgery were discussed including the risks of blood loss, infection, damage to nerves, vessels, tendons, ligaments, bone for surgery, need for additional surgery, complications with wound healing, continued pain, stiffness, , recurrence.  She voiced understanding of these risks and elected to proceed.  OPERATIVE COURSE:  After being identified preoperatively by myself,  the patient and I agreed on the procedure and site of the procedure.  The surgical site was marked.  Surgical consent had been signed. Preoperative IV antibiotic prophylaxis was given. She was transferred to the operating room and placed on the operating table in supine position with the Left upper extremity on an arm board.  General anesthesia was induced by the anesthesiologist.  Left upper extremity was prepped and draped in normal  sterile orthopedic fashion.  A surgical pause was performed between the surgeons, anesthesia, and operating room staff and all were in agreement as to the patient, procedure, and site of procedure.  Tourniquet at the proximal aspect of the extremity was inflated to 250 mmHg after exsanguination of the arm with an Esmarch bandage.  A hockey-stick shaped incision was made over the IP joint of the thumb.  This was carried in subcutaneous tissues by spreading technique.  The cyst was identified.  Was cleared of soft tissue attachments.  It was removed with the synovectomy rongeurs.  There was some cyst coming from underneath the extensor tendon and within the joint which was also removed.  The IP joint was entered underneath the extensor tendon and debrided including osteophyte at the dorsum of the proximal phalanx.  There was a osteophyte at the dorsal radial aspect of the distal phalanx.  This was taken down with the rongeurs as well.  The wound and joint were copiously irrigated with sterile saline.  The wound was closed with 4-0 nylon in a horizontal mattress fashion.  The cyst was sent to pathology for examination.  A digital block of been performed with quarter percent plain Marcaine to aid in postoperative analgesia.  The wound was dressed with sterile Xeroform 4 x 4 and wrapped with a Coban dressing lightly.  An AlumaFoam splint was placed and wrapped lightly with Coban dressing.  The tourniquet was deflated at 18 minutes.  Fingertips were pink with brisk capillary refill after deflation of tourniquet.  The operative  drapes were broken down.  The patient was awoken from anesthesia safely.  She was transferred back to the stretcher and taken to PACU in stable condition.  I will see her back in the office in 1 week for postoperative followup.  I will give her a prescription for Tramadol 50 mg 1 tab PO q6 hours prn pain, dispense # 20.   [name], MD Electronically signed, 01/27/23

## 2023-01-27 NOTE — Anesthesia Postprocedure Evaluation (Signed)
Anesthesia Post Note  Patient: Ashley [name]  Procedure(s) Performed: LEFT THUMB MUCOID CYST EXCISION AND DEBRIDEMENT INTERPHALANGEAL JOINT (Left: Thumb)     Patient location during evaluation: PACU Anesthesia Type: General Level of consciousness: awake and alert Pain management: pain level controlled Vital Signs Assessment: post-procedure vital signs reviewed and stable Respiratory status: spontaneous breathing, nonlabored ventilation, respiratory function stable and patient connected to nasal cannula oxygen Cardiovascular status: blood pressure returned to baseline and stable Postop Assessment: no apparent nausea or vomiting Anesthetic complications: no   No notable events documented.  Last Vitals:  Vitals:   01/27/23 1415 01/27/23 1432  BP: 138/60 (!) 140/51  Pulse: 61 64  Resp: 12 16  Temp:  (!) 36.2 C  SpO2: 96% 96%    Last Pain:  Vitals:   01/27/23 1432  TempSrc: Oral  PainSc: 0-No pain                 Santa Lighter

## 2023-01-27 NOTE — Transfer of Care (Signed)
Immediate Anesthesia Transfer of Care Note  Patient: Ashley [name]  Procedure(s) Performed: LEFT THUMB MUCOID CYST EXCISION AND DEBRIDEMENT INTERPHALANGEAL JOINT (Left: Thumb)  Patient Location: PACU  Anesthesia Type:General  Level of Consciousness: awake, alert , and oriented  Airway & Oxygen Therapy: Patient Spontanous Breathing and Patient connected to face mask oxygen  Post-op Assessment: Report given to RN and Post -op Vital signs reviewed and stable  Post vital signs: Reviewed and stable  Last Vitals:  Vitals Value Taken Time  BP 113/48 01/27/23 1353  Temp    Pulse 67 01/27/23 1356  Resp 17 01/27/23 1356  SpO2 99 % 01/27/23 1356  Vitals shown include unvalidated device data.  Last Pain:  Vitals:   01/27/23 1138  TempSrc: Oral  PainSc: 0-No pain      Patients Stated Pain Goal: 3 ([id number])  Complications: No notable events documented.

## 2023-01-27 NOTE — Anesthesia Procedure Notes (Signed)
Procedure Name: LMA Insertion Date/Time: 01/27/2023 1:16 PM  Performed by: Bufford Spikes, CRNAPre-anesthesia Checklist: Patient identified, Emergency Drugs available, Suction available and Patient being monitored Patient Re-evaluated:Patient Re-evaluated prior to induction Oxygen Delivery Method: Circle system utilized Preoxygenation: Pre-oxygenation with 100% oxygen Induction Type: IV induction Ventilation: Mask ventilation without difficulty LMA: LMA inserted LMA Size: 3.0 Number of attempts: 1 Placement Confirmation: positive ETCO2 Tube secured with: Tape Dental Injury: Teeth and Oropharynx as per pre-operative assessment

## 2023-01-27 NOTE — Anesthesia Preprocedure Evaluation (Addendum)
Anesthesia Evaluation  Patient identified by MRN, date of birth, ID band Patient awake    Reviewed: Allergy & Precautions, NPO status , Patient's Chart, lab work & pertinent test results  History of Anesthesia Complications (+) PONV and history of anesthetic complications  Airway Mallampati: II  TM Distance: >3 FB Neck ROM: Full    Dental  (+) Teeth Intact, Dental Advisory Given, Implants,    Pulmonary neg pulmonary ROS   Pulmonary exam normal breath sounds clear to auscultation       Cardiovascular hypertension, Pt. on medications + CAD and + Cardiac Stents  Normal cardiovascular exam Rhythm:Regular Rate:Normal     Neuro/Psych  Headaches  negative psych ROS   GI/Hepatic Neg liver ROS, PUD,,,  Endo/Other  negative endocrine ROS    Renal/GU negative Renal ROS     Musculoskeletal negative musculoskeletal ROS (+)  LEFT THUMB MUCOID CYST AND INTERPHALANGEAL JOINT ARTHRITIS   Abdominal   Peds  Hematology negative hematology ROS (+)   Anesthesia Other Findings Day of surgery medications reviewed with the patient.  Reproductive/Obstetrics                             Anesthesia Physical Anesthesia Plan  ASA: 3  Anesthesia Plan: General   Post-op Pain Management: Tylenol PO (pre-op)*   Induction: Intravenous  PONV Risk Score and Plan: 4 or greater and TIVA, Dexamethasone and Ondansetron  Airway Management Planned: LMA  Additional Equipment:   Intra-op Plan:   Post-operative Plan: Extubation in OR  Informed Consent: I have reviewed the patients History and Physical, chart, labs and discussed the procedure including the risks, benefits and alternatives for the proposed anesthesia with the patient or authorized representative who has indicated his/her understanding and acceptance.     Dental advisory given  Plan Discussed with: CRNA  Anesthesia Plan Comments:          Anesthesia Quick Evaluation

## 2023-01-27 NOTE — H&P (Signed)
Ashley [name] is an 77 y.o. female.   Chief Complaint: mucoid cyst HPI: 77 yo female with left thumb mucoid cyst and ip joint arthritis.  She wishes to have excision of the cyst and debridement of the IP joint to try to prevent recurrence.  Allergies:  Allergies  Allergen Reactions   Sulfa Antibiotics Swelling    Angoedema   Demerol Nausea And Vomiting   Lisinopril Swelling    angioedema . Uncertain if due to Sulfa or from ACE-I but ACE-I was discontinued to be safe.   Moviprep [Peg-Kcl-Nacl-Nasulf-Na Asc-C] Nausea And Vomiting   Declomycin [Demeclocycline] Rash    Past Medical History:  Diagnosis Date   Coronary artery disease 01/08/2010   STATUS POST STENTING OF THE LAD AND THE FIRST OBTUSE MARGINAL VESSEL   Diverticulitis    Headache(784.0)    Herpes zoster    Hyperlipidemia    Hypertension    Phlebitis Right   Leg   PONV (postoperative nausea and vomiting)    Stomach ulcer     Past Surgical History:  Procedure Laterality Date   ABDOMINAL HYSTERECTOMY     with bladder tack   BREAST BIOPSY Left    Normal   Bunionectomy Right    CARDIAC CATHETERIZATION  01/08/10   CARDIAC CATHETERIZATION  01/05/10   NORMAL LEFT VENTRICULAR SIZE AND NORMAL SYSTOLIC FUNCTION. EF IS 60%.    CATARACT EXTRACTION     COLONOSCOPY  07/13/2011   RMR:Lax anal sphincter tone otherwise normal/left sided diverticula, polyp removed but not analyzed   COLONOSCOPY N/A 09/21/2016   Procedure: COLONOSCOPY;  Surgeon: Daneil Dolin, MD;  Location: [facility];  Service: Endoscopy;  Laterality: N/A;  1:00 pm   CORONARY STENT PLACEMENT     VAGINAL HYSTERECTOMY     VEIN LIGATION      Family History: Family History  Problem Relation Age of Onset   Emphysema Father    Colon cancer Father    Lupus Child    Heart disease Sister    Heart disease Paternal Grandmother    Hypertension Paternal Grandmother    Heart disease Maternal Grandmother    Hypertension Maternal Grandmother     Social History:    reports that she has never smoked. She has never used smokeless tobacco. She reports current alcohol use. She reports that she does not use drugs.  Medications: Medications Prior to Admission  Medication Sig Dispense Refill   amLODipine (NORVASC) 2.5 MG tablet TAKE ONE TABLET DAILY 90 tablet 0   aspirin EC 81 MG tablet Take 81 mg by mouth at bedtime.     Calcium Carb-Cholecalciferol (OS-CAL CALCIUM + D3 PO) Take 1 tablet by mouth daily. Calcium 1200 mg with Vitamin D     cholecalciferol (VITAMIN D) 1000 units tablet Take 1,000 Units by mouth daily.     furosemide (LASIX) 20 MG tablet TAKE ONE TABLET DAILY 90 tablet 0   Omega-3 Fatty Acids (FISH OIL) 1200 MG CAPS Take 1 capsule by mouth every morning.     Psyllium (METAMUCIL PO) Take one (1) teaspoon by mouth daily at bedtime.     pyridoxine (B-6) 200 MG tablet Take 200 mg daily by mouth.     rosuvastatin (CRESTOR) 40 MG tablet Take 1 tablet (40 mg total) by mouth daily. 90 tablet 3   vitamin B-12 (CYANOCOBALAMIN) 1000 MCG tablet Take 1,000 mcg by mouth daily.     albuterol (VENTOLIN HFA) 108 (90 Base) MCG/ACT inhaler Inhale 2 puffs into the lungs  every 6 (six) hours as needed for wheezing or shortness of breath. 8 g 2   cetirizine (ZYRTEC) 10 MG tablet Take 10 mg by mouth daily.      Results for orders placed or performed during the hospital encounter of 01/27/23 (from the past 48 hour(s))  Basic metabolic panel per protocol     Status: None   Collection Time: 01/26/23  3:30 PM  Result Value Ref Range   Sodium 138 135 - 145 mmol/L   Potassium 3.6 3.5 - 5.1 mmol/L   Chloride 102 98 - 111 mmol/L   CO2 29 22 - 32 mmol/L   Glucose, Bld 98 70 - 99 mg/dL    Comment: Glucose reference range applies only to samples taken after fasting for at least 8 hours.   BUN 15 8 - 23 mg/dL   Creatinine, Ser 0.82 0.44 - 1.00 mg/dL   Calcium 8.9 8.9 - 10.3 mg/dL   GFR, Estimated >60 >60 mL/min    Comment: (NOTE) Calculated using the CKD-EPI Creatinine  Equation (2021)    Anion gap 7 5 - 15    Comment: Performed at [facility] [address] [place], [place] [address]    No results found.    Blood pressure (!) 149/59, pulse 65, temperature 98 F (36.7 C), temperature source Oral, resp. rate 20, height 5' (1.524 m), weight 65.5 kg, SpO2 100 %.  General appearance: alert, cooperative, and appears stated age Head: Normocephalic, without obvious abnormality, atraumatic Neck: supple, symmetrical, trachea midline Extremities: Intact sensation and capillary refill all digits.  +epl/fpl/io.  No wounds.  Pulses: 2+ and symmetric Skin: Skin color, texture, turgor normal. 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  Non operative and operative treatment options have been discussed with the patient and patient wishes to proceed with operative treatment. Risks, benefits, and alternatives of surgery have been discussed and the patient agrees with the plan of care.   [name] 01/27/2023, 1:08 PM

## 2023-01-30 ENCOUNTER — Encounter (HOSPITAL_BASED_OUTPATIENT_CLINIC_OR_DEPARTMENT_OTHER): Payer: Self-pay | Admitting: Orthopedic Surgery

## 2023-01-30 LAB — SURGICAL PATHOLOGY

## 2023-02-01 ENCOUNTER — Ambulatory Visit (INDEPENDENT_AMBULATORY_CARE_PROVIDER_SITE_OTHER): Payer: Medicare HMO

## 2023-02-01 VITALS — Ht 60.0 in | Wt 145.0 lb

## 2023-02-01 DIAGNOSIS — Z0000 Encounter for general adult medical examination without abnormal findings: Secondary | ICD-10-CM | POA: Diagnosis not present

## 2023-02-01 DIAGNOSIS — Z0100 Encounter for examination of eyes and vision without abnormal findings: Secondary | ICD-10-CM

## 2023-02-01 NOTE — Progress Notes (Signed)
Subjective:   CEDAR GIGLIO is a 77 y.o. female who presents for Medicare Annual (Subsequent) preventive examination. I connected with  [name] on 02/01/23 by a audio enabled telemedicine application and verified that I am speaking with the correct person using two identifiers.  Patient Location: Home  Provider Location: Home Office  I discussed the limitations of evaluation and management by telemedicine. The patient expressed understanding and agreed to proceed.  Review of Systems     Cardiac Risk Factors include: advanced age (>55men, >80 women);female gender;hypertension     Objective:    Today's Vitals   02/01/23 0821  Weight: 145 lb (65.8 kg)  Height: 5' (1.524 m)   Body mass index is 28.32 kg/m.     02/01/2023    8:25 AM 01/27/2023   11:35 AM 01/20/2023    2:15 PM 11/09/2021    2:21 PM 11/04/2020    2:55 PM 06/04/2019   10:15 AM 09/21/2016   12:23 PM  Advanced Directives  Does Patient Have a Medical Advance Directive? No No No Yes Yes Yes No  Type of Theatre stage manager of [place];Living will [place];Living will Living will   Does patient want to make changes to medical advance directive?     No - Patient declined No - Patient declined   Copy of [place] in Chart?    No - copy requested No - copy requested    Would patient like information on creating a medical advance directive? No - Patient declined No - Patient declined No - Patient declined    No - patient declined information    Current Medications (verified) Outpatient Encounter Medications as of 02/01/2023  Medication Sig   albuterol (VENTOLIN HFA) 108 (90 Base) MCG/ACT inhaler Inhale 2 puffs into the lungs every 6 (six) hours as needed for wheezing or shortness of breath.   amLODipine (NORVASC) 2.5 MG tablet TAKE ONE TABLET DAILY   aspirin EC 81 MG tablet Take 81 mg by mouth at bedtime.   Calcium Carb-Cholecalciferol (OS-CAL CALCIUM + D3 PO)  Take 1 tablet by mouth daily. Calcium 1200 mg with Vitamin D   cetirizine (ZYRTEC) 10 MG tablet Take 10 mg by mouth daily.   cholecalciferol (VITAMIN D) 1000 units tablet Take 1,000 Units by mouth daily.   furosemide (LASIX) 20 MG tablet TAKE ONE TABLET DAILY   Omega-3 Fatty Acids (FISH OIL) 1200 MG CAPS Take 1 capsule by mouth every morning.   Psyllium (METAMUCIL PO) Take one (1) teaspoon by mouth daily at bedtime.   pyridoxine (B-6) 200 MG tablet Take 200 mg daily by mouth.   rosuvastatin (CRESTOR) 40 MG tablet Take 1 tablet (40 mg total) by mouth daily.   traMADol (ULTRAM) 50 MG tablet 1 tab PO q6 hours prn pain   vitamin B-12 (CYANOCOBALAMIN) 1000 MCG tablet Take 1,000 mcg by mouth daily.   No facility-administered encounter medications on file as of 02/01/2023.    Allergies (verified) Sulfa antibiotics, Demerol, Lisinopril, Moviprep [peg-kcl-nacl-nasulf-na asc-c], and Declomycin [demeclocycline]   History: Past Medical History:  Diagnosis Date   Coronary artery disease 01/08/2010   STATUS POST STENTING OF THE LAD AND THE FIRST OBTUSE MARGINAL VESSEL   Diverticulitis    Headache(784.0)    Herpes zoster    Hyperlipidemia    Hypertension    Phlebitis Right   Leg   PONV (postoperative nausea and vomiting)    Stomach ulcer  Past Surgical History:  Procedure Laterality Date   ABDOMINAL HYSTERECTOMY     with bladder tack   BREAST BIOPSY Left    Normal   Bunionectomy Right    CARDIAC CATHETERIZATION  01/08/10   CARDIAC CATHETERIZATION  01/05/10   NORMAL LEFT VENTRICULAR SIZE AND NORMAL SYSTOLIC FUNCTION. EF IS 60%.    CATARACT EXTRACTION     COLONOSCOPY  07/13/2011   RMR:Lax anal sphincter tone otherwise normal/left sided diverticula, polyp removed but not analyzed   COLONOSCOPY N/A 09/21/2016   Procedure: COLONOSCOPY;  Surgeon: Daneil Dolin, MD;  [facility];  Service: Endoscopy;  Laterality: N/A;  1:00 pm   CORONARY STENT PLACEMENT     CYST EXCISION Left  01/27/2023   Procedure: LEFT THUMB MUCOID CYST EXCISION AND DEBRIDEMENT INTERPHALANGEAL JOINT;  Surgeon: [name], MD;  Location: [facility];  Service: Orthopedics;  Laterality: Left;  87 MIN   VAGINAL HYSTERECTOMY     VEIN LIGATION     Family History  Problem Relation Age of Onset   Emphysema Father    Colon cancer Father    Lupus Child    Heart disease Sister    Heart disease Paternal Grandmother    Hypertension Paternal Grandmother    Heart disease Maternal Grandmother    Hypertension Maternal Grandmother    Social History   Socioeconomic History   Marital status: Married    Spouse name: Berneta Sages   Number of children: 5   Years of education: GED   Highest education level: GED or equivalent  Occupational History   Occupation: retired  Tobacco Use   Smoking status: Never   Smokeless tobacco: Never  Vaping Use   Vaping Use: Never used  Substance and Sexual Activity   Alcohol use: Yes    Comment: Socially-wine   Drug use: No   Sexual activity: Yes    Birth control/protection: Surgical  Other Topics Concern   Not on file  Social History Narrative   One level home with her husband    Husband has dementia and she is sole caregiver   Daughter lives next door   Enjoys line-dancing 1-2x per week and playing pickle ball at [employer]   Social Determinants of Health   Financial Resource Strain: [place]  (02/01/2023)   Overall Financial Resource Strain (CARDIA)    Difficulty of Paying Living Expenses: Not hard at all  Food Insecurity: No Food Insecurity (02/01/2023)   Hunger Vital Sign    Worried About Running Out of Food in the Last Year: Never true    [place] in the Last Year: Never true  Transportation Needs: No Transportation Needs (02/01/2023)   PRAPARE - Hydrologist (Medical): No    Lack of Transportation (Non-Medical): No  Physical Activity: Insufficiently Active (02/01/2023)   Exercise Vital Sign    Days of  Exercise per Week: 3 days    Minutes of Exercise per Session: 30 min  Stress: No Stress Concern Present (02/01/2023)   [place]    Feeling of Stress : Not at all  Social Connections: [place] (02/01/2023)   Social Connection and Isolation Panel [NHANES]    Frequency of Communication with Friends and Family: More than three times a week    Frequency of Social Gatherings with Friends and Family: More than three times a week    Attends Religious Services: More than 4 times per  year    Active Member of Clubs or Organizations: Yes    Attends Music therapist: More than 4 times per year    Marital Status: Married    Tobacco Counseling Counseling given: Not Answered   Clinical Intake:  Pre-visit preparation completed: Yes  Pain : No/denies pain     Nutritional Risks: None Diabetes: No  How often do you need to have someone help you when you read instructions, pamphlets, or other written materials from your doctor or pharmacy?: 1 - Never  Diabetic?no   Interpreter Needed?: No  Information entered by :: Jadene Pierini, LPN   Activities of Daily Living    02/01/2023    8:25 AM 01/27/2023   11:37 AM  In your present state of health, do you have any difficulty performing the following activities:  Hearing? 0 0  Vision? 0 0  Difficulty concentrating or making decisions? 0 0  Walking or climbing stairs? 0 0  Dressing or bathing? 0 0  Doing errands, shopping? 0   Preparing Food and eating ? N   Using the Toilet? N   In the past six months, have you accidently leaked urine? N   Do you have problems with loss of bowel control? N   Managing your Medications? N   Managing your Finances? N   Housekeeping or managing your Housekeeping? N     Patient Care Team: Janora Norlander, DO as PCP - General (Family Medicine) Gala Romney Cristopher Estimable, MD (Gastroenterology) Pcp, No  Indicate any recent  Medical Services you may have received from other than [facility] providers in the past year (date may be approximate).     Assessment:   This is a routine wellness examination for [place].  Hearing/Vision screen Vision Screening - Comments:: Referral 02/01/2023  Dietary issues and exercise activities discussed: Current Exercise Habits: Home exercise routine, Type of exercise: walking;strength training/weights, Time (Minutes): 30, Frequency (Times/Week): 3, Weekly Exercise (Minutes/Week): 90, Intensity: Mild, Exercise limited by: None identified   Goals Addressed             This Visit's Progress    DIET - INCREASE WATER INTAKE   On track    Try to drink 6-8 glasses of water daily.       Depression Screen    02/01/2023    8:24 AM 06/14/2022    2:15 PM 05/23/2022    1:37 PM 04/12/2022   11:43 AM 11/17/2021    2:33 PM 11/09/2021    2:05 PM 11/01/2021    1:59 PM  PHQ 2/9 Scores  PHQ - 2 Score 0 0 0 0 0 0 0  PHQ- 9 Score     0      Fall Risk    02/01/2023    8:22 AM 06/14/2022    2:15 PM 05/23/2022    1:37 PM 04/12/2022   11:43 AM 11/17/2021    2:33 PM  [place] in the past year? 0 0 0 0 0  Number falls in past yr: 0      Injury with Fall? 0      Risk for fall due to : No Fall Risks      Follow up Falls prevention discussed        FALL RISK PREVENTION PERTAINING TO THE HOME:  Any stairs in or around the home? No  If so, are there any without handrails? No  Home free of loose throw rugs in walkways, pet beds, electrical cords,  etc? Yes  Adequate lighting in your home to reduce risk of falls? Yes   ASSISTIVE DEVICES UTILIZED TO PREVENT FALLS:  Life alert? No  Use of a cane, walker or w/c? No  Grab bars in the bathroom? Yes  Shower chair or bench in shower? Yes  Elevated toilet seat or a handicapped toilet? No        02/01/2023    8:26 AM 11/04/2020    2:56 PM 06/04/2019   10:20 AM  6CIT Screen  What Year? 0 points 0 points 0 points  What month? 0 points 0  points 0 points  What time? 0 points 0 points 0 points  Count back from 20 0 points 0 points 0 points  Months in reverse 0 points 0 points 0 points  Repeat phrase 0 points 0 points 4 points  Total Score 0 points 0 points 4 points    Immunizations Immunization History  Administered Date(s) Administered   Fluad Quad(high Dose 65+) 07/24/2020   Influenza-Unspecified 08/16/2018, 08/08/2021   Pneumococcal Conjugate-13 05/04/2020   Pneumococcal Polysaccharide-23 08/18/2016   Zoster Recombinat (Shingrix) 11/17/2021    TDAP status: Due, Education has been provided regarding the importance of this vaccine. Advised may receive this vaccine at local pharmacy or Health Dept. Aware to provide a copy of the vaccination record if obtained from local pharmacy or Health Dept. Verbalized acceptance and understanding.  Flu Vaccine status: Up to date  Pneumococcal vaccine status: Up to date  Covid-19 vaccine status: Completed vaccines  Qualifies for Shingles Vaccine? Yes   Zostavax completed No   Shingrix Completed?: No.    Education has been provided regarding the importance of this vaccine. Patient has been advised to call insurance company to determine out of pocket expense if they have not yet received this vaccine. Advised may also receive vaccine at local pharmacy or Health Dept. Verbalized acceptance and understanding.  Screening Tests Health Maintenance  Topic Date Due   COVID-19 Vaccine (1) Never done   DTaP/Tdap/Td (1 - Tdap) Never done   Zoster Vaccines- Shingrix (2 of 2) 01/12/2022   INFLUENZA VACCINE  02/12/2023 (Originally 06/14/2022)   DEXA SCAN  11/19/2023   Medicare Annual Wellness (AWV)  02/01/2024   MAMMOGRAM  07/07/2024   Pneumonia Vaccine 15+ Years old  Completed   Hepatitis C Screening  Completed   HPV VACCINES  Aged Out   COLONOSCOPY (Pts 45-55yrs Insurance coverage will need to be confirmed)  Discontinued    Health Maintenance  Health Maintenance Due  Topic Date Due    COVID-19 Vaccine (1) Never done   DTaP/Tdap/Td (1 - Tdap) Never done   Zoster Vaccines- Shingrix (2 of 2) 01/12/2022    Colorectal cancer screening: No longer required.   Mammogram status: Completed 07/07/2022. Repeat every year  Bone Density status: Completed 11/18/2021. Results reflect: Bone density results: OSTEOPOROSIS. Repeat every 2 years.  Lung Cancer Screening: (Low Dose CT Chest recommended if Age 64-80 years, 30 pack-year currently smoking OR have quit w/in 15years.) does not qualify.   Lung Cancer Screening Referral: n/a  Additional Screening:  Hepatitis C Screening: does not qualify; Completed 12/13/2018  Vision Screening: Recommended annual ophthalmology exams for early detection of glaucoma and other disorders of the eye. Is the patient up to date with their annual eye exam?  No  Who is the provider or what is the name of the office in which the patient attends annual eye exams? No referral 02/01/2023 If pt is not established with a  provider, would they like to be referred to a provider to establish care? No .   Dental Screening: Recommended annual dental exams for proper oral hygiene  Community Resource Referral / Chronic Care Management: CRR required this visit?  No   CCM required this visit?  No      Plan:     I have personally reviewed and noted the following in the patient's chart:   Medical and social history Use of alcohol, tobacco or illicit drugs  Current medications and supplements including opioid prescriptions. Patient is not currently taking opioid prescriptions. Functional ability and status Nutritional status Physical activity Advanced directives List of other physicians Hospitalizations, surgeries, and ER visits in previous 12 months Vitals Screenings to include cognitive, depression, and falls Referrals and appointments  In addition, I have reviewed and discussed with patient certain preventive protocols, quality metrics, and best  practice recommendations. A written personalized care plan for preventive services as well as general preventive health recommendations were provided to patient.     Daphane Shepherd, LPN   [id number]   Nurse Notes: Due Tdap Vaccine

## 2023-02-01 NOTE — Patient Instructions (Signed)
Ashley [name] , Thank you for taking time to come for your Medicare Wellness Visit. I appreciate your ongoing commitment to your health goals. Please review the following plan we discussed and let me know if I can assist you in the future.   These are the goals we discussed:  Goals      DIET - INCREASE WATER INTAKE     Try to drink 6-8 glasses of water daily.     Exercise 3x per week (30 min per time)     11/04/2020 AWV Goal: Exercise for General Health  Patient will verbalize understanding of the benefits of increased physical activity: Exercising regularly is important. It will improve your overall fitness, flexibility, and endurance. Regular exercise also will improve your overall health. It can help you control your weight, reduce stress, and improve your bone density. Over the next year, patient will increase physical activity as tolerated with a goal of at least 150 minutes of moderate physical activity per week.  You can tell that you are exercising at a moderate intensity if your heart starts beating faster and you start breathing faster but can still hold a conversation. Moderate-intensity exercise ideas include: Walking 1 mile (1.6 km) in about 15 minutes Biking Hiking Golfing Dancing Water aerobics Patient will verbalize understanding of everyday activities that increase physical activity by providing examples like the following: Yard work, such as: Sales promotion account executive Gardening Washing windows or floors Patient will be able to explain general safety guidelines for exercising:  Before you start a new exercise program, talk with your health care provider. Do not exercise so much that you hurt yourself, feel dizzy, or get very short of breath. Wear comfortable clothes and wear shoes with good support. Drink plenty of water while you exercise to prevent dehydration or heat stroke. Work out until  your breathing and your heartbeat get faster.         This is a list of the screening recommended for you and due dates:  Health Maintenance  Topic Date Due   COVID-19 Vaccine (1) Never done   DTaP/Tdap/Td vaccine (1 - Tdap) Never done   Zoster (Shingles) Vaccine (2 of 2) 01/12/2022   Flu Shot  02/12/2023*   DEXA scan (bone density measurement)  11/19/2023   Medicare Annual Wellness Visit  02/01/2024   Mammogram  07/07/2024   Pneumonia Vaccine  Completed   Hepatitis C Screening: USPSTF Recommendation to screen - Ages 18-79 yo.  Completed   HPV Vaccine  Aged Out   Colon Cancer Screening  Discontinued  *Topic was postponed. The date shown is not the original due date.    Advanced directives: Advance directive discussed with you today. I have provided a copy for you to complete at home and have notarized. Once this is complete please bring a copy in to our office so we can scan it into your chart.   Conditions/risks identified: Aim for 30 minutes of exercise or brisk walking, 6-8 glasses of water, and 5 servings of fruits and vegetables each day.   Next appointment: Follow up in one year for your annual wellness visit    Preventive Care 65 Years and Older, Female Preventive care refers to lifestyle choices and visits with your health care provider that can promote health and wellness. What does preventive care include? A yearly physical exam. This is also called an annual well check. Dental exams once or twice  a year. Routine eye exams. Ask your health care provider how often you should have your eyes checked. Personal lifestyle choices, including: Daily care of your teeth and gums. Regular physical activity. Eating a healthy diet. Avoiding tobacco and drug use. Limiting alcohol use. Practicing safe sex. Taking low-dose aspirin every day. Taking vitamin and mineral supplements as recommended by your health care provider. What happens during an annual well check? The  services and screenings done by your health care provider during your annual well check will depend on your age, overall health, lifestyle risk factors, and family history of disease. Counseling  Your health care provider may ask you questions about your: Alcohol use. Tobacco use. Drug use. Emotional well-being. Home and relationship well-being. Sexual activity. Eating habits. History of falls. Memory and ability to understand (cognition). Work and work Statistician. Reproductive health. Screening  You may have the following tests or measurements: Height, weight, and BMI. Blood pressure. Lipid and cholesterol levels. These may be checked every 5 years, or more frequently if you are over 24 years old. Skin check. Lung cancer screening. You may have this screening every year starting at age 49 if you have a 30-pack-year history of smoking and currently smoke or have quit within the past 15 years. Fecal occult blood test (FOBT) of the stool. You may have this test every year starting at age 46. Flexible sigmoidoscopy or colonoscopy. You may have a sigmoidoscopy every 5 years or a colonoscopy every 10 years starting at age 31. Hepatitis C blood test. Hepatitis B blood test. Sexually transmitted disease (STD) testing. Diabetes screening. This is done by checking your blood sugar (glucose) after you have not eaten for a while (fasting). You may have this done every 1-3 years. Bone density scan. This is done to screen for osteoporosis. You may have this done starting at age 71. Mammogram. This may be done every 1-2 years. Talk to your health care provider about how often you should have regular mammograms. Talk with your health care provider about your test results, treatment options, and if necessary, the need for more tests. Vaccines  Your health care provider may recommend certain vaccines, such as: Influenza vaccine. This is recommended every year. Tetanus, diphtheria, and acellular  pertussis (Tdap, Td) vaccine. You may need a Td booster every 10 years. Zoster vaccine. You may need this after age 55. Pneumococcal 13-valent conjugate (PCV13) vaccine. One dose is recommended after age 45. Pneumococcal polysaccharide (PPSV23) vaccine. One dose is recommended after age 25. Talk to your health care provider about which screenings and vaccines you need and how often you need them. This information is not intended to replace advice given to you by your health care provider. Make sure you discuss any questions you have with your health care provider. Document Released: 11/27/2015 Document Revised: 07/20/2016 Document Reviewed: 09/01/2015 Elsevier Interactive Patient Education  2017 [employer] Prevention in the Home Falls can cause injuries. They can happen to people of all ages. There are many things you can do to make your home safe and to help prevent falls. What can I do on the outside of my home? Regularly fix the edges of walkways and driveways and fix any cracks. Remove anything that might make you trip as you walk through a door, such as a raised step or threshold. Trim any bushes or trees on the path to your home. Use bright outdoor lighting. Clear any walking paths of anything that might make someone trip, such as rocks  or tools. Regularly check to see if handrails are loose or broken. Make sure that both sides of any steps have handrails. Any raised decks and porches should have guardrails on the edges. Have any leaves, snow, or ice cleared regularly. Use sand or salt on walking paths during winter. Clean up any spills in your garage right away. This includes oil or grease spills. What can I do in the bathroom? Use night lights. Install grab bars by the toilet and in the tub and shower. Do not use towel bars as grab bars. Use non-skid mats or decals in the tub or shower. If you need to sit down in the shower, use a plastic, non-slip stool. Keep the floor  dry. Clean up any water that spills on the floor as soon as it happens. Remove soap buildup in the tub or shower regularly. Attach bath mats securely with double-sided non-slip rug tape. Do not have throw rugs and other things on the floor that can make you trip. What can I do in the bedroom? Use night lights. Make sure that you have a light by your bed that is easy to reach. Do not use any sheets or blankets that are too big for your bed. They should not hang down onto the floor. Have a firm chair that has side arms. You can use this for support while you get dressed. Do not have throw rugs and other things on the floor that can make you trip. What can I do in the kitchen? Clean up any spills right away. Avoid walking on wet floors. Keep items that you use a lot in easy-to-reach places. If you need to reach something above you, use a strong step stool that has a grab bar. Keep electrical cords out of the way. Do not use floor polish or wax that makes floors slippery. If you must use wax, use non-skid floor wax. Do not have throw rugs and other things on the floor that can make you trip. What can I do with my stairs? Do not leave any items on the stairs. Make sure that there are handrails on both sides of the stairs and use them. Fix handrails that are broken or loose. Make sure that handrails are as long as the stairways. Check any carpeting to make sure that it is firmly attached to the stairs. Fix any carpet that is loose or worn. Avoid having throw rugs at the top or bottom of the stairs. If you do have throw rugs, attach them to the floor with carpet tape. Make sure that you have a light switch at the top of the stairs and the bottom of the stairs. If you do not have them, ask someone to add them for you. What else can I do to help prevent falls? Wear shoes that: Do not have high heels. Have rubber bottoms. Are comfortable and fit you well. Are closed at the toe. Do not wear  sandals. If you use a stepladder: Make sure that it is fully opened. Do not climb a closed stepladder. Make sure that both sides of the stepladder are locked into place. Ask someone to hold it for you, if possible. Clearly mark and make sure that you can see: Any grab bars or handrails. First and last steps. Where the edge of each step is. Use tools that help you move around (mobility aids) if they are needed. These include: Canes. Walkers. Scooters. Crutches. Turn on the lights when you go into a dark area.  Replace any light bulbs as soon as they burn out. Set up your furniture so you have a clear path. Avoid moving your furniture around. If any of your floors are uneven, fix them. If there are any pets around you, be aware of where they are. Review your medicines with your doctor. Some medicines can make you feel dizzy. This can increase your chance of falling. Ask your doctor what other things that you can do to help prevent falls. This information is not intended to replace advice given to you by your health care provider. Make sure you discuss any questions you have with your health care provider. Document Released: 08/27/2009 Document Revised: 04/07/2016 Document Reviewed: 12/05/2014 Elsevier Interactive Patient Education  2017 [employer].

## 2023-02-13 ENCOUNTER — Ambulatory Visit: Payer: Medicare HMO | Admitting: Family Medicine

## 2023-02-13 ENCOUNTER — Encounter: Payer: Self-pay | Admitting: Family Medicine

## 2023-02-13 VITALS — BP 138/70 | HR 69 | Temp 98.3°F | Wt 146.0 lb

## 2023-02-13 DIAGNOSIS — E7800 Pure hypercholesterolemia, unspecified: Secondary | ICD-10-CM | POA: Diagnosis not present

## 2023-02-13 DIAGNOSIS — R159 Full incontinence of feces: Secondary | ICD-10-CM | POA: Diagnosis not present

## 2023-02-13 DIAGNOSIS — R152 Fecal urgency: Secondary | ICD-10-CM

## 2023-02-13 DIAGNOSIS — I10 Essential (primary) hypertension: Secondary | ICD-10-CM | POA: Diagnosis not present

## 2023-02-13 DIAGNOSIS — E049 Nontoxic goiter, unspecified: Secondary | ICD-10-CM

## 2023-02-13 DIAGNOSIS — Z23 Encounter for immunization: Secondary | ICD-10-CM

## 2023-02-13 DIAGNOSIS — N3942 Incontinence without sensory awareness: Secondary | ICD-10-CM

## 2023-02-13 DIAGNOSIS — L84 Corns and callosities: Secondary | ICD-10-CM

## 2023-02-13 MED ORDER — AMLODIPINE BESYLATE 2.5 MG PO TABS: 2.5000 mg | ORAL_TABLET | Freq: Every day | ORAL | 3 refills | Status: DC

## 2023-02-13 NOTE — Progress Notes (Signed)
Subjective: CC: Multiple issues PCP: Janora Norlander, DO [id number] A Botero is a 77 y.o. female presenting to clinic today for:  1.  Hypertension with hyperlipidemia Patient is compliant with Norvasc, Crestor and her fish oils.  She is accompanied today's visit by her spouse and daughter.  The patient does report chronic right-sided lower extremity swelling and this has been chronic and stable since she had staph infection in that right leg.  She wears compression hose and this seems to do well for her  2.  Callus Patient does report a painful callus on the left foot.  She was previously seen by Dr. Irving Shows for another issue and she would like referral back to have this treated as she has been doing it at home but it is really not helping  3.  Neck lump/ fecal incontinence/ urinary incontinence Her daughter voices concern about a growing mass at the base of her neck.  This seems to be getting bigger compared to previous checkup.  Patient is not reporting difficulty swallowing or changes in voice.  No reports of tremor.  There is some question of mental status changes and she would like her mom to be evaluated for memory loss.  She also suffers from both urinary and fecal incontinence.  Referral was placed to gastroenterology last year but apparently the gastroenterology appointment was never scheduled and the referral coordinator did not call the patient nor our office to redirect the referral.  She wears depends.   ROS: Per HPI  Allergies  Allergen Reactions   Sulfa Antibiotics Swelling    Angoedema   Demerol Nausea And Vomiting   Lisinopril Swelling    angioedema . Uncertain if due to Sulfa or from ACE-I but ACE-I was discontinued to be safe.   Moviprep [Peg-Kcl-Nacl-Nasulf-Na Asc-C] Nausea And Vomiting   Declomycin [Demeclocycline] Rash   Past Medical History:  Diagnosis Date   Coronary artery disease 01/08/2010   STATUS POST STENTING OF THE LAD AND THE FIRST OBTUSE MARGINAL  VESSEL   Diverticulitis    Headache(784.0)    Herpes zoster    Hyperlipidemia    Hypertension    Phlebitis Right   Leg   PONV (postoperative nausea and vomiting)    Stomach ulcer     Current Outpatient Medications:    amLODipine (NORVASC) 2.5 MG tablet, TAKE ONE TABLET DAILY, Disp: 90 tablet, Rfl: 0   aspirin EC 81 MG tablet, Take 81 mg by mouth at bedtime., Disp: , Rfl:    Calcium Carb-Cholecalciferol (OS-CAL CALCIUM + D3 PO), Take 1 tablet by mouth daily. Calcium 1200 mg with Vitamin D, Disp: , Rfl:    cholecalciferol (VITAMIN D) 1000 units tablet, Take 1,000 Units by mouth daily., Disp: , Rfl:    Omega-3 Fatty Acids (FISH OIL) 1200 MG CAPS, Take 1 capsule by mouth every morning., Disp: , Rfl:    Psyllium (METAMUCIL PO), Take one (1) teaspoon by mouth daily at bedtime., Disp: , Rfl:    pyridoxine (B-6) 200 MG tablet, Take 200 mg daily by mouth., Disp: , Rfl:    rosuvastatin (CRESTOR) 40 MG tablet, Take 1 tablet (40 mg total) by mouth daily., Disp: 90 tablet, Rfl: 3   traMADol (ULTRAM) 50 MG tablet, 1 tab PO q6 hours prn pain, Disp: 20 tablet, Rfl: 0   vitamin B-12 (CYANOCOBALAMIN) 1000 MCG tablet, Take 1,000 mcg by mouth daily., Disp: , Rfl:  Social History   Socioeconomic History   Marital status: Married    Spouse  name: Berneta Sages   Number of children: 5   Years of education: GED   Highest education level: GED or equivalent  Occupational History   Occupation: retired  Tobacco Use   Smoking status: Never   Smokeless tobacco: Never  Vaping Use   Vaping Use: Never used  Substance and Sexual Activity   Alcohol use: Yes    Comment: Socially-wine   Drug use: No   Sexual activity: Yes    Birth control/protection: Surgical  Other Topics Concern   Not on file  Social History Narrative   One level home with her husband    Husband has dementia and she is sole caregiver   Daughter lives next door   Enjoys line-dancing 1-2x per week and playing pickle ball at [employer]    Social Determinants of Health   Financial Resource Strain: [place]  (02/01/2023)   Overall Financial Resource Strain (CARDIA)    Difficulty of Paying Living Expenses: Not hard at all  Food Insecurity: No Food Insecurity (02/01/2023)   Hunger Vital Sign    Worried About Running Out of Food in the Last Year: Never true    [place] in the Last Year: Never true  Transportation Needs: No Transportation Needs (02/01/2023)   PRAPARE - Hydrologist (Medical): No    Lack of Transportation (Non-Medical): No  Physical Activity: Insufficiently Active (02/01/2023)   Exercise Vital Sign    Days of Exercise per Week: 3 days    Minutes of Exercise per Session: 30 min  Stress: No Stress Concern Present (02/01/2023)   [place]    Feeling of Stress : Not at all  Social Connections: [place] (02/01/2023)   Social Connection and Isolation Panel [NHANES]    Frequency of Communication with Friends and Family: More than three times a week    Frequency of Social Gatherings with Friends and Family: More than three times a week    Attends Religious Services: More than 4 times per year    Active Member of [employer] or Organizations: Yes    Attends Music therapist: More than 4 times per year    Marital Status: Married  Human resources officer Violence: Not At Risk (02/01/2023)   Humiliation, Afraid, Rape, and Kick questionnaire    Fear of Current or Ex-Partner: No    Emotionally Abused: No    Physically Abused: No    Sexually Abused: No   Family History  Problem Relation Age of Onset   Emphysema Father    Colon cancer Father    Lupus Child    Heart disease Sister    Heart disease Paternal Grandmother    Hypertension Paternal Grandmother    Heart disease Maternal Grandmother    Hypertension Maternal Grandmother     Objective: Office vital signs reviewed. BP 138/70   Pulse 69   Temp  98.3 F (36.8 C)   Wt 146 lb (66.2 kg)   SpO2 97%   BMI 28.51 kg/m   Physical Examination:  General: Awake, alert, well-appearing female, No acute distress HEENT: No exophthalmos.  She does have a fullness that would be consistent with a goiter at the base of the neck.  Does not feel nodular Cardio: regular rate and rhythm, S1S2 heard, no murmurs appreciated Pulm: clear to auscultation bilaterally, no wheezes, rhonchi or rales; normal work of breathing on room air Extremities: warm, well perfused, nonpitting edema of the right  lower extremity.  No cyanosis or clubbing; +2 pulses bilaterally MSK: Normal gait and station.  Plantar surface of left foot shows a thickened callus along the MTP on the fifth digit.  She has hallucis valgus deformity as well with associated plantar callus formation Neuro:      02/13/2023    2:12 PM  MMSE - Mini Mental State Exam  Orientation to time 5  Orientation to Place 5  Registration 2  Attention/ Calculation 5  Recall 2  Language- name 2 objects 2  Language- repeat 1  Language- follow 3 step command 3  Language- read & follow direction 1  Write a sentence 1  Copy design 1  Total score 28    Assessment/ Plan: 77 y.o. female   Essential hypertension - Plan: amLODipine (NORVASC) 2.5 MG tablet  Hypercholesterolemia  Incontinence of feces with fecal urgency - Plan: Ambulatory referral to Gastroenterology  Urinary incontinence without sensory awareness - Plan: Ambulatory referral to Urology  Goiter - Plan: TSH, T4, Free, US THYROID  Pre-ulcerative corn or callous - Plan: Ambulatory referral to Podiatry  Blood pressure is well-controlled.  No changes.    She has fasting repeat lipid ordered by her specialist and she knows that she will need to get that done soon.  She was nonfasting today so it was not collected with her thyroid levels  Referral back to her gastroenterologist, Dr. Gala Romney.  I highly encouraged both she and her daughter to  contact me if she is not called in a timely manner for any of the referrals we have placed today.  I placed a referral to urology for urinary incontinence.  Discussed trial of medication but she wanted to be evaluated first as she has failed trials in the past  Seems like she has a goiter at the base of her neck so further evaluate with ultrasound.  Thyroid levels also collected today  Referral back to Dr. Irving Shows for treatment of that callus noted on the left foot  47-month follow-up recommended for full physical exam with fasting labs  No orders of the defined types were placed in this encounter.  No orders of the defined types were placed in this encounter.    Janora Norlander, DO [place] [phone]

## 2023-02-14 LAB — TSH: TSH: 2.12 u[IU]/mL (ref 0.450–4.500)

## 2023-02-14 LAB — T4, FREE: Free T4: 0.98 ng/dL (ref 0.82–1.77)

## 2023-02-15 ENCOUNTER — Encounter: Payer: Self-pay | Admitting: Family Medicine

## 2023-02-16 ENCOUNTER — Other Ambulatory Visit: Payer: Medicare HMO

## 2023-02-17 LAB — LIPID PANEL
Chol/HDL Ratio: 2.1 ratio (ref 0.0–4.4)
Cholesterol, Total: 154 mg/dL (ref 100–199)
HDL: 72 mg/dL (ref >39)
LDL Chol Calc (NIH): 68 mg/dL (ref 0–99)
Triglycerides: 70 mg/dL (ref 0–149)
VLDL Cholesterol Cal: 14 mg/dL (ref 5–40)

## 2023-02-22 ENCOUNTER — Other Ambulatory Visit: Payer: Self-pay | Admitting: Family Medicine

## 2023-02-22 DIAGNOSIS — I10 Essential (primary) hypertension: Secondary | ICD-10-CM

## 2023-02-22 NOTE — Telephone Encounter (Signed)
  The original prescription was discontinued on 02/13/2023 by Waynette Buttery, CMA. Renewing this prescription may not be appropriate.

## 2023-02-24 ENCOUNTER — Ambulatory Visit (HOSPITAL_COMMUNITY): Payer: Medicare HMO

## 2023-02-27 NOTE — Progress Notes (Signed)
Referring Provider: Raliegh Ip, DO  Primary Care Physician:  Raliegh Ip, DO Primary Gastroenterologist:  Dr. Jena Gauss  No chief complaint on file.   HPI:   Ashley [name] is a 77 y.o. female presenting today at the request of Nadine Counts, Ashly M, DO for fecal incontinence.   Reviewed office visit with Dr. Nadine Counts 02/13/2023.  Patient reported both urinary and fecal incontinence.  She was referred to GI and urology.  Last seen in our office in December 2017.  Notably, patient had previously reported fecal smearing.  She had colonoscopy September 21, 2016 revealing internal hemorrhoids, grade 1 lax sphincter tone on rectal exam, scattered small and large mouth diverticula in the sigmoid colon.  Recommended considering colonoscopy in 5 years for surveillance if health permits, consider off label use of Questran for fecal seepage. At the time of her last visit, she had some improvement in fecal seepage with chewable fiber supplement.    Today:    Past Medical History:  Diagnosis Date   Coronary artery disease 01/08/2010   STATUS POST STENTING OF THE LAD AND THE FIRST OBTUSE MARGINAL VESSEL   Diverticulitis    Headache(784.0)    Herpes zoster    Hyperlipidemia    Hypertension    Phlebitis Right   Leg   PONV (postoperative nausea and vomiting)    Stomach ulcer     Past Surgical History:  Procedure Laterality Date   ABDOMINAL HYSTERECTOMY     with bladder tack   BREAST BIOPSY Left    Normal   Bunionectomy Right    CARDIAC CATHETERIZATION  01/08/10   CARDIAC CATHETERIZATION  01/05/10   NORMAL LEFT VENTRICULAR SIZE AND NORMAL SYSTOLIC FUNCTION. EF IS 60%.    CATARACT EXTRACTION     COLONOSCOPY  07/13/2011   RMR:Lax anal sphincter tone otherwise normal/left sided diverticula, polyp removed but not analyzed   COLONOSCOPY N/A 09/21/2016   Procedure: COLONOSCOPY;  Surgeon: Corbin Ade, MD;  [facility]: [facility];  Service: Endoscopy;  Laterality: N/A;  1:00 pm    CORONARY STENT PLACEMENT     CYST EXCISION Left 01/27/2023   Procedure: LEFT THUMB MUCOID CYST EXCISION AND DEBRIDEMENT INTERPHALANGEAL JOINT;  Surgeon: Betha Loa, MD;  Location: [facility];  Service: Orthopedics;  Laterality: Left;  30 MIN   VAGINAL HYSTERECTOMY     VEIN LIGATION      Current Outpatient Medications  Medication Sig Dispense Refill   amLODipine (NORVASC) 2.5 MG tablet Take 1 tablet (2.5 mg total) by mouth daily. 90 tablet 3   aspirin EC 81 MG tablet Take 81 mg by mouth at bedtime.     Calcium Carb-Cholecalciferol (OS-CAL CALCIUM + D3 PO) Take 1 tablet by mouth daily. Calcium 1200 mg with Vitamin D     cholecalciferol (VITAMIN D) 1000 units tablet Take 1,000 Units by mouth daily.     Omega-3 Fatty Acids (FISH OIL) 1200 MG CAPS Take 1 capsule by mouth every morning.     Psyllium (METAMUCIL PO) Take one (1) teaspoon by mouth daily at bedtime.     pyridoxine (B-6) 200 MG tablet Take 200 mg daily by mouth.     rosuvastatin (CRESTOR) 40 MG tablet Take 1 tablet (40 mg total) by mouth daily. 90 tablet 3   traMADol (ULTRAM) 50 MG tablet 1 tab PO q6 hours prn pain 20 tablet 0   vitamin B-12 (CYANOCOBALAMIN) 1000 MCG tablet Take 1,000 mcg by mouth daily.     No  current facility-administered medications for this visit.    Allergies as of 03/02/2023 - Review Complete 02/13/2023  Allergen Reaction Noted   Sulfa antibiotics Swelling 04/15/2020   Demerol Nausea And Vomiting 04/01/2011   Lisinopril Swelling 04/16/2020   Moviprep [peg-[id number]-na asc-c] Nausea And Vomiting 10/04/2012   Declomycin [demeclocycline] Rash 07/23/2013    Family History  Problem Relation Age of Onset   Emphysema Father    Colon cancer Father    Lupus Child    Heart disease Sister    Heart disease Paternal Grandmother    Hypertension Paternal Grandmother    Heart disease Maternal Grandmother    Hypertension Maternal Grandmother     Social History   Socioeconomic History    Marital status: Married    Spouse name: Earvin Hansen   Number of children: 5   Years of education: GED   Highest education level: GED or equivalent  Occupational History   Occupation: retired  Tobacco Use   Smoking status: Never   Smokeless tobacco: Never  Vaping Use   Vaping Use: Never used  Substance and Sexual Activity   Alcohol use: Yes    Comment: Socially-wine   Drug use: No   Sexual activity: Yes    Birth control/protection: Surgical  Other Topics Concern   Not on file  Social History Narrative   One level home with her husband    Husband has dementia and she is sole caregiver   Daughter lives next door   Enjoys line-dancing 1-2x per week and playing pickle ball at [employer]   Social Determinants of Health   Financial Resource Strain: Low Risk  (02/01/2023)   Overall Financial Resource Strain (CARDIA)    Difficulty of Paying Living Expenses: Not hard at all  Food Insecurity: No Food Insecurity (02/01/2023)   Hunger Vital Sign    Worried About Running Out of Food in the Last Year: Never true    Ran Out of Food in the Last Year: Never true  Transportation Needs: No Transportation Needs (02/01/2023)   PRAPARE - Administrator, Civil Service (Medical): No    Lack of Transportation (Non-Medical): No  Physical Activity: Insufficiently Active (02/01/2023)   Exercise Vital Sign    Days of Exercise per Week: 3 days    Minutes of Exercise per Session: 30 min  Stress: No Stress Concern Present (02/01/2023)   [employer] of Occupational Health - Occupational Stress Questionnaire    Feeling of Stress : Not at all  Social Connections: Socially Integrated (02/01/2023)   Social Connection and Isolation Panel [NHANES]    Frequency of Communication with Friends and Family: More than three times a week    Frequency of Social Gatherings with Friends and Family: More than three times a week    Attends Religious Services: More than 4 times per year    Active Member of [employer]  or Organizations: Yes    Attends Engineer, structural: More than 4 times per year    Marital Status: Married  Catering manager Violence: Not At Risk (02/01/2023)   Humiliation, Afraid, Rape, and Kick questionnaire    Fear of Current or Ex-Partner: No    Emotionally Abused: No    Physically Abused: No    Sexually Abused: No    Review of Systems: Gen: Denies any fever, chills, fatigue, weight loss, lack of appetite.  CV: Denies chest pain, heart palpitations, peripheral edema, syncope.  Resp: Denies shortness of breath at rest or with exertion. Denies wheezing  or cough.  GI: Denies dysphagia or odynophagia. Denies jaundice, hematemesis, fecal incontinence. GU : Denies urinary burning, urinary frequency, urinary hesitancy MS: Denies joint pain, muscle weakness, cramps, or limitation of movement.  Derm: Denies rash, itching, dry skin Psych: Denies depression, anxiety, memory loss, and confusion Heme: Denies bruising, bleeding, and enlarged lymph nodes.  Physical Exam: There were no vitals taken for this visit. General:   Alert and oriented. Pleasant and cooperative. Well-nourished and well-developed.  Head:  Normocephalic and atraumatic. Eyes:  Without icterus, sclera clear and conjunctiva pink.  Ears:  Normal auditory acuity. Lungs:  Clear to auscultation bilaterally. No wheezes, rales, or rhonchi. No distress.  Heart:  S1, S2 present without murmurs appreciated.  Abdomen:  +BS, soft, non-tender and non-distended. No HSM noted. No guarding or rebound. No masses appreciated.  Rectal:  Deferred  Msk:  Symmetrical without gross deformities. Normal posture. Extremities:  Without edema. Neurologic:  Alert and  oriented x4;  grossly normal neurologically. Skin:  Intact without significant lesions or rashes. Psych:  Alert and cooperative. Normal mood and affect.    Assessment:     Plan:  ***   Ermalinda Memos, PA-C [facility] 03/02/2023

## 2023-03-01 ENCOUNTER — Ambulatory Visit: Payer: Medicare HMO | Admitting: Gastroenterology

## 2023-03-02 ENCOUNTER — Encounter: Payer: Self-pay | Admitting: Gastroenterology

## 2023-03-02 ENCOUNTER — Ambulatory Visit: Payer: Medicare HMO | Admitting: Gastroenterology

## 2023-03-02 VITALS — BP 135/70 | HR 65 | Temp 97.6°F | Ht 60.0 in | Wt 147.4 lb

## 2023-03-02 DIAGNOSIS — Z800 Family history of malignant neoplasm of digestive organs: Secondary | ICD-10-CM | POA: Diagnosis not present

## 2023-03-02 DIAGNOSIS — R151 Fecal smearing: Secondary | ICD-10-CM

## 2023-03-02 NOTE — Patient Instructions (Addendum)
Start MiraLAX 17 g daily in 8 ounces of water or other noncarbonated beverage of your choice. You may have some diarrhea initially, but this should taper off. My goal is for you to have a good productive bowel movement every day.   Continue Metamucil nightly.  Drink at least 64 ounces of water daily.  Will plan to see back in about 8 weeks.  Do not hesitate to call sooner if you have questions or concerns.  It was a pleasure to meet you today!   Ermalinda Memos, PA-C [facility]

## 2023-03-03 ENCOUNTER — Ambulatory Visit (HOSPITAL_COMMUNITY)
Admission: RE | Admit: 2023-03-03 | Discharge: 2023-03-03 | Disposition: A | Discharge status: Home / Self Care | Payer: Medicare HMO | Source: Ambulatory Visit | Attending: Family Medicine | Admitting: Family Medicine

## 2023-03-03 ENCOUNTER — Encounter: Payer: Self-pay | Admitting: Family Medicine

## 2023-03-03 DIAGNOSIS — E049 Nontoxic goiter, unspecified: Secondary | ICD-10-CM

## 2023-03-14 ENCOUNTER — Telehealth: Payer: Self-pay | Admitting: Family Medicine

## 2023-03-14 DIAGNOSIS — R609 Edema, unspecified: Secondary | ICD-10-CM

## 2023-03-14 MED ORDER — FUROSEMIDE 20 MG PO TABS: 20.0000 mg | ORAL_TABLET | Freq: Every day | ORAL | 1 refills | Status: DC | PRN

## 2023-03-14 NOTE — Telephone Encounter (Signed)
Called patient - she understands that med will increase incontinence she would still like to try on a prn use due to the edema

## 2023-03-14 NOTE — Telephone Encounter (Signed)
I can add it as a PRN but she reported urinary incontinence when I saw her. She sure she wants to go on a med that will increase that?

## 2023-03-14 NOTE — Telephone Encounter (Signed)
Pt called stating that she just had a visit with Dr Ulice Brilliant about her foot and was recommended to start taking her fluid pill again because he said her legs/feet were swollen. Pt wants to know if PCP will put her back on it?

## 2023-04-24 ENCOUNTER — Ambulatory Visit: Payer: Medicare HMO | Admitting: Urology

## 2023-04-29 NOTE — Progress Notes (Unsigned)
Referring Provider: Raliegh Ip, DO Primary Care Physician:  Raliegh Ip, DO Primary GI Physician: Dr. Jena Gauss  No chief complaint on file.   HPI:   Ashley [name] is a 77 y.o. female with history of CAD s/p stenting, HTN, HLD, apparent history of stomach ulcer in the past, family history of colon cancer in her father, presenting today for follow-up of fecal smearing.  Last seen in our office 03/02/2023.  She had chronic history of fecal smearing suspected to be secondary to lax sphincter tone +/- hemorrhoids in the setting of likely chronic constipation with overflow loose stools.  Rectal exam with lax sphincter tone and formed stool in the rectal vault.  Recommended starting MiraLAX daily to improve bowel function and allow for complete emptying with bowel movements.  Continue Metamucil nightly.  Also discussed possible hemorrhoid banding to see if this can improve symptoms, but she preferred to hold off for now.  Also discussed possible surveillance colonoscopy, but she preferred to hold off for now.  Today:      Last colonoscopy in 2017 with internal hemorrhoids, grade 1, lax sphincter tone on rectal exam.  Recommended considering surveillance colonoscopy in 5 years if overall health permits.  Past Medical History:  Diagnosis Date   Coronary artery disease 01/08/2010   STATUS POST STENTING OF THE LAD AND THE FIRST OBTUSE MARGINAL VESSEL   Diverticulitis    Headache(784.0)    Herpes zoster    Hyperlipidemia    Hypertension    Phlebitis Right   Leg   PONV (postoperative nausea and vomiting)    Stomach ulcer     Past Surgical History:  Procedure Laterality Date   ABDOMINAL HYSTERECTOMY     with bladder tack   BREAST BIOPSY Left    Normal   Bunionectomy Right    CARDIAC CATHETERIZATION  01/08/10   CARDIAC CATHETERIZATION  01/05/10   NORMAL LEFT VENTRICULAR SIZE AND NORMAL SYSTOLIC FUNCTION. EF IS 60%.    CATARACT EXTRACTION     COLONOSCOPY  07/13/2011    RMR:Lax anal sphincter tone otherwise normal/left sided diverticula, polyp removed but not analyzed   COLONOSCOPY N/A 09/21/2016   Procedure: COLONOSCOPY;  Surgeon: Corbin Ade, MD;  [facility];  Service: Endoscopy;  Laterality: N/A;  1:00 pm   CORONARY STENT PLACEMENT     CYST EXCISION Left 01/27/2023   Procedure: LEFT THUMB MUCOID CYST EXCISION AND DEBRIDEMENT INTERPHALANGEAL JOINT;  Surgeon: Betha Loa, MD;  Location: [facility];  Service: Orthopedics;  Laterality: Left;  30 MIN   VAGINAL HYSTERECTOMY     VEIN LIGATION      Current Outpatient Medications  Medication Sig Dispense Refill   amLODipine (NORVASC) 2.5 MG tablet Take 1 tablet (2.5 mg total) by mouth daily. 90 tablet 3   aspirin EC 81 MG tablet Take 81 mg by mouth at bedtime.     Calcium Carb-Cholecalciferol (OS-CAL CALCIUM + D3 PO) Take 1 tablet by mouth daily. Calcium 1200 mg with Vitamin D     cholecalciferol (VITAMIN D) 1000 units tablet Take 1,000 Units by mouth daily.     furosemide (LASIX) 20 MG tablet Take 1 tablet (20 mg total) by mouth daily as needed for edema. 30 tablet 1   Omega-3 Fatty Acids (FISH OIL) 1200 MG CAPS Take 1 capsule by mouth every morning.     Psyllium (METAMUCIL PO) Take one (1) teaspoon by mouth daily at bedtime.     pyridoxine (B-6)  200 MG tablet Take 200 mg daily by mouth.     rosuvastatin (CRESTOR) 40 MG tablet Take 1 tablet (40 mg total) by mouth daily. 90 tablet 3   traMADol (ULTRAM) 50 MG tablet 1 tab PO q6 hours prn pain (Patient not taking: Reported on 03/02/2023) 20 tablet 0   vitamin B-12 (CYANOCOBALAMIN) 1000 MCG tablet Take 1,000 mcg by mouth daily.     No current facility-administered medications for this visit.    Allergies as of 05/01/2023 - Review Complete 03/02/2023  Allergen Reaction Noted   Sulfa antibiotics Swelling 04/15/2020   Demerol Nausea And Vomiting 04/01/2011   Lisinopril Swelling 04/16/2020   Moviprep [peg-kcl-nacl-nasulf-na asc-c]  Nausea And Vomiting 10/04/2012   Declomycin [demeclocycline] Rash 07/23/2013    Family History  Problem Relation Age of Onset   Emphysema Father    Colon cancer Father        32s   Heart disease Sister    Heart disease Maternal Grandmother    Hypertension Maternal Grandmother    Heart disease Paternal Grandmother    Hypertension Paternal Grandmother    Lupus Child     Social History   Socioeconomic History   Marital status: Married    Spouse name: Earvin Hansen   Number of children: 5   Years of education: GED   Highest education level: GED or equivalent  Occupational History   Occupation: retired  Tobacco Use   Smoking status: Never   Smokeless tobacco: Never  Vaping Use   Vaping Use: Never used  Substance and Sexual Activity   Alcohol use: Yes    Comment: Socially-wine   Drug use: No   Sexual activity: Yes    Birth control/protection: Surgical  Other Topics Concern   Not on file  Social History Narrative   One level home with her husband    Husband has dementia and she is sole caregiver   Daughter lives next door   Enjoys line-dancing 1-2x per week and playing pickle ball at [employer]   Social Determinants of Health   Financial Resource Strain: Low Risk  (02/01/2023)   Overall Financial Resource Strain (CARDIA)    Difficulty of Paying Living Expenses: Not hard at all  Food Insecurity: No Food Insecurity (02/01/2023)   Hunger Vital Sign    Worried About Running Out of Food in the Last Year: Never true    Ran Out of Food in the Last Year: Never true  Transportation Needs: No Transportation Needs (02/01/2023)   PRAPARE - Administrator, Civil Service (Medical): No    Lack of Transportation (Non-Medical): No  Physical Activity: Insufficiently Active (02/01/2023)   Exercise Vital Sign    Days of Exercise per Week: 3 days    Minutes of Exercise per Session: 30 min  Stress: No Stress Concern Present (02/01/2023)   [employer] of Occupational Health -  Occupational Stress Questionnaire    Feeling of Stress : Not at all  Social Connections: Socially Integrated (02/01/2023)   Social Connection and Isolation Panel [NHANES]    Frequency of Communication with Friends and Family: More than three times a week    Frequency of Social Gatherings with Friends and Family: More than three times a week    Attends Religious Services: More than 4 times per year    Active Member of [employer] or Organizations: Yes    Attends Engineer, structural: More than 4 times per year    Marital Status: Married    Review  of Systems: Gen: Denies fever, chills, cold or flulike symptoms, presyncope, syncope. CV: Denies chest pain, palpitations. Resp: Denies dyspnea at rest, cough. GI: See HPI Heme: See HPI  Physical Exam: There were no vitals taken for this visit. General:   Alert and oriented. No distress noted. Pleasant and cooperative.  Head:  Normocephalic and atraumatic. Eyes:  Conjuctiva clear without scleral icterus. Heart:  S1, S2 present without murmurs appreciated. Lungs:  Clear to auscultation bilaterally. No wheezes, rales, or rhonchi. No distress.  Abdomen:  +BS, soft, non-tender and non-distended. No rebound or guarding. No HSM or masses noted. Msk:  Symmetrical without gross deformities. Normal posture. Extremities:  Without edema. Neurologic:  Alert and  oriented x4 Psych:  Normal mood and affect.    Assessment:     Plan:  ***   Ermalinda Memos, PA-C [facility] 05/01/2023

## 2023-05-01 ENCOUNTER — Encounter: Payer: Self-pay | Admitting: Gastroenterology

## 2023-05-01 ENCOUNTER — Ambulatory Visit (INDEPENDENT_AMBULATORY_CARE_PROVIDER_SITE_OTHER): Payer: Medicare HMO | Admitting: Gastroenterology

## 2023-05-01 VITALS — BP 159/63 | HR 70 | Temp 98.1°F | Ht 60.0 in | Wt 147.0 lb

## 2023-05-01 DIAGNOSIS — R151 Fecal smearing: Secondary | ICD-10-CM

## 2023-05-01 DIAGNOSIS — Z800 Family history of malignant neoplasm of digestive organs: Secondary | ICD-10-CM | POA: Diagnosis not present

## 2023-05-01 NOTE — Patient Instructions (Addendum)
We will arrange for you to have a colonoscopy in the near future with Dr. Jena Gauss.  We will see you back as needed.  I am glad you are feeling much better overall.  Ermalinda Memos, PA-C [facility]enter[facility]

## 2023-05-02 NOTE — Progress Notes (Unsigned)
History of Present Illness: Ashley [name] is a 77 y.o. female who presents today as a new patient at [facility] [place]. All available relevant medical records have been reviewed. She is accompanied by ***. - GU/GYN History: 1. Stress urinary incontinence. Per chart review, had prior "bladder tack".  2. Urge urinary incontinence. Per PCP note on 02/13/2023, patient has failed OAB-type medication trials in the past. She wears Depends. 3. Vaginal atrophy. 4. Fecal incontinence. Followed by GI.  Today: She reports chief complaint of urinary incontinence.  She {Actions; denies-reports:120008} urge incontinence. She {Actions; denies-reports:120008} stress incontinence with ***cough/***laugh/***sneeze/***heavy lifting /***exercise. She reports the ***SUI / ***UUI is predominant.   She leaks *** times per ***. Wears *** ***pads/ ***diapers per day. She reports urinary incontinence {ACTION; IS/IS [id number]} significantly bothersome.   Previously attempted treatments for urge incontinence have included ***.   She {Actions; denies-reports:120008} urgency. She {Actions; denies-reports:120008} ***increased urinary frequency (*** times per day and *** times at night). She {Actions; denies-reports:120008} dysuria.  She {Actions; denies-reports:120008} gross hematuria. She {Actions; denies-reports:120008} the need to strain to void.  She {Actions; denies-reports:120008} sensations of incomplete emptying.  She reports *** UTls in past 12 months. ***No urine culture results in past 12 months found per chart review.   Fall Screening: Do you usually have a device to assist in your mobility? {yes/no:20286} ***cane / ***walker / ***wheelchair  Medications: Current Outpatient Medications  Medication Sig Dispense Refill   amLODipine (NORVASC) 2.5 MG tablet Take 1 tablet (2.5 mg total) by mouth daily. 90 tablet 3   aspirin EC 81 MG tablet Take 81 mg by mouth at bedtime.     Calcium  Carb-Cholecalciferol (OS-CAL CALCIUM + D3 PO) Take 1 tablet by mouth daily. Calcium 1200 mg with Vitamin D     cholecalciferol (VITAMIN D) 1000 units tablet Take 1,000 Units by mouth daily.     Omega-3 Fatty Acids (FISH OIL) 1200 MG CAPS Take 1 capsule by mouth every morning.     rosuvastatin (CRESTOR) 40 MG tablet Take 1 tablet (40 mg total) by mouth daily. 90 tablet 3   vitamin B-12 (CYANOCOBALAMIN) 1000 MCG tablet Take 1,000 mcg by mouth daily.     No current facility-administered medications for this visit.    Allergies: Allergies  Allergen Reactions   Sulfa Antibiotics Swelling    Angoedema   Demerol Nausea And Vomiting   Lisinopril Swelling    angioedema . Uncertain if due to Sulfa or from ACE-I but ACE-I was discontinued to be safe.   Moviprep [Peg-Kcl-Nacl-Nasulf-Na Asc-C] Nausea And Vomiting   Declomycin [Demeclocycline] Rash    Past Medical History:  Diagnosis Date   Coronary artery disease 01/08/2010   STATUS POST STENTING OF THE LAD AND THE FIRST OBTUSE MARGINAL VESSEL   Diverticulitis    Headache(784.0)    Herpes zoster    Hyperlipidemia    Hypertension    Phlebitis Right   Leg   PONV (postoperative nausea and vomiting)    Stomach ulcer    Past Surgical History:  Procedure Laterality Date   ABDOMINAL HYSTERECTOMY     with bladder tack   BREAST BIOPSY Left    Normal   Bunionectomy Right    CARDIAC CATHETERIZATION  01/08/10   CARDIAC CATHETERIZATION  01/05/10   NORMAL LEFT VENTRICULAR SIZE AND NORMAL SYSTOLIC FUNCTION. EF IS 60%.    CATARACT EXTRACTION     COLONOSCOPY  07/13/2011   RMR:Lax anal sphincter tone otherwise normal/left sided diverticula, polyp removed  but not analyzed   COLONOSCOPY N/A 09/21/2016   Procedure: COLONOSCOPY;  Surgeon: Corbin Ade, MD;  [facility];  Service: Endoscopy;  Laterality: N/A;  1:00 pm   CORONARY STENT PLACEMENT     CYST EXCISION Left 01/27/2023   Procedure: LEFT THUMB MUCOID CYST EXCISION AND DEBRIDEMENT  INTERPHALANGEAL JOINT;  Surgeon: Betha Loa, MD;  Location: [facility];  Service: Orthopedics;  Laterality: Left;  30 MIN   VAGINAL HYSTERECTOMY     VEIN LIGATION     Family History  Problem Relation Age of Onset   Emphysema Father    Colon cancer Father        54s   Heart disease Sister    Heart disease Maternal Grandmother    Hypertension Maternal Grandmother    Heart disease Paternal Grandmother    Hypertension Paternal Grandmother    Lupus Child    Social History   Socioeconomic History   Marital status: Married    Spouse name: Earvin Hansen   Number of children: 5   Years of education: GED   Highest education level: GED or equivalent  Occupational History   Occupation: retired  Tobacco Use   Smoking status: Never   Smokeless tobacco: Never  Vaping Use   Vaping Use: Never used  Substance and Sexual Activity   Alcohol use: Yes    Comment: Socially-wine   Drug use: No   Sexual activity: Yes    Birth control/protection: Surgical  Other Topics Concern   Not on file  Social History Narrative   One level home with her husband    Husband has dementia and she is sole caregiver   Daughter lives next door   Enjoys line-dancing 1-2x per week and playing pickle ball at [employer]   Social Determinants of Health   Financial Resource Strain: Low Risk  (02/01/2023)   Overall Financial Resource Strain (CARDIA)    Difficulty of Paying Living Expenses: Not hard at all  Food Insecurity: No Food Insecurity (02/01/2023)   Hunger Vital Sign    Worried About Running Out of Food in the Last Year: Never true    Ran Out of Food in the Last Year: Never true  Transportation Needs: No Transportation Needs (02/01/2023)   PRAPARE - Administrator, Civil Service (Medical): No    Lack of Transportation (Non-Medical): No  Physical Activity: Insufficiently Active (02/01/2023)   Exercise Vital Sign    Days of Exercise per Week: 3 days    Minutes of Exercise per Session:  30 min  Stress: No Stress Concern Present (02/01/2023)   [employer] of Occupational Health - Occupational Stress Questionnaire    Feeling of Stress : Not at all  Social Connections: Socially Integrated (02/01/2023)   Social Connection and Isolation Panel [NHANES]    Frequency of Communication with Friends and Family: More than three times a week    Frequency of Social Gatherings with Friends and Family: More than three times a week    Attends Religious Services: More than 4 times per year    Active Member of [employer] or Organizations: Yes    Attends Banker Meetings: More than 4 times per year    Marital Status: Married  Catering manager Violence: Not At Risk (02/01/2023)   Humiliation, Afraid, Rape, and Kick questionnaire    Fear of Current or Ex-Partner: No    Emotionally Abused: No    Physically Abused: No    Sexually Abused: No  SUBJECTIVE  Review of Systems Constitutional: Patient ***denies any unintentional weight loss or change in strength lntegumentary: Patient ***denies any rashes or pruritus Eyes: Patient denies ***dry eyes ENT: Patient ***denies dry mouth Cardiovascular: Patient ***denies chest pain or syncope Respiratory: Patient ***denies shortness of breath Gastrointestinal: Patient ***denies nausea, vomiting, constipation, or diarrhea Musculoskeletal: Patient ***denies muscle cramps or weakness Neurologic: Patient ***denies convulsions or seizures Psychiatric: Patient ***denies memory problems Allergic/Immunologic: Patient ***denies recent allergic reaction(s) Hematologic/Lymphatic: Patient denies bleeding tendencies Endocrine: Patient ***denies heat/cold intolerance  GU: As per HPI.  OBJECTIVE There were no vitals filed for this visit. There is no height or weight on file to calculate BMI.  Physical Examination  Constitutional: ***No obvious distress; patient is ***non-toxic appearing  Cardiovascular: ***No visible lower extremity edema.   Respiratory: The patient does ***not have audible wheezing/stridor; respirations do ***not appear labored  Gastrointestinal: Abdomen ***non-distended Musculoskeletal: ***Normal ROM of UEs  Skin: ***No obvious rashes/open sores  Neurologic: CN 2-12 grossly ***intact Psychiatric: Answered questions ***appropriately with ***normal affect  Hematologic/Lymphatic/Immunologic: ***No obvious bruises or sites of spontaneous bleeding  UA: {Desc; negative/positive:13464} *** WBC/hpf, *** RBC/hpf, bacteria (***) *** nitrites, *** leukocytes, *** blood PVR: *** ml  ASSESSMENT No diagnosis found. ***  Will plan for follow up in *** months or sooner if needed. Pt verbalized understanding and agreement. All questions were answered.  PLAN Advised the following: *** ***No follow-ups on file.  No orders of the defined types were placed in this encounter.   It has been explained that the patient is to follow regularly with their PCP in addition to all other providers involved in their care and to follow instructions provided by these respective offices. Patient advised to contact urology clinic if any urologic-pertaining questions, concerns, new symptoms or problems arise in the interim period.  There are no Patient Instructions on file for this visit.  Electronically signed by:  Donnita Falls, MSN, FNP-C, CUNP 05/02/2023 1:50 PM

## 2023-05-03 ENCOUNTER — Encounter: Payer: Self-pay | Admitting: Urology

## 2023-05-03 ENCOUNTER — Ambulatory Visit: Payer: Medicare HMO | Admitting: Urology

## 2023-05-03 VITALS — BP 156/76 | HR 69 | Temp 98.1°F

## 2023-05-03 DIAGNOSIS — N3281 Overactive bladder: Secondary | ICD-10-CM

## 2023-05-03 DIAGNOSIS — N3946 Mixed incontinence: Secondary | ICD-10-CM

## 2023-05-03 DIAGNOSIS — R350 Frequency of micturition: Secondary | ICD-10-CM

## 2023-05-03 DIAGNOSIS — N952 Postmenopausal atrophic vaginitis: Secondary | ICD-10-CM

## 2023-05-03 DIAGNOSIS — N393 Stress incontinence (female) (male): Secondary | ICD-10-CM

## 2023-05-03 DIAGNOSIS — N3941 Urge incontinence: Secondary | ICD-10-CM

## 2023-05-03 DIAGNOSIS — R3915 Urgency of urination: Secondary | ICD-10-CM

## 2023-05-03 LAB — URINALYSIS, ROUTINE W REFLEX MICROSCOPIC
Bilirubin, UA: NEGATIVE
Glucose, UA: NEGATIVE
Ketones, UA: NEGATIVE
Leukocytes,UA: NEGATIVE
Nitrite, UA: NEGATIVE
Protein,UA: NEGATIVE
Specific Gravity, UA: <1.005 — ABNORMAL LOW (ref 1.005–1.030)
Urobilinogen, Ur: 0.2 mg/dL (ref 0.2–1.0)
pH, UA: 6.5 (ref 5.0–7.5)

## 2023-05-03 LAB — MICROSCOPIC EXAMINATION: Bacteria, UA: NONE SEEN

## 2023-05-03 LAB — BLADDER SCAN AMB NON-IMAGING

## 2023-05-03 MED ORDER — MIRABEGRON ER 25 MG PO TB24: 25.0000 mg | ORAL_TABLET | Freq: Every day | ORAL | 2 refills | Status: DC

## 2023-05-03 NOTE — Patient Instructions (Signed)
Overactive bladder (OAB) overview for patients:  Symptoms may include: urinary urgency ("gotta go" feeling) urinary frequency (voiding >8 times per day) night time urination (nocturia) urge incontinence of urine (UUI)  While we do not know the exact etiology of OAB, several treatment options exist including:  Behavioral therapy: Reducing fluid intake Decreasing bladder stimulants (such as caffeine) and irritants (such as acidic food, spicy foods, alcohol) Urge suppression strategies Bladder retraining via timed voiding  Pelvic floor physical therapy  Medication(s) - can use one or both of the drug classes below. Anticholinergic / antimuscarinic medications:  Mechanism of action: Activate M3 receptors to reduce detrusor stimulation and increase bladder capacity  (parasympathetic nervous system). Effect: Relaxes the bladder to decrease overactivity, increase bladder storage capacity, and increase time between voids. Onset: Slow acting (may take 8-12 weeks to determine efficacy). Medications include: Vesicare (Solifenacin), Ditropan (Oxybutynin), Detrol (Tolterodine), Toviaz (Fesoterodine), Sanctura (Trospium), Urispas (Flavoxate), Enablex (Darifenacin), Bentyl (Dicyclomine), Levsin (Hyoscyamine ). Potential side effects include but are not limited to: Dry eyes, dry mouth, constipation, cognitive impairment, dementia risk with long term use, and urinary retention/ incomplete bladder emptying. Insurance companies generally prefer for patients to try 1-2 anticholinergic / antimuscarinic medications first due to low cost. Some exceptions are made based on patient-specific comorbidities / risk factors. Beta-3 agonist medications: Mechanism of action: Stimulates selective B3 adrenergic receptors to cause smooth muscle bladder relaxation (sympathetic nervous system). Effect: Relaxes the bladder to decrease overactivity, increase bladder storage capacity, and increase time between voids. Onset:  Slow acting (may take 8-12 weeks to determine efficacy). Medications include: Myrbetriq (Mirabegron) and Vibegron (Gemtesa). Potential side effects include but are not limited to: urinary retention / incomplete bladder emptying and elevated blood pressure (more likely to occur in individuals with pre-existing uncontrolled hypertension). These medications tend to be more expensive than the anticholinergic / antimuscarinic medications.   For patients with refractory OAB (if the above treatment options have been unsuccessful): Posterior tibial nerve stimulation (PTNS). Small acupuncture-type needle inserted near ankle with electric current to stimulate bladder via posterior tibial nerve pathway. Initially requires 12 weekly in-office treatments lasting 30 minutes each; followed by monthly in-office treatments lasting 30 minutes each for 1 year.  Bladder Botox injections. How it is done: Typically done via in-office cystoscopy; sometimes done in the OR depending on the situation. The bladder is numbed with lidocaine instilled via a catheter. Then the urologist injects Botox into the bladder muscle wall in about 20 locations. Causes local paralysis of the bladder muscle at the injection sites to reduce bladder muscle overactivity / spasms. The effect lasts for approximately 6 months and cannot be reversed once performed. Risks may included but are not limited to: infection, incomplete bladder emptying/ urinary retention, short term need for self-catheterization or indwelling catheter, and need for repeat therapy. There is a 5-12% chance of needing to catheterize with Botox - that usually resolves in a few months as the Botox wears off. Typically Botox injections would need to be repeated every 3-12 months since this is not a permanent therapy.  Sacral neuromodulation trial (Medtronic lnterStim or Axonics implant). Sacral neuromodulation is FDA-approved for uncontrolled urinary urgency, urinary frequency,  urinary urge incontinence, non-obstructive urinary retention, or fecal incontinence. It is not FDA-approved as a treatment for pain. The goal of this therapy is at least a 50% improvement in symptoms. It is NOT realistic to expect a 100% cure. This is a a 2-step outpatient procedure. After a successful test period, a permanent wire and generator are   placed in the OR. We discussed the risk of infection. We reviewed the fact that about 30% of patients fail the test phase and are not candidates for permanent generator placement. During the 1-2 week trial phase, symptoms are documented by the patient to determine response. If patient gets at least a 50% improvement in symptoms, they may then proceed with Step 2. Step 1: Trial lead placement. Per physician discretion, may done one of two ways: Percutaneous nerve evaluation (PNE) in the [facility] office. Performed by urologist under local anesthesia (numbing the area with lidocaine) using a spinal needle for placement of test wire, which usually stays in place for 5-7 days to determine therapy response. Test lead placement in OR under anesthesia. Usually stays in place 2 weeks to determine therapy response. > Step 2: Permanent implantation of sacral neuromodulation device, which is performed in the OR.  Sacral neuromodulation implants: All are conditionally MRI safe. Manufacturer: Medtronic Website: www.medtronic.com/uk-en/patients/treatments-therapies/neurostimulator-overactive-bladder/getting therapy/right-for-you.html Options: lnterStim X: Non-rechargeable. The battery lasts 10 years on average. lnterStim Micro: Rechargeable. The battery lasts 15 years on average and must be charged routinely. Approximately 50% smaller implant than lnterStim X implant.  Manufacturer: Axonics Website: Findrealrelief.axonics.com Options: Non-rechargeable (Axonics F15): The battery lasts 15 years on average. Rechargeable (Axonics R20): The battery lasts 20 years on  average and must be charged in office for about 1 hour every 6-10 months on average. Approximately 50% smaller implant than Axonics non-rechargeable implant.  Note: Generally the rechargeable devices are only advised for very small or thin patients who may not have sufficient adipose tissue to comfortably overlay the implanted device.   

## 2023-05-30 ENCOUNTER — Telehealth: Payer: Self-pay | Admitting: *Deleted

## 2023-05-30 MED ORDER — NA SULFATE-K SULFATE-MG SULF 17.5-3.13-1.6 GM/177ML PO SOLN: ORAL | 0 refills | Status: DC

## 2023-05-30 NOTE — Telephone Encounter (Signed)
Called pt. Scheduled TCS with Dr. Jena Gauss, ASA 3 on 8/14. Aware will send instructions via mail. Will call back regarding pre-op appt. RX for prep sent to pharmacy.

## 2023-05-31 NOTE — Telephone Encounter (Signed)
Called pt and she is aware of pre-op appt details

## 2023-06-09 ENCOUNTER — Ambulatory Visit
Admission: EM | Admit: 2023-06-09 | Discharge: 2023-06-09 | Disposition: A | Discharge status: Home / Self Care | Payer: Medicare HMO | Source: Home / Self Care

## 2023-06-09 ENCOUNTER — Telehealth: Payer: Self-pay | Admitting: Family Medicine

## 2023-06-09 DIAGNOSIS — W5501XA Bitten by cat, initial encounter: Secondary | ICD-10-CM | POA: Diagnosis not present

## 2023-06-09 DIAGNOSIS — L989 Disorder of the skin and subcutaneous tissue, unspecified: Secondary | ICD-10-CM

## 2023-06-09 DIAGNOSIS — S51851A Open bite of right forearm, initial encounter: Secondary | ICD-10-CM | POA: Diagnosis not present

## 2023-06-09 MED ORDER — AMOXICILLIN-POT CLAVULANATE 875-125 MG PO TABS: 1.0000 | ORAL_TABLET | Freq: Two times a day (BID) | ORAL | 0 refills | Status: DC

## 2023-06-09 NOTE — ED Provider Notes (Signed)
[facility]    CSN: [id number] Arrival date & time: 06/09/23  1443      History   Chief Complaint No chief complaint on file.   HPI Ashley [name] is a 77 y.o. female.   Presenting today with a cat bite to the right forearm that occurred 5 days ago.  Denies bleeding, drainage, fever, chills but has noticed some worsening redness to the area.  States it was her son's cat who is up-to-date on all vaccines.  She is up-to-date on her tetanus shot per patient.  So far cleaning and applying Polysporin daily.    Past Medical History:  Diagnosis Date   Coronary artery disease 01/08/2010   STATUS POST STENTING OF THE LAD AND THE FIRST OBTUSE MARGINAL VESSEL   Diverticulitis    Headache(784.0)    Herpes zoster    Hyperlipidemia    Hypertension    Phlebitis Right   Leg   PONV (postoperative nausea and vomiting)    Stomach ulcer     Patient Active Problem List   Diagnosis Date Noted   Dyslipidemia 11/20/2022   Leg swelling 08/16/2022   Rash 06/09/2021   Left wrist pain 08/31/2020   Essential hypertension 12/11/2018   Grade I internal hemorrhoids 11/10/2016   FH: colon cancer 09/01/2016   Rectal bleeding 09/01/2016   History of colonic polyps 09/01/2016   Symptomatic menopausal or female climacteric states 07/23/2013   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 EF IS 60%.    CATARACT EXTRACTION     COLONOSCOPY  07/13/2011   RMR:Lax anal sphincter tone otherwise normal/left sided diverticula, polyp removed but  not analyzed   COLONOSCOPY N/A 09/21/2016   Procedure: COLONOSCOPY;  Surgeon: Corbin Ade, MD;  [facility];  Service: Endoscopy;  Laterality: N/A;  1:00 pm   CORONARY STENT PLACEMENT     CYST EXCISION Left 01/27/2023   Procedure: LEFT THUMB MUCOID CYST EXCISION AND DEBRIDEMENT INTERPHALANGEAL JOINT;  Surgeon: Betha Loa, MD;  Location: [facility];  Service: Orthopedics;  Laterality: Left;  30 MIN   VAGINAL HYSTERECTOMY     VEIN LIGATION      OB History     Gravida  5   Para  5   Term  5   Preterm      AB  0   Living  5      SAB      IAB      Ectopic      Multiple      Live Births               Home Medications    Prior to Admission medications   Medication Sig Start Date End Date Taking? Authorizing Provider  amoxicillin-clavulanate (AUGMENTIN) 875-125 MG tablet Take 1 tablet by mouth every 12 (twelve) hours. 06/09/23  Yes Particia Nearing, PA-C  amLODipine (NORVASC) 2.5 MG tablet Take 1 tablet (2.5 mg total) by mouth daily. 02/13/23   Raliegh Ip,  DO  aspirin EC 81 MG tablet Take 81 mg by mouth at bedtime.    [provider]  Calcium Carb-Cholecalciferol (OS-CAL CALCIUM + D3 PO) Take 1 tablet by mouth daily. Calcium 1200 mg with Vitamin D    [provider]  cholecalciferol (VITAMIN D) 1000 units tablet Take 1,000 Units by mouth daily.    [provider]  mirabegron ER (MYRBETRIQ) 25 MG TB24 tablet Take 1 tablet (25 mg total) by mouth daily. 05/03/23   Donnita Falls, FNP  Na Sulfate-K Sulfate-Mg Sulf 17.5-3.13-1.6 GM/177ML SOLN As directed 05/30/23   Rourk, Gerrit Friends, MD  Omega-3 Fatty Acids (FISH OIL) 1200 MG CAPS Take 1 capsule by mouth every morning.    [provider]  rosuvastatin (CRESTOR) 40 MG tablet Take 1 tablet (40 mg total) by mouth daily. 11/23/22   Rollene Rotunda, MD  vitamin B-12 (CYANOCOBALAMIN) 1000 MCG tablet Take 1,000 mcg by mouth daily.    [provider]     Family History Family History  Problem Relation Age of Onset   Emphysema Father    Colon cancer Father        25s   Heart disease Sister    Heart disease Maternal Grandmother    Hypertension Maternal Grandmother    Heart disease Paternal Grandmother    Hypertension Paternal Grandmother    Lupus Child     Social History Social History   Tobacco Use   Smoking status: Never   Smokeless tobacco: Never  Vaping Use   Vaping status: Never Used  Substance Use Topics   Alcohol use: Yes    Comment: Socially-wine   Drug use: No     Allergies   Sulfa antibiotics, Demerol, Lisinopril, Moviprep [peg-kcl-nacl-nasulf-na asc-c], and Declomycin [demeclocycline]   Review of Systems Review of Systems PER HPI  Physical Exam Triage Vital Signs ED Triage Vitals [06/09/23 1520]  Encounter Vitals Group     BP (!) 148/70     Systolic BP Percentile      Diastolic BP Percentile      Pulse Rate 76     Resp 20     Temp 98.4 F (36.9 C)     Temp Source Oral     SpO2 96 %     Weight      Height      Head Circumference      Peak Flow      Pain Score 0     Pain Loc      Pain Education      Exclude from Growth Chart    No data found.  Updated Vital Signs BP (!) 148/70 (BP Location: Left Arm)   Pulse 76   Temp 98.4 F (36.9 C) (Oral)   Resp 20   SpO2 96%   Visual Acuity Right Eye Distance:   Left Eye Distance:   Bilateral Distance:    Right Eye Near:   Left Eye Near:    Bilateral Near:     Physical Exam Vitals and nursing note reviewed.  Constitutional:      Appearance: Normal appearance. She is not ill-appearing.  HENT:     Head: Atraumatic.  Eyes:     Extraocular Movements: Extraocular movements intact.     Conjunctiva/sclera: Conjunctivae normal.  Cardiovascular:     Rate and Rhythm: Normal rate.  Pulmonary:     Effort: Pulmonary effort is normal.  Musculoskeletal:        General: Normal range of motion.  Cervical back: Normal range of motion and  neck supple.  Skin:    General: Skin is warm.     Comments: 1.5 cm abrasion to the right forearm with some surrounding erythema.  No drainage, bleeding, fluctuance, induration to the area.  Neurological:     Mental Status: She is alert and oriented to person, place, and time.     Comments: Right upper extremity neurovascularly intact  Psychiatric:        Mood and Affect: Mood normal.        Thought Content: Thought content normal.        Judgment: Judgment normal.    UC Treatments / Results  Labs (all labs ordered are listed, but only abnormal results are displayed) Labs Reviewed - No data to display  EKG  Radiology No results found.  Procedures Procedures (including critical care time)  Medications Ordered in UC Medications - No data to display  Initial Impression / Assessment and Plan / UC Course  I have reviewed the triage vital signs and the nursing notes.  Pertinent labs & imaging results that were available during my care of the patient were reviewed by me and considered in my medical decision making (see chart for details).     Treat with Augmentin, continue good home wound care with Hibiclens, Polysporin.  Up-to-date on tetanus shot and cat up-to-date on rabies.  Follow-up for worsening symptoms.  Final Clinical Impressions(s) / UC Diagnoses   Final diagnoses:  Skin lesion  Cat bite of right forearm, initial encounter   Discharge Instructions   None    ED Prescriptions     Medication Sig Dispense Auth. Provider   amoxicillin-clavulanate (AUGMENTIN) 875-125 MG tablet Take 1 tablet by mouth every 12 (twelve) hours. 14 tablet Particia Nearing, [place]      PDMP not reviewed this encounter.   Particia Nearing, [place] 06/09/23 1643

## 2023-06-09 NOTE — Telephone Encounter (Signed)
Called and spoke with patient. She is up to date on tdap. But she was advised that cat bites can trap bacteria and she should not wait any longer she should go to urgent care to get treated. Patient aware and verbalized understanding.

## 2023-06-09 NOTE — ED Triage Notes (Signed)
Pt reports she has gotten by her sons cat in her right forearm/wrist area x 5 days.   States cat had its shots.

## 2023-06-09 NOTE — Telephone Encounter (Signed)
  Incoming Patient Call  06/09/2023  What symptoms do you have? Pt got bit by a cat on 06/04/23 on her arm/wrist its a little red no other symptoms  How long have you been sick? Since 06/04/23  Have you been seen for this problem? No pt called to get appt today but no appts available offered Monday or [facility] in [place] pt wants to know if it is ok to wait until Monday?  If your provider decides to give you a prescription, which pharmacy would you like for it to be sent to? [facility]   Patient informed that this information will be sent to the clinical staff for review and that they should receive a follow up call.

## 2023-06-26 ENCOUNTER — Other Ambulatory Visit: Payer: Self-pay

## 2023-06-26 ENCOUNTER — Encounter (HOSPITAL_COMMUNITY): Payer: Self-pay

## 2023-06-26 ENCOUNTER — Encounter (HOSPITAL_COMMUNITY)
Admission: RE | Admit: 2023-06-26 | Discharge: 2023-06-26 | Disposition: A | Discharge status: Home / Self Care | Payer: Medicare HMO | Source: Ambulatory Visit | Attending: Internal Medicine | Admitting: Internal Medicine

## 2023-06-28 ENCOUNTER — Ambulatory Visit: Payer: Medicare HMO | Admitting: Urology

## 2023-06-28 ENCOUNTER — Ambulatory Visit (HOSPITAL_COMMUNITY)
Admission: RE | Admit: 2023-06-28 | Discharge: 2023-06-28 | Disposition: A | Discharge status: Home / Self Care | Payer: Medicare HMO | Attending: Internal Medicine | Admitting: Internal Medicine

## 2023-06-28 ENCOUNTER — Ambulatory Visit (HOSPITAL_COMMUNITY): Payer: Medicare HMO | Admitting: Certified Registered"

## 2023-06-28 ENCOUNTER — Ambulatory Visit (HOSPITAL_BASED_OUTPATIENT_CLINIC_OR_DEPARTMENT_OTHER): Payer: Medicare HMO | Admitting: Certified Registered"

## 2023-06-28 ENCOUNTER — Encounter (HOSPITAL_COMMUNITY)
Admission: RE | Disposition: A | Discharge status: Home / Self Care | Payer: Self-pay | Source: Home / Self Care | Attending: Internal Medicine

## 2023-06-28 ENCOUNTER — Encounter (HOSPITAL_COMMUNITY): Payer: Self-pay | Admitting: Internal Medicine

## 2023-06-28 DIAGNOSIS — I2510 Atherosclerotic heart disease of native coronary artery without angina pectoris: Secondary | ICD-10-CM | POA: Insufficient documentation

## 2023-06-28 DIAGNOSIS — K5730 Diverticulosis of large intestine without perforation or abscess without bleeding: Secondary | ICD-10-CM

## 2023-06-28 DIAGNOSIS — Z86010 Personal history of colonic polyps: Secondary | ICD-10-CM | POA: Diagnosis not present

## 2023-06-28 DIAGNOSIS — Z1211 Encounter for screening for malignant neoplasm of colon: Secondary | ICD-10-CM

## 2023-06-28 DIAGNOSIS — D123 Benign neoplasm of transverse colon: Secondary | ICD-10-CM

## 2023-06-28 DIAGNOSIS — D126 Benign neoplasm of colon, unspecified: Secondary | ICD-10-CM

## 2023-06-28 DIAGNOSIS — Z800 Family history of malignant neoplasm of digestive organs: Secondary | ICD-10-CM | POA: Diagnosis not present

## 2023-06-28 DIAGNOSIS — I10 Essential (primary) hypertension: Secondary | ICD-10-CM | POA: Insufficient documentation

## 2023-06-28 DIAGNOSIS — Z955 Presence of coronary angioplasty implant and graft: Secondary | ICD-10-CM | POA: Insufficient documentation

## 2023-06-28 DIAGNOSIS — R151 Fecal smearing: Secondary | ICD-10-CM

## 2023-06-28 HISTORY — PX: POLYPECTOMY: SHX149

## 2023-06-28 HISTORY — PX: COLONOSCOPY WITH PROPOFOL: SHX5780

## 2023-06-28 SURGERY — COLONOSCOPY WITH PROPOFOL
Anesthesia: General

## 2023-06-28 MED ORDER — LACTATED RINGERS IV SOLN: INTRAVENOUS | Status: DC

## 2023-06-28 MED ORDER — LIDOCAINE HCL (CARDIAC) PF 100 MG/5ML IV SOSY
PREFILLED_SYRINGE | INTRAVENOUS | Status: DC | PRN
  Administered 2023-06-28: 80 mg via INTRAVENOUS

## 2023-06-28 MED ORDER — PHENYLEPHRINE 80 MCG/ML (10ML) SYRINGE FOR IV PUSH (FOR BLOOD PRESSURE SUPPORT)
PREFILLED_SYRINGE | INTRAVENOUS | Status: DC | PRN
  Administered 2023-06-28: 160 ug via INTRAVENOUS

## 2023-06-28 MED ORDER — PROPOFOL 500 MG/50ML IV EMUL
INTRAVENOUS | Status: AC
  Filled 2023-06-28: qty 50

## 2023-06-28 MED ORDER — PROPOFOL 500 MG/50ML IV EMUL
INTRAVENOUS | Status: DC | PRN
  Administered 2023-06-28: 125 ug/kg/min via INTRAVENOUS

## 2023-06-28 MED ORDER — PROPOFOL 10 MG/ML IV BOLUS
INTRAVENOUS | Status: DC | PRN
Start: 2023-06-28 — End: 2023-06-28
  Administered 2023-06-28: 80 mg via INTRAVENOUS

## 2023-06-28 NOTE — H&P (Signed)
@LOGO @   Primary Care Physician:  Ashley Ip, DO Primary Gastroenterologist:  Dr. Jena [name]  Pre-Procedure History & Physical: HPI:  ELLYSIA [name] is a 77 y.o. female here for  for a surveillance colonoscopy distant history of colon polyps and positive family history of colon cancer in first-degree relative but at an advanced age.  Open 1 more surveillance colonoscopy.  Past Medical History:  Diagnosis Date   Coronary artery disease 01/08/2010   STATUS POST STENTING OF THE LAD AND THE FIRST OBTUSE MARGINAL VESSEL   Diverticulitis    Headache(784.0)    Herpes zoster    Hyperlipidemia    Hypertension    Phlebitis Right   Leg   PONV (postoperative nausea and vomiting)    Stomach ulcer     Past Surgical History:  Procedure Laterality Date   ABDOMINAL HYSTERECTOMY     with bladder tack   BREAST BIOPSY Left    Normal   Bunionectomy Right    CARDIAC CATHETERIZATION  01/08/2010   CARDIAC CATHETERIZATION  01/05/2010   NORMAL LEFT VENTRICULAR SIZE AND NORMAL SYSTOLIC FUNCTION. EF IS 60%.    CATARACT EXTRACTION     CATARACT EXTRACTION Bilateral    COLONOSCOPY  07/13/2011   RMR:Lax anal sphincter tone otherwise normal/left sided diverticula, polyp removed but not analyzed   COLONOSCOPY N/A 09/21/2016   Procedure: COLONOSCOPY;  Surgeon: Ashley Ade, MD;  Location: [facility];  Service: Endoscopy;  Laterality: N/A;  1:00 pm   CORONARY STENT PLACEMENT     CYST EXCISION Left 01/27/2023   Procedure: LEFT THUMB MUCOID CYST EXCISION AND DEBRIDEMENT INTERPHALANGEAL JOINT;  Surgeon: Ashley Loa, MD;  Location:  [facility]UR[facility]ERY [facility];  Service: Orthopedics;  Laterality: Left;  30 MIN   VAGINAL HYSTERECTOMY     VEIN LIGATION Bilateral     Prior to Admission medications   Medication Sig Start Date End Date Taking? Authorizing Provider  amLODipine (NORVASC) 2.5 MG tablet Take 1 tablet (2.5 mg total) by mouth daily. 02/13/23  Yes Ashley [name] M, DO  aspirin EC 81 MG tablet  Take 81 mg by mouth at bedtime.   Yes [provider]  mirabegron ER (MYRBETRIQ) 25 MG TB24 tablet Take 1 tablet (25 mg total) by mouth daily. 05/03/23  Yes Ashley Falls, FNP  Na Sulfate-K Sulfate-Mg Sulf 17.5-3.13-1.6 GM/177ML SOLN As directed 05/30/23  Yes Ashley [name], Ashley Friends, MD  Omega-3 Fatty Acids (FISH OIL) 1200 MG CAPS Take 1 capsule by mouth every morning.   Yes [provider]  vitamin B-12 (CYANOCOBALAMIN) 1000 MCG tablet Take 1,000 mcg by mouth daily.   Yes [provider]  amoxicillin-clavulanate (AUGMENTIN) 875-125 MG tablet Take 1 tablet by mouth every 12 (twelve) hours. 06/09/23   Ashley Nearing, PA-C  Calcium Carb-Cholecalciferol (OS-CAL CALCIUM + D3 PO) Take 1 tablet by mouth daily. Calcium 1200 mg with Vitamin D    [provider]  cholecalciferol (VITAMIN D) 1000 units tablet Take 1,000 Units by mouth daily.    [provider]  rosuvastatin (CRESTOR) 40 MG tablet Take 1 tablet (40 mg total) by mouth daily. 11/23/22   Rollene Rotunda, MD    Allergies as of 05/30/2023 - Review Complete 05/03/2023  Allergen Reaction Noted   Sulfa antibiotics Swelling 04/15/2020   Demerol Nausea And Vomiting 04/01/2011   Lisinopril Swelling 04/16/2020   Moviprep [peg-kcl-nacl-nasulf-na asc-c] Nausea And Vomiting 10/04/2012   Declomycin [demeclocycline] Rash 07/23/2013    Family History  Problem Relation Age of Onset  Emphysema Father    Colon cancer Father        51s   Heart disease Sister    Heart disease Maternal Grandmother    Hypertension Maternal Grandmother    Heart disease Paternal Grandmother    Hypertension Paternal Grandmother    Lupus Child     Social History   Socioeconomic History   Marital status: Married    Spouse name: Ashley [name]   Number of children: 5   Years of education: GED   Highest education level: GED or equivalent  Occupational History   Occupation: retired  Tobacco Use   Smoking status: Never    Smokeless tobacco: Never  Vaping Use   Vaping status: Never Used  Substance and Sexual Activity   Alcohol use: Yes    Comment: social-wine   Drug use: No   Sexual activity: Yes    Birth control/protection: Surgical  Other Topics Concern   Not on file  Social History Narrative   One level home with her husband    Husband has dementia and she is sole caregiver   Daughter lives next door   Enjoys line-dancing 1-2x per week and playing pickle ball at [employer]   Social Determinants of Health   Financial Resource Strain: Low Risk  (02/01/2023)   Overall Financial Resource Strain (CARDIA)    Difficulty of Paying Living Expenses: Not hard at all  Food Insecurity: No Food Insecurity (02/01/2023)   Hunger Vital Sign    Worried About Running Out of Food in the Last Year: Never true    Ran Out of Food in the Last Year: Never true  Transportation Needs: No Transportation Needs (02/01/2023)   PRAPARE - Administrator, Civil Service (Medical): No    Lack of Transportation (Non-Medical): No  Physical Activity: Insufficiently Active (02/01/2023)   Exercise Vital Sign    Days of Exercise per Week: 3 days    Minutes of Exercise per Session: 30 min  Stress: No Stress Concern Present (02/01/2023)   [employer] of Occupational Health - Occupational Stress Questionnaire    Feeling of Stress : Not at all  Social Connections: Socially Integrated (02/01/2023)   Social Connection and Isolation Panel [NHANES]    Frequency of Communication with [name] and Family: More than three times a week    Frequency of Social Gatherings with [name] and Family: More than three times a week    Attends Religious Services: More than 4 times per year    Active Member of [employer] or Organizations: Yes    Attends Engineer, structural: More than 4 times per year    Marital Status: Married  Catering manager Violence: Not At Risk (02/01/2023)   Humiliation, Afraid, Rape, and Kick questionnaire     Fear of Current or Ex-Partner: No    Emotionally Abused: No    Physically Abused: No    Sexually Abused: No    Review of Systems: See HPI, otherwise negative ROS  Physical Exam: BP (!) 155/59   Pulse 68   Temp 99.2 F (37.3 C) (Oral)   Resp 20   Ht 5' (1.524 m)   Wt 66.2 kg   SpO2 100%   BMI 28.51 kg/m  General:   Alert,  Well-developed, well-nourished, pleasant and cooperative in NAD Skin:  Intact without significant lesions or rashes. Eyes:  Sclera clear, no icterus.   Conjunctiva pink. Ears:  Normal auditory acuity. Nose:  No deformity, discharge,  or lesions. Mouth:  No  deformity or lesions. Neck:  Supple; no masses or thyromegaly. No significant cervical adenopathy. Lungs:  Clear throughout to auscultation.   No wheezes, crackles, or rhonchi. No acute distress. Heart:  Regular rate and rhythm; no murmurs, clicks, rubs,  or gallops. Abdomen: Non-distended, normal bowel sounds.  Soft and nontender without appreciable mass or hepatosplenomegaly.  Pulses:  Normal pulses noted. Extremities:  Without clubbing or edema.  Impression/Plan:    77 year old lady here for surveillance colonoscopy.  History of colonic adenoma and  positive family history colon cancer.    The risks, benefits, limitations, alternatives and imponderables have been reviewed with the patient. Questions have been answered. All parties are agreeable.       Notice: This dictation was prepared with Dragon dictation along with smaller phrase technology. Any transcriptional errors that result from this process are unintentional and may not be corrected upon review.

## 2023-06-28 NOTE — Transfer of Care (Signed)
Immediate Anesthesia Transfer of Care Note  Patient: Ashley [name]  Procedure(s) Performed: COLONOSCOPY WITH PROPOFOL POLYPECTOMY INTESTINAL  Patient Location: Short Stay  Anesthesia Type:General  Level of Consciousness: awake and patient cooperative  Airway & Oxygen Therapy: Patient Spontanous Breathing and Patient connected to nasal cannula oxygen  Post-op Assessment: Report given to RN and Post -op Vital signs reviewed and stable  Post vital signs: Reviewed and stable  Last Vitals:  Vitals Value Taken Time  BP    Temp    Pulse    Resp    SpO2      Last Pain:  Vitals:   06/28/23 1202  TempSrc: Oral  PainSc: 0-No pain         Complications: No notable events documented.

## 2023-06-28 NOTE — Discharge Instructions (Signed)
  Colonoscopy Discharge Instructions  Read the instructions outlined below and refer to this sheet in the next few weeks. These discharge instructions provide you with general information on caring for yourself after you leave the hospital. Your doctor may also give you specific instructions. While your treatment has been planned according to the most current medical practices available, unavoidable complications occasionally occur. If you have any problems or questions after discharge, call Dr. Jena Gauss at [phone]. ACTIVITY You may resume your regular activity, but move at a slower pace for the next 24 hours.  Take frequent rest periods for the next 24 hours.  Walking will help get rid of the air and reduce the bloated feeling in your belly (abdomen).  No driving for 24 hours (because of the medicine (anesthesia) used during the test).   Do not sign any important legal documents or operate any machinery for 24 hours (because of the anesthesia used during the test).  NUTRITION Drink plenty of fluids.  You may resume your normal diet as instructed by your doctor.  Begin with a light meal and progress to your normal diet. Heavy or fried foods are harder to digest and may make you feel sick to your stomach (nauseated).  Avoid alcoholic beverages for 24 hours or as instructed.  MEDICATIONS You may resume your normal medications unless your doctor tells you otherwise.  WHAT YOU CAN EXPECT TODAY Some feelings of bloating in the abdomen.  Passage of more gas than usual.  Spotting of blood in your stool or on the toilet paper.  IF YOU HAD POLYPS REMOVED DURING THE COLONOSCOPY: No aspirin products for 7 days or as instructed.  No alcohol for 7 days or as instructed.  Eat a soft diet for the next 24 hours.  FINDING OUT THE RESULTS OF YOUR TEST Not all test results are available during your visit. If your test results are not back during the visit, make an appointment with your caregiver to find out the  results. Do not assume everything is normal if you have not heard from your caregiver or the medical facility. It is important for you to follow up on all of your test results.  SEEK IMMEDIATE MEDICAL ATTENTION IF: You have more than a spotting of blood in your stool.  Your belly is swollen (abdominal distention).  You are nauseated or vomiting.  You have a temperature over 101.  You have abdominal pain or discomfort that is severe or gets worse throughout the day.       1 very small polyp found and removed it  Diverticulosis present.  Diverticulosis and colon polyp information provided   further recommendations to follow pending review of pathology report   at patient request, I called Dorothy at [phone] -  reviewed findings and recommendations.

## 2023-06-28 NOTE — Anesthesia Preprocedure Evaluation (Signed)
Anesthesia Evaluation  Patient identified by MRN, date of birth, ID band Patient awake    Reviewed: Allergy & Precautions, H&P , NPO status , Patient's Chart, lab work & pertinent test results  History of Anesthesia Complications (+) PONV and history of anesthetic complications  Airway Mallampati: II  TM Distance: >3 FB Neck ROM: Full    Dental  (+) Dental Advisory Given, Loose,    Pulmonary shortness of breath and with exertion   Pulmonary exam normal breath sounds clear to auscultation       Cardiovascular Exercise Tolerance: Good hypertension, Pt. on medications + CAD and + Cardiac Stents  Normal cardiovascular exam Rhythm:Regular Rate:Normal  Rolly Salter, MD 08/24/2022  9:41 PM EDT   Poor exercise tolerance.  However, there was no suggestion of ischemia and I did not have a strong suspicion of CAD.  No change in therapy.  No further work up.  Call Ms. Braid with the results and send results to Raliegh Ip, DO     Neuro/Psych  Headaches  negative psych ROS   GI/Hepatic Neg liver ROS, PUD, Bowel prep,,,  Endo/Other  negative endocrine ROS    Renal/GU negative Renal ROS  negative genitourinary   Musculoskeletal negative musculoskeletal ROS (+)    Abdominal   Peds negative pediatric ROS (+)  Hematology negative hematology ROS (+)   Anesthesia Other Findings   Reproductive/Obstetrics negative OB ROS                             Anesthesia Physical Anesthesia Plan  ASA: 3  Anesthesia Plan: General   Post-op Pain Management: Minimal or no pain anticipated   Induction: Intravenous  PONV Risk Score and Plan: 1 and Treatment may vary due to age or medical condition and Propofol infusion  Airway Management Planned: Nasal Cannula and Natural Airway  Additional Equipment:   Intra-op Plan:   Post-operative Plan:   Informed Consent: I have reviewed the patients History  and Physical, chart, labs and discussed the procedure including the risks, benefits and alternatives for the proposed anesthesia with the patient or authorized representative who has indicated his/her understanding and acceptance.     Dental advisory given  Plan Discussed with: CRNA and Surgeon  Anesthesia Plan Comments:         Anesthesia Quick Evaluation

## 2023-06-28 NOTE — Op Note (Signed)
[facility] Patient Name: Ashley [name] Procedure Date: 06/28/2023 1:37 PM MRN: [id number] Date of Birth: 09-Oct-1946 Attending MD: Gennette Pac , MD, [id number] CSN: [id number] Age: 77 Admit Type: Outpatient Procedure:                Colonoscopy Indications:              High risk colon cancer surveillance: Personal                            history of colonic polyps Providers:                Gennette Pac, MD, Nena Polio, RN, Pandora Leiter, Technician Referring MD:              Medicines:                Propofol per Anesthesia Complications:            No immediate complications. Estimated Blood Loss:     Estimated blood loss was minimal. Procedure:                Pre-Anesthesia Assessment:                           - Prior to the procedure, a History and Physical                            was performed, and patient medications and                            allergies were reviewed. The patient's tolerance of                            previous anesthesia was also reviewed. The risks                            and benefits of the procedure and the sedation                            options and risks were discussed with the patient.                            All questions were answered, and informed consent                            was obtained. Prior Anticoagulants: The patient has                            taken no anticoagulant or antiplatelet agents. ASA                            Grade Assessment: III - A patient with severe  systemic disease. After reviewing the risks and                            benefits, the patient was deemed in satisfactory                            condition to undergo the procedure.                           After obtaining informed consent, the colonoscope                            was passed under direct vision. Throughout the                            procedure, the  patient's blood pressure, pulse, and                            oxygen saturations were monitored continuously. The                            [id number]) scope was introduced through                            the anus and advanced to the the cecum, identified                            by appendiceal orifice and ileocecal valve. The                            entire colon was well visualized. Scope In: 1:58:13 PM Scope Out: 2:12:45 PM Scope Withdrawal Time: 0 hours 6 minutes 34 seconds  Total Procedure Duration: 0 hours 14 minutes 32 seconds  Findings:      The perianal and digital rectal examinations were normal.      Scattered medium-mouthed diverticula were found in the sigmoid colon and       descending colon.      A 4 mm polyp was found in the hepatic flexure. The polyp was sessile.       The polyp was removed with a cold snare. Resection and retrieval were       complete. Estimated blood loss was minimal.      The exam was otherwise without abnormality. Rectal vault too small to       retroflex. Seen well on?"face. Rectal mucosa appeared normal. Impression:               - Diverticulosis in the sigmoid colon and in the                            descending colon.                           - One 4 mm polyp at the hepatic flexure, removed  with a cold snare. Resected and retrieved.                           - The examination was otherwise normal. Moderate Sedation:      Moderate (conscious) sedation was personally administered by an       anesthesia professional. The following parameters were monitored: oxygen       saturation, heart rate, blood pressure, respiratory rate, EKG, adequacy       of pulmonary ventilation, and response to care. Recommendation:           - Patient has a contact number available for                            emergencies. The signs and symptoms of potential                            delayed complications were  discussed with the                            patient. Return to normal activities tomorrow.                            Written discharge instructions were provided to the                            patient.                           - Advance diet as tolerated.                           - Continue present medications.                           - Repeat colonoscopy date to be determined after                            pending pathology results are reviewed for                            surveillance.                           - Return to GI office (date not yet determined). Procedure Code(s):        --- Professional ---                           [phone], Colonoscopy, flexible; with removal of                            tumor(s), polyp(s), or other lesion(s) by snare                            technique Diagnosis Code(s):        --- Professional ---  Z86.010, Personal history of colonic polyps                           D12.3, Benign neoplasm of transverse colon (hepatic                            flexure or splenic flexure)                           K57.30, Diverticulosis of large intestine without                            perforation or abscess without bleeding CPT copyright 2022 American Medical Association. All rights reserved. The codes documented in this report are preliminary and upon coder review may  be revised to meet current compliance requirements. Gerrit Friends. Thoren Hosang, MD Gennette Pac, MD 06/28/2023 2:23:07 PM This report has been signed electronically. Number of Addenda: 0

## 2023-06-28 NOTE — Anesthesia Postprocedure Evaluation (Signed)
Anesthesia Post Note  Patient: Ashley [name]  Procedure(s) Performed: COLONOSCOPY WITH PROPOFOL POLYPECTOMY INTESTINAL  Patient location during evaluation: Phase II Anesthesia Type: General Level of consciousness: awake and alert and oriented Pain management: pain level controlled Vital Signs Assessment: post-procedure vital signs reviewed and stable Respiratory status: spontaneous breathing, nonlabored ventilation and respiratory function stable Cardiovascular status: blood pressure returned to baseline and stable Postop Assessment: no apparent nausea or vomiting Anesthetic complications: no  No notable events documented.   Last Vitals:  Vitals:   06/28/23 1202 06/28/23 1415  BP: (!) 155/59 (!) 128/41  Pulse: 68 70  Resp: 20 (!) 6  Temp: 37.3 C 36.6 C  SpO2: 100% 100%    Last Pain:  Vitals:   06/28/23 1415  TempSrc: Oral  PainSc: 0-No pain                 Adin Laker C Shaneka Efaw

## 2023-06-30 LAB — SURGICAL PATHOLOGY

## 2023-07-02 ENCOUNTER — Encounter: Payer: Self-pay | Admitting: Internal Medicine

## 2023-07-03 ENCOUNTER — Ambulatory Visit: Payer: Medicare HMO | Admitting: Urology

## 2023-07-04 ENCOUNTER — Encounter (HOSPITAL_COMMUNITY): Payer: Self-pay | Admitting: Internal Medicine

## 2023-07-06 DIAGNOSIS — N3941 Urge incontinence: Secondary | ICD-10-CM | POA: Insufficient documentation

## 2023-07-06 DIAGNOSIS — R350 Frequency of micturition: Secondary | ICD-10-CM | POA: Insufficient documentation

## 2023-07-06 DIAGNOSIS — R3915 Urgency of urination: Secondary | ICD-10-CM | POA: Insufficient documentation

## 2023-07-06 HISTORY — DX: Urge incontinence: N39.41

## 2023-07-06 NOTE — Progress Notes (Signed)
Name: Ashley [name] DOB: Sep 20, 1946 MRN: [id number]  History of Present Illness: Ms. Ashley [name] is a 77 y.o. female who presents today as a new patient at [facility]. All available relevant medical records have been reviewed. She is a somewhat questionable historian; accompanied by her daughter Ashley [name]. - GU History: 1. OAB with urinary frequency, nocturia, urgency, and urge incontinence. - Per PCP note on 02/13/2023, patient has failed OAB-type medication trials in the past. She wears Depends. 2. Stress urinary incontinence. She reports distant history of a "bladder tack" procedure at the time of her hysterectomy.  3. Vaginal atrophy. Denies current use of any topical vaginal estrogen.  4. Fecal incontinence. Followed by GI.   At initial visit on 05/03/2023: - Reported worsening OAB symptoms. Voiding every 30-90 minutes during the day; wears 3 Depends per day.  - The plan was:  Minimize caffeine intake. Work on timed voiding / bladder retraining. Start Myrbetriq 25 mg daily.  Today: She reports significant symptomatic improvement since starting Myrbetriq (Mirabegron) 25 mg daily. She is no longer having to go nearly as often during the day and denies nocturia. Reports near complete resolution of her urinary incontinence; continues to wear a diaper just in case. Denies doing timed voiding.   She denies dysuria, gross hematuria, straining to void, or sensations of incomplete emptying.  She denies prior history of gross hematuria.  She denies history of kidney stones.  She denies history of recent or recurrent UTI. She denies history of GU malignancy or pelvic radiation.  She denies history of autoimmune disease. She denies history of smoking. She denies known occupational risks. She reports taking anticoagulants (Aspirin 81 mg daily).   Fall Screening: Do you usually have a device to assist in your mobility? No   Medications: Current Outpatient Medications   Medication Sig Dispense Refill   amLODipine (NORVASC) 2.5 MG tablet Take 1 tablet (2.5 mg total) by mouth daily. 90 tablet 3   amoxicillin-clavulanate (AUGMENTIN) 875-125 MG tablet Take 1 tablet by mouth every 12 (twelve) hours. 14 tablet 0   aspirin EC 81 MG tablet Take 81 mg by mouth at bedtime.     Calcium Carb-Cholecalciferol (OS-CAL CALCIUM + D3 PO) Take 1 tablet by mouth daily. Calcium 1200 mg with Vitamin D     cholecalciferol (VITAMIN D) 1000 units tablet Take 1,000 Units by mouth daily.     Na Sulfate-K Sulfate-Mg Sulf 17.5-3.13-1.6 GM/177ML SOLN As directed 354 mL 0   Omega-3 Fatty Acids (FISH OIL) 1200 MG CAPS Take 1 capsule by mouth every morning.     rosuvastatin (CRESTOR) 40 MG tablet Take 1 tablet (40 mg total) by mouth daily. 90 tablet 3   vitamin B-12 (CYANOCOBALAMIN) 1000 MCG tablet Take 1,000 mcg by mouth daily.     mirabegron ER (MYRBETRIQ) 25 MG TB24 tablet Take 1 tablet (25 mg total) by mouth daily. 90 tablet 3   No current facility-administered medications for this visit.    Allergies: Allergies  Allergen Reactions   Sulfa Antibiotics Swelling    Angoedema   Demerol Nausea And Vomiting   Lisinopril Swelling    angioedema . Uncertain if due to Sulfa or from ACE-I but ACE-I was discontinued to be safe.   Moviprep [Peg-Kcl-Nacl-Nasulf-Na Asc-C] Nausea And Vomiting   Declomycin [Demeclocycline] Rash    Past Medical History:  Diagnosis Date   Coronary artery disease 01/08/2010   STATUS POST STENTING OF THE LAD AND THE FIRST OBTUSE MARGINAL VESSEL   Diverticulitis  Headache(784.0)    Herpes zoster    Hyperlipidemia    Hypertension    Phlebitis Right   Leg   PONV (postoperative nausea and vomiting)    Stomach ulcer    Past Surgical History:  Procedure Laterality Date   ABDOMINAL HYSTERECTOMY     with bladder tack   BREAST BIOPSY Left    Normal   Bunionectomy Right    CARDIAC CATHETERIZATION  01/08/2010   CARDIAC CATHETERIZATION  01/05/2010    NORMAL LEFT VENTRICULAR SIZE AND NORMAL SYSTOLIC FUNCTION. EF IS 60%.    CATARACT EXTRACTION     CATARACT EXTRACTION Bilateral    COLONOSCOPY  07/13/2011   RMR:Lax anal sphincter tone otherwise normal/left sided diverticula, polyp removed but not analyzed   COLONOSCOPY N/A 09/21/2016   Procedure: COLONOSCOPY;  Surgeon: Corbin Ade, MD;  [facility];  Service: Endoscopy;  Laterality: N/A;  1:00 pm   COLONOSCOPY WITH PROPOFOL N/A 06/28/2023   Procedure: COLONOSCOPY WITH PROPOFOL;  Surgeon: Corbin Ade, MD;  [facility];  Service: Endoscopy;  Laterality: N/A;  215pm, asa 3, pt knows to arrive at 11:05   CORONARY STENT PLACEMENT     CYST EXCISION Left 01/27/2023   Procedure: LEFT THUMB MUCOID CYST EXCISION AND DEBRIDEMENT INTERPHALANGEAL JOINT;  Surgeon: Betha Loa, MD;  Location: [facility];  Service: Orthopedics;  Laterality: Left;  30 MIN   POLYPECTOMY  06/28/2023   Procedure: POLYPECTOMY INTESTINAL;  Surgeon: Corbin Ade, MD;  [facility];  Service: Endoscopy;;   VAGINAL HYSTERECTOMY     VEIN LIGATION Bilateral    Family History  Problem Relation Age of Onset   Emphysema Father    Colon cancer Father        53s   Heart disease Sister    Heart disease Maternal Grandmother    Hypertension Maternal Grandmother    Heart disease Paternal Grandmother    Hypertension Paternal Grandmother    Lupus Child    Social History   Socioeconomic History   Marital status: Married    Spouse name: Earvin Hansen   Number of children: 5   Years of education: GED   Highest education level: GED or equivalent  Occupational History   Occupation: retired  Tobacco Use   Smoking status: Never   Smokeless tobacco: Never  Vaping Use   Vaping status: Never Used  Substance and Sexual Activity   Alcohol use: Yes    Comment: social-wine   Drug use: No   Sexual activity: Yes    Birth control/protection: Surgical  Other Topics Concern   Not on file   Social History Narrative   One level home with her husband    Husband has dementia and she is sole caregiver   Daughter lives next door   Enjoys line-dancing 1-2x per week and playing pickle ball at [employer]   Social Determinants of Health   Financial Resource Strain: Low Risk  (02/01/2023)   Overall Financial Resource Strain (CARDIA)    Difficulty of Paying Living Expenses: Not hard at all  Food Insecurity: No Food Insecurity (02/01/2023)   Hunger Vital Sign    Worried About Running Out of Food in the Last Year: Never true    Ran Out of Food in the Last Year: Never true  Transportation Needs: No Transportation Needs (02/01/2023)   PRAPARE - Administrator, Civil Service (Medical): No    Lack of Transportation (Non-Medical): No  Physical Activity:  Insufficiently Active (02/01/2023)   Exercise Vital Sign    Days of Exercise per Week: 3 days    Minutes of Exercise per Session: 30 min  Stress: No Stress Concern Present (02/01/2023)   [employer] of Occupational Health - Occupational Stress Questionnaire    Feeling of Stress : Not at all  Social Connections: Socially Integrated (02/01/2023)   Social Connection and Isolation Panel [NHANES]    Frequency of Communication with Friends and Family: More than three times a week    Frequency of Social Gatherings with Friends and Family: More than three times a week    Attends Religious Services: More than 4 times per year    Active Member of [employer] or Organizations: Yes    Attends Engineer, structural: More than 4 times per year    Marital Status: Married  Catering manager Violence: Not At Risk (02/01/2023)   Humiliation, Afraid, Rape, and Kick questionnaire    Fear of Current or Ex-Partner: No    Emotionally Abused: No    Physically Abused: No    Sexually Abused: No    SUBJECTIVE  Review of Systems Constitutional: Patient denies any unintentional weight loss or change in strength lntegumentary: Patient denies  any rashes or pruritus Cardiovascular: Patient denies chest pain or syncope Respiratory: Patient denies shortness of breath Gastrointestinal: Patient denies nausea, vomiting, constipation, or diarrhea Musculoskeletal: Patient denies muscle cramps or weakness Neurologic: Patient denies convulsions or seizures Psychiatric: Patient acknowledges some memory problems Allergic/Immunologic: Patient denies recent allergic reaction(s) Hematologic/Lymphatic: Patient denies bleeding tendencies Endocrine: Patient denies heat/cold intolerance  GU: As per HPI.  OBJECTIVE Vitals:   07/10/23 1120  BP: (!) 166/69  Pulse: 76  Temp: 98.1 F (36.7 C)   There is no height or weight on file to calculate BMI.  Physical Examination  Constitutional: No obvious distress; patient is non-toxic appearing  Cardiovascular: No visible lower extremity edema.  Respiratory: The patient does not have audible wheezing/stridor; respirations do not appear labored  Gastrointestinal: Abdomen non-distended Musculoskeletal: Normal ROM of UEs  Skin: No obvious rashes/open sores  Neurologic: CN 2-12 grossly intact Psychiatric: Answered questions appropriately with normal affect  Hematologic/Lymphatic/Immunologic: No obvious bruises or sites of spontaneous bleeding  UA: 6-10 WBC/hpf, 3-10 RBC/hpf, bacteria (moderate) PVR: 0 ml  ASSESSMENT Urinary urgency - Plan: Urinalysis, Routine w reflex microscopic, BLADDER SCAN AMB NON-IMAGING, mirabegron ER (MYRBETRIQ) 25 MG TB24 tablet  Urinary frequency - Plan: Urinalysis, Routine w reflex microscopic, BLADDER SCAN AMB NON-IMAGING, mirabegron ER (MYRBETRIQ) 25 MG TB24 tablet  Urge incontinence - Plan: Urinalysis, Routine w reflex microscopic, BLADDER SCAN AMB NON-IMAGING, mirabegron ER (MYRBETRIQ) 25 MG TB24 tablet  Postmenopausal atrophic vaginitis  Microscopic hematuria - Plan: CT ABDOMEN PELVIS W WO CONTRAST  OAB (overactive bladder) - Plan: mirabegron ER (MYRBETRIQ) 25  MG TB24 tablet  1. OAB with urinary frequency, nocturia, urgency, and urge incontinence. Well managed with Myrbetriq; will continue that. Refills sent.   2. Vaginal atrophy.  3. Asymptomatic bacteriuria & microscopic hematuria.  We discussed possible vaginal contamination of urine specimens, however we agreed to proceed with further evaluation based on the AUA 2020 [facility] guideline and risk stratification for this patient. Based on individual risk factors, pt was advised that the recommended workup includes CT urogram and cystoscopy. Pt decided to pursue this work-up and follow-up afterward to discuss the results and formulate a treatment plan based on the findings. All questions were answered.   PLAN Advised the following: 1. Continue Myrbetriq 25 mg  daily. 2. CT.  3. Return for 1st available cystoscopy with any urology MD.  Orders Placed This Encounter  Procedures   CT ABDOMEN PELVIS W WO CONTRAST    Standing Status:   Future    Standing Expiration Date:   07/09/2024    Order Specific Question:   If indicated for the ordered procedure, I authorize the administration of contrast media per Radiology protocol    Answer:   Yes    Order Specific Question:   Does the patient have a contrast media/X-ray dye allergy?    Answer:   No    Order Specific Question:   Preferred imaging location?    Answer:   [facility]    Order Specific Question:   If indicated for the ordered procedure, I authorize the administration of oral contrast media per Radiology protocol    Answer:   Yes   Urinalysis, Routine w reflex microscopic   BLADDER SCAN AMB NON-IMAGING    It has been explained that the patient is to follow regularly with their PCP in addition to all other providers involved in their care and to follow instructions provided by these respective offices. Patient advised to contact urology clinic if any urologic-pertaining questions, concerns, new symptoms or problems arise in the interim  period.  There are no Patient Instructions on file for this visit.  Electronically signed by:  Donnita Falls, MSN, FNP-C, CUNP 07/10/2023 12:07 PM

## 2023-07-10 ENCOUNTER — Encounter: Payer: Self-pay | Admitting: Urology

## 2023-07-10 ENCOUNTER — Ambulatory Visit: Payer: Medicare HMO | Admitting: Urology

## 2023-07-10 VITALS — BP 166/69 | HR 76 | Temp 98.1°F

## 2023-07-10 DIAGNOSIS — N3281 Overactive bladder: Secondary | ICD-10-CM

## 2023-07-10 DIAGNOSIS — R3129 Other microscopic hematuria: Secondary | ICD-10-CM

## 2023-07-10 DIAGNOSIS — R350 Frequency of micturition: Secondary | ICD-10-CM

## 2023-07-10 DIAGNOSIS — R3915 Urgency of urination: Secondary | ICD-10-CM

## 2023-07-10 DIAGNOSIS — N952 Postmenopausal atrophic vaginitis: Secondary | ICD-10-CM

## 2023-07-10 DIAGNOSIS — N3941 Urge incontinence: Secondary | ICD-10-CM | POA: Diagnosis not present

## 2023-07-10 LAB — URINALYSIS, ROUTINE W REFLEX MICROSCOPIC
Bilirubin, UA: NEGATIVE
Glucose, UA: NEGATIVE
Ketones, UA: NEGATIVE
Nitrite, UA: NEGATIVE
Protein,UA: NEGATIVE
Specific Gravity, UA: <1.005 — ABNORMAL LOW (ref 1.005–1.030)
Urobilinogen, Ur: 0.2 mg/dL (ref 0.2–1.0)
pH, UA: 7 (ref 5.0–7.5)

## 2023-07-10 LAB — BLADDER SCAN AMB NON-IMAGING: Scan Result: 11

## 2023-07-10 LAB — MICROSCOPIC EXAMINATION

## 2023-07-10 MED ORDER — MIRABEGRON ER 25 MG PO TB24
25.0000 mg | ORAL_TABLET | Freq: Every day | ORAL | 3 refills | Status: DC
Start: 2023-07-10 — End: 2024-07-18

## 2023-07-11 ENCOUNTER — Other Ambulatory Visit: Payer: Self-pay | Admitting: Family Medicine

## 2023-07-11 DIAGNOSIS — Z1231 Encounter for screening mammogram for malignant neoplasm of breast: Secondary | ICD-10-CM

## 2023-07-12 ENCOUNTER — Ambulatory Visit
Admission: RE | Admit: 2023-07-12 | Discharge: 2023-07-12 | Disposition: A | Discharge status: Home / Self Care | Payer: Medicare HMO | Source: Ambulatory Visit | Attending: Family Medicine | Admitting: Family Medicine

## 2023-07-12 DIAGNOSIS — Z1231 Encounter for screening mammogram for malignant neoplasm of breast: Secondary | ICD-10-CM

## 2023-08-15 ENCOUNTER — Ambulatory Visit: Payer: Medicare HMO | Admitting: Family Medicine

## 2023-08-15 ENCOUNTER — Encounter: Payer: Self-pay | Admitting: Family Medicine

## 2023-08-15 VITALS — BP 123/61 | HR 70 | Temp 98.8°F | Ht 60.0 in | Wt 146.0 lb

## 2023-08-15 DIAGNOSIS — I2583 Coronary atherosclerosis due to lipid rich plaque: Secondary | ICD-10-CM

## 2023-08-15 DIAGNOSIS — Z0000 Encounter for general adult medical examination without abnormal findings: Secondary | ICD-10-CM

## 2023-08-15 DIAGNOSIS — I10 Essential (primary) hypertension: Secondary | ICD-10-CM | POA: Diagnosis not present

## 2023-08-15 DIAGNOSIS — I2510 Atherosclerotic heart disease of native coronary artery without angina pectoris: Secondary | ICD-10-CM

## 2023-08-15 DIAGNOSIS — E7800 Pure hypercholesterolemia, unspecified: Secondary | ICD-10-CM

## 2023-08-15 DIAGNOSIS — M8580 Other specified disorders of bone density and structure, unspecified site: Secondary | ICD-10-CM

## 2023-08-15 DIAGNOSIS — Z0001 Encounter for general adult medical examination with abnormal findings: Secondary | ICD-10-CM

## 2023-08-15 HISTORY — DX: Other specified disorders of bone density and structure, unspecified site: M85.80

## 2023-08-15 NOTE — Progress Notes (Signed)
Ashley [name] is a 77 y.o. female presents to office today for annual physical exam examination.    Concerns today include: 1. None. She reports edema is stable with compression hose.  Continues to have issues with incontinence. Seeing urology for this. Myrbetriq helping with pm symptoms. Has Cysto scheduled tomorrow with Dr Retta Diones.   Occupation: retired, Marital status: married, Substance use: none There are no preventive care reminders to display for this patient.  Refills needed today: none  Immunization History  Administered Date(s) Administered   Fluad Quad(high Dose 65+) 07/24/2020   Influenza-Unspecified 08/16/2018, 08/08/2021   Pneumococcal Conjugate-13 05/04/2020   Pneumococcal Polysaccharide-23 08/18/2016   Tdap 02/13/2023   Zoster Recombinant(Shingrix) 11/17/2021   Past Medical History:  Diagnosis Date   Coronary artery disease 01/08/2010   STATUS POST STENTING OF THE LAD AND THE FIRST OBTUSE MARGINAL VESSEL   Diverticulitis    Headache(784.0)    Herpes zoster    Hyperlipidemia    Hypertension    Phlebitis Right   Leg   PONV (postoperative nausea and vomiting)    Stomach ulcer    Social History   Socioeconomic History   Marital status: Married    Spouse name: Earvin Hansen   Number of children: 5   Years of education: GED   Highest education level: GED or equivalent  Occupational History   Occupation: retired  Tobacco Use   Smoking status: Never   Smokeless tobacco: Never  Vaping Use   Vaping status: Never Used  Substance and Sexual Activity   Alcohol use: Yes    Comment: social-wine   Drug use: No   Sexual activity: Yes    Birth control/protection: Surgical  Other Topics Concern   Not on file  Social History Narrative   One level home with her husband    Husband has dementia and she is sole caregiver   Daughter lives next door   Enjoys line-dancing 1-2x per week and playing pickle ball at [employer] center   Social Determinants of Health    Financial Resource Strain: Low Risk  (02/01/2023)   Overall Financial Resource Strain (CARDIA)    Difficulty of Paying Living Expenses: Not hard at all  Food Insecurity: No Food Insecurity (02/01/2023)   Hunger Vital Sign    Worried About Running Out of Food in the Last Year: Never true    Ran Out of Food in the Last Year: Never true  Transportation Needs: No Transportation Needs (02/01/2023)   PRAPARE - Administrator, Civil Service (Medical): No    Lack of Transportation (Non-Medical): No  Physical Activity: Insufficiently Active (02/01/2023)   Exercise Vital Sign    Days of Exercise per Week: 3 days    Minutes of Exercise per Session: 30 min  Stress: No Stress Concern Present (02/01/2023)   [employer] of Occupational Health - Occupational Stress Questionnaire    Feeling of Stress : Not at all  Social Connections: Socially Integrated (02/01/2023)   Social Connection and Isolation Panel [NHANES]    Frequency of Communication with Friends and Family: More than three times a week    Frequency of Social Gatherings with Friends and Family: More than three times a week    Attends Religious Services: More than 4 times per year    Active Member of [employer] or Organizations: Yes    Attends Banker Meetings: More than 4 times per year    Marital Status: Married  Catering manager Violence: Not At Risk (02/01/2023)  Humiliation, Afraid, Rape, and Kick questionnaire    Fear of Current or Ex-Partner: No    Emotionally Abused: No    Physically Abused: No    Sexually Abused: No   Past Surgical History:  Procedure Laterality Date   ABDOMINAL HYSTERECTOMY     with bladder tack   BREAST BIOPSY Left    Normal   Bunionectomy Right    CARDIAC CATHETERIZATION  01/08/2010   CARDIAC CATHETERIZATION  01/05/2010   NORMAL LEFT VENTRICULAR SIZE AND NORMAL SYSTOLIC FUNCTION. EF IS 60%.    CATARACT EXTRACTION     CATARACT EXTRACTION Bilateral    COLONOSCOPY  07/13/2011    RMR:Lax anal sphincter tone otherwise normal/left sided diverticula, polyp removed but not analyzed   COLONOSCOPY N/A 09/21/2016   Procedure: COLONOSCOPY;  Surgeon: Corbin Ade, MD;  [facility];  Service: Endoscopy;  Laterality: N/A;  1:00 pm   COLONOSCOPY WITH PROPOFOL N/A 06/28/2023   Procedure: COLONOSCOPY WITH PROPOFOL;  Surgeon: Corbin Ade, MD;  [facility];  Service: Endoscopy;  Laterality: N/A;  215pm, asa 3, pt knows to arrive at 11:05   CORONARY STENT PLACEMENT     CYST EXCISION Left 01/27/2023   Procedure: LEFT THUMB MUCOID CYST EXCISION AND DEBRIDEMENT INTERPHALANGEAL JOINT;  Surgeon: Betha Loa, MD;  Location: [facility];  Service: Orthopedics;  Laterality: Left;  30 MIN   POLYPECTOMY  06/28/2023   Procedure: POLYPECTOMY INTESTINAL;  Surgeon: Corbin Ade, MD;  [facility];  Service: Endoscopy;;   VAGINAL HYSTERECTOMY     VEIN LIGATION Bilateral    Family History  Problem Relation Age of Onset   Emphysema Father    Colon cancer Father        90s   Heart disease Sister    Heart disease Maternal Grandmother    Hypertension Maternal Grandmother    Heart disease Paternal Grandmother    Hypertension Paternal Grandmother    Lupus Child    Breast cancer Neg Hx     Current Outpatient Medications:    amLODipine (NORVASC) 2.5 MG tablet, Take 1 tablet (2.5 mg total) by mouth daily., Disp: 90 tablet, Rfl: 3   aspirin EC 81 MG tablet, Take 81 mg by mouth at bedtime., Disp: , Rfl:    Calcium Carb-Cholecalciferol (OS-CAL CALCIUM + D3 PO), Take 1 tablet by mouth daily. Calcium 1200 mg with Vitamin D, Disp: , Rfl:    cholecalciferol (VITAMIN D) 1000 units tablet, Take 1,000 Units by mouth daily., Disp: , Rfl:    mirabegron ER (MYRBETRIQ) 25 MG TB24 tablet, Take 1 tablet (25 mg total) by mouth daily., Disp: 90 tablet, Rfl: 3   Na Sulfate-K Sulfate-Mg Sulf 17.5-3.13-1.6 GM/177ML SOLN, As directed, Disp: 354 mL, Rfl: 0   Omega-3  Fatty Acids (FISH OIL) 1200 MG CAPS, Take 1 capsule by mouth every morning., Disp: , Rfl:    rosuvastatin (CRESTOR) 40 MG tablet, Take 1 tablet (40 mg total) by mouth daily., Disp: 90 tablet, Rfl: 3   vitamin B-12 (CYANOCOBALAMIN) 1000 MCG tablet, Take 1,000 mcg by mouth daily., Disp: , Rfl:   Allergies  Allergen Reactions   Sulfa Antibiotics Swelling    Angoedema   Demerol Nausea And Vomiting   Lisinopril Swelling    angioedema . Uncertain if due to Sulfa or from ACE-I but ACE-I was discontinued to be safe.   Moviprep [Peg-Kcl-Nacl-Nasulf-Na Asc-C] Nausea And Vomiting   Declomycin [Demeclocycline] Rash     ROS: Review  of Systems Pertinent items noted in HPI and remainder of comprehensive ROS otherwise negative.    Physical exam BP 123/61   Pulse 70   Temp 98.8 F (37.1 C)   Ht 5' (1.524 m)   Wt 146 lb (66.2 kg)   SpO2 96%   BMI 28.51 kg/m  General appearance: alert, cooperative, appears stated age, and no distress Head: Normocephalic, without obvious abnormality, atraumatic Eyes: negative findings: lids and lashes normal, conjunctivae and sclerae normal, corneas clear, and pupils equal, round, reactive to light and accomodation Ears: normal TM's and external ear canals both ears Nose: Nares normal. Septum midline. Mucosa normal. No drainage or sinus tenderness. Throat: lips, mucosa, and tongue normal; teeth and gums normal Neck: no adenopathy, supple, symmetrical, trachea midline, and thyroid not enlarged, symmetric, no tenderness/mass/nodules Back: slight rotation of thoracics to right. ROM normal. No CVA tenderness. Lungs: clear to auscultation bilaterally Heart: regular rate and rhythm, S1, S2 normal, no murmur, click, rub or gallop Abdomen: soft, non-tender; bowel sounds normal; no masses,  no organomegaly Extremities: extremities normal, atraumatic, no cyanosis or edema Pulses: 2+ and symmetric Skin: Skin color, texture, turgor normal. No rashes or lesions Lymph  nodes: Cervical, supraclavicular, and axillary nodes normal. Neurologic: Grossly normal      08/15/2023    1:25 PM 02/01/2023    8:24 AM 06/14/2022    2:15 PM  Depression screen PHQ 2/9  Decreased Interest 0 0 0  Down, Depressed, Hopeless 0 0 0  PHQ - 2 Score 0 0 0  Altered sleeping 0    Tired, decreased energy 0    Change in appetite 0    Feeling bad or failure about yourself  0    Trouble concentrating 0    Moving slowly or fidgety/restless 0    Suicidal thoughts 0    PHQ-9 Score 0    Difficult doing work/chores Not difficult at all        08/15/2023    1:25 PM 05/23/2022    1:37 PM 11/17/2021    2:33 PM 06/22/2021   10:46 AM  GAD 7 : Generalized Anxiety Score  Nervous, Anxious, on Edge 0 0 0 0  Control/stop worrying 0 0 0 0  Worry too much - different things 0 0 0 0  Trouble relaxing 0 0 0 0  Restless 0 0 0 0  Easily annoyed or irritable 0 0 0 0  Afraid - awful might happen 0 0 0 0  Total GAD 7 Score 0 0 0 0  Anxiety Difficulty Not difficult at all Not difficult at all Not difficult at all Not difficult at all     Assessment/ Plan: [name] here for annual physical exam.   Annual physical exam  Essential hypertension - Plan: CMP14+EGFR  Hypercholesterolemia - Plan: CMP14+EGFR  Coronary artery disease due to lipid rich plaque - Plan: CMP14+EGFR, CBC  Osteopenia with high risk of fracture - Plan: CMP14+EGFR, VITAMIN D 25 Hydroxy (Vit-D Deficiency, Fractures)  She declined vaccination today.  Blood pressure well-controlled through. no changes needed  Not due for fasting lipid.  Had done in April and was normal.  Continue current regimen as outlined by specialist  Check liver enzymes, CBC, vitamin D given history of osteopenia with high risk of fracture.  Continue vitamin D, calcium supplementation as directed  Counseled on healthy lifestyle choices, including diet (rich in fruits, vegetables and lean meats and low in salt and simple carbohydrates) and  exercise (at least 30 minutes  of moderate physical activity daily).  Patient to follow up 38m  Neira Bentsen M. Nadine Counts, DO

## 2023-08-15 NOTE — Patient Instructions (Signed)
Edema  Edema is when you have too much fluid in your body or under your skin. Edema may make your legs, feet, and ankles swell. Swelling often happens in looser tissues, such as around your eyes. This is a common condition. It gets more common as you get older. There are many possible causes of edema. These include: Eating too much salt (sodium). Being on your feet or sitting for a long time. Certain medical conditions, such as: Pregnancy. Heart failure. Liver disease. Kidney disease. Cancer. Hot weather may make edema worse. Edema is usually painless. Your skin may look swollen or shiny. Follow these instructions at home: Medicines Take over-the-counter and prescription medicines only as told by your doctor. Your doctor may prescribe a medicine to help your body get rid of extra water (diuretic). Take this medicine if you are told to take it. Eating and drinking Eat a low-salt (low-sodium) diet as told by your doctor. Sometimes, eating less salt may reduce swelling. Depending on the cause of your swelling, you may need to limit how much fluid you drink (fluid restriction). General instructions Raise the injured area above the level of your heart while you are sitting or lying down. Do not sit still or stand for a long time. Do not wear tight clothes. Do not wear garters on your upper legs. Exercise your legs. This can help the swelling go down. Wear compression stockings as told by your doctor. It is important that these are the right size. These should be prescribed by your doctor to prevent possible injuries. If elastic bandages or wraps are recommended, use them as told by your doctor. Contact a doctor if: Treatment is not working. You have heart, liver, or kidney disease and have symptoms of edema. You have sudden and unexplained weight gain. Get help right away if: You have shortness of breath or chest pain. You cannot breathe when you lie down. You have pain, redness, or  warmth in the swollen areas. You have heart, liver, or kidney disease and get edema all of a sudden. You have a fever and your symptoms get worse all of a sudden. These symptoms may be an emergency. Get help right away. Call 911. Do not wait to see if the symptoms will go away. Do not drive yourself to the hospital. Summary Edema is when you have too much fluid in your body or under your skin. Edema may make your legs, feet, and ankles swell. Swelling often happens in looser tissues, such as around your eyes. Raise the injured area above the level of your heart while you are sitting or lying down. Follow your doctor's instructions about diet and how much fluid you can drink. This information is not intended to replace advice given to you by your health care provider. Make sure you discuss any questions you have with your health care provider. Document Revised: 07/05/2021 Document Reviewed: 07/05/2021 Elsevier Patient Education  2024 [employer].

## 2023-08-15 NOTE — Progress Notes (Incomplete)
History of Present Illness: Ashley [name] is a 77 y.o. year old female w/ OAB sx's as well as microscopic hematuria. She presents for cystoscopy.  Past Medical History:  Diagnosis Date   Coronary artery disease 01/08/2010   STATUS POST STENTING OF THE LAD AND THE FIRST OBTUSE MARGINAL VESSEL   Diverticulitis    Headache(784.0)    Herpes zoster    Hyperlipidemia    Hypertension    Phlebitis Right   Leg   PONV (postoperative nausea and vomiting)    Stomach ulcer     Past Surgical History:  Procedure Laterality Date   ABDOMINAL HYSTERECTOMY     with bladder tack   BREAST BIOPSY Left    Normal   Bunionectomy Right    CARDIAC CATHETERIZATION  01/08/2010   CARDIAC CATHETERIZATION  01/05/2010   NORMAL LEFT VENTRICULAR SIZE AND NORMAL SYSTOLIC FUNCTION. EF IS 60%.    CATARACT EXTRACTION     CATARACT EXTRACTION Bilateral    COLONOSCOPY  07/13/2011   RMR:Lax anal sphincter tone otherwise normal/left sided diverticula, polyp removed but not analyzed   COLONOSCOPY N/A 09/21/2016   Procedure: COLONOSCOPY;  Surgeon: Corbin Ade, MD;  [facility]: [facility];  Service: Endoscopy;  Laterality: N/A;  1:00 pm   COLONOSCOPY WITH PROPOFOL N/A 06/28/2023   Procedure: COLONOSCOPY WITH PROPOFOL;  Surgeon: Corbin Ade, MD;  Location: [facility];  Service: Endoscopy;  Laterality: N/A;  215pm, asa 3, pt knows to arrive at 11:05   CORONARY STENT PLACEMENT     CYST EXCISION Left 01/27/2023   Procedure: LEFT THUMB MUCOID CYST EXCISION AND DEBRIDEMENT INTERPHALANGEAL JOINT;  Surgeon: Betha Loa, MD;  Location: [facility];  Service: Orthopedics;  Laterality: Left;  30 MIN   POLYPECTOMY  06/28/2023   Procedure: POLYPECTOMY INTESTINAL;  Surgeon: Corbin Ade, MD;  Location: [facility];  Service: Endoscopy;;   VAGINAL HYSTERECTOMY     VEIN LIGATION Bilateral     Home Medications:  (Not in a hospital admission)   Allergies:  Allergies  Allergen Reactions   Sulfa  Antibiotics Swelling    Angoedema   Demerol Nausea And Vomiting   Lisinopril Swelling    angioedema . Uncertain if due to Sulfa or from ACE-I but ACE-I was discontinued to be safe.   Moviprep [Peg-Kcl-Nacl-Nasulf-Na Asc-C] Nausea And Vomiting   Declomycin [Demeclocycline] Rash    Family History  Problem Relation Age of Onset   Emphysema Father    Colon cancer Father        36s   Heart disease Sister    Heart disease Maternal Grandmother    Hypertension Maternal Grandmother    Heart disease Paternal Grandmother    Hypertension Paternal Grandmother    Lupus Child    Breast cancer Neg Hx     Social History:  reports that she has never smoked. She has never used smokeless tobacco. She reports current alcohol use. She reports that she does not use drugs.  ROS: A complete review of systems was performed.  All systems are negative except for pertinent findings as noted.  Physical Exam:  Vital signs in last 24 hours: @VSRANGES @ General:  Alert and oriented, No acute distress HEENT: Normocephalic, atraumatic Neck: No JVD or lymphadenopathy Cardiovascular: Regular rate  Lungs: Normal inspiratory/expiratory excursion Abdomen: Soft, nontender, nondistended, no abdominal masses Back: No CVA tenderness Extremities: No edema Neurologic: Grossly intact  I have reviewed prior pt notes  I have reviewed notes from referring/previous physicians  I have reviewed urinalysis results  I have independently reviewed prior imaging  I have reviewed prior urine culture    Cystoscopy Procedure Note:  Indication: Microscopic hematuria  After informed consent and discussion of the procedure and its risks, Ashley [name] was positioned and prepped in the standard fashion.  Cystoscopy was performed with a flexible cystoscope.   Findings: Urethra:*** Ureteral orifices:*** Bladder:***  The patient tolerated the procedure well.     Impression/Assessment:  ***  Plan:  Ashley [name] 08/15/2023, 10:41 AM  Ashley [name]. Ashley Eisenhuth MD

## 2023-08-16 ENCOUNTER — Encounter: Payer: Self-pay | Admitting: Urology

## 2023-08-16 ENCOUNTER — Ambulatory Visit: Payer: Medicare HMO | Admitting: Urology

## 2023-08-16 VITALS — BP 156/71 | HR 71 | Ht 60.0 in | Wt 146.0 lb

## 2023-08-16 DIAGNOSIS — N3941 Urge incontinence: Secondary | ICD-10-CM

## 2023-08-16 DIAGNOSIS — R3129 Other microscopic hematuria: Secondary | ICD-10-CM | POA: Diagnosis not present

## 2023-08-16 DIAGNOSIS — R3915 Urgency of urination: Secondary | ICD-10-CM

## 2023-08-16 DIAGNOSIS — R350 Frequency of micturition: Secondary | ICD-10-CM

## 2023-08-16 LAB — CBC
Hematocrit: 37 % (ref 34.0–46.6)
Hemoglobin: 11.9 g/dL (ref 11.1–15.9)
MCH: 29.4 pg (ref 26.6–33.0)
MCHC: 32.2 g/dL (ref 31.5–35.7)
MCV: 91 fL (ref 79–97)
Platelets: 188 10*3/uL (ref 150–450)
RBC: 4.05 x10E6/uL (ref 3.77–5.28)
RDW: 12.5 % (ref 11.7–15.4)
WBC: 6.1 10*3/uL (ref 3.4–10.8)

## 2023-08-16 LAB — VITAMIN D 25 HYDROXY (VIT D DEFICIENCY, FRACTURES): Vit D, 25-Hydroxy: 40.6 ng/mL (ref 30.0–100.0)

## 2023-08-16 LAB — CMP14+EGFR
ALT: 17 [IU]/L (ref 0–32)
AST: 25 [IU]/L (ref 0–40)
Albumin: 4.1 g/dL (ref 3.8–4.8)
Alkaline Phosphatase: 53 [IU]/L (ref 44–121)
BUN/Creatinine Ratio: 19 (ref 12–28)
BUN: 14 mg/dL (ref 8–27)
Bilirubin Total: 0.3 mg/dL (ref 0.0–1.2)
CO2: 25 mmol/L (ref 20–29)
Calcium: 9.2 mg/dL (ref 8.7–10.3)
Chloride: 105 mmol/L (ref 96–106)
Creatinine, Ser: 0.75 mg/dL (ref 0.57–1.00)
Globulin, Total: 1.9 g/dL (ref 1.5–4.5)
Glucose: 93 mg/dL (ref 70–99)
Potassium: 4.1 mmol/L (ref 3.5–5.2)
Sodium: 141 mmol/L (ref 134–144)
Total Protein: 6 g/dL (ref 6.0–8.5)
eGFR: 81 mL/min/{1.73_m2} (ref >59)

## 2023-08-16 LAB — MICROSCOPIC EXAMINATION: Bacteria, UA: NONE SEEN

## 2023-08-16 LAB — URINALYSIS, ROUTINE W REFLEX MICROSCOPIC
Bilirubin, UA: NEGATIVE
Glucose, UA: NEGATIVE
Ketones, UA: NEGATIVE
Leukocytes,UA: NEGATIVE
Nitrite, UA: NEGATIVE
Protein,UA: NEGATIVE
Specific Gravity, UA: <1.005 — ABNORMAL LOW (ref 1.005–1.030)
Urobilinogen, Ur: 0.2 mg/dL (ref 0.2–1.0)
pH, UA: 6 (ref 5.0–7.5)

## 2023-08-16 MED ORDER — CIPROFLOXACIN HCL 500 MG PO TABS
500.0000 mg | ORAL_TABLET | Freq: Once | ORAL | Status: AC
Start: 2023-08-16 — End: 2023-08-16
  Administered 2023-08-16: 500 mg via ORAL

## 2023-10-18 ENCOUNTER — Telehealth: Payer: Self-pay | Admitting: Family Medicine

## 2023-10-18 NOTE — Telephone Encounter (Signed)
Pt scheduled for tomorrow  Copied from CRM [id number]. Topic: Appointments - Appointment Scheduling >> Oct 18, 2023  8:53 AM Desma Mcgregor wrote: Patient/patient representative is calling to schedule an appointment, but no appointments are open for today with Dr Nadine Counts or any doctor. Patient has a dry cough and wheezing. She is wanting to come in later this afternoon. CB# [phone] or [phone]

## 2023-10-19 ENCOUNTER — Encounter: Payer: Self-pay | Admitting: Nurse Practitioner

## 2023-10-19 ENCOUNTER — Ambulatory Visit: Payer: Medicare HMO | Admitting: Nurse Practitioner

## 2023-10-19 VITALS — BP 139/60 | HR 75 | Temp 97.1°F | Resp 20 | Ht 60.0 in | Wt 145.0 lb

## 2023-10-19 DIAGNOSIS — J069 Acute upper respiratory infection, unspecified: Secondary | ICD-10-CM | POA: Diagnosis not present

## 2023-10-19 MED ORDER — BENZONATATE 100 MG PO CAPS: 100.0000 mg | ORAL_CAPSULE | Freq: Three times a day (TID) | ORAL | 0 refills | Status: DC | PRN

## 2023-10-19 MED ORDER — AZITHROMYCIN 250 MG PO TABS: ORAL_TABLET | ORAL | 0 refills | Status: DC

## 2023-10-19 NOTE — Patient Instructions (Signed)

## 2023-10-19 NOTE — Progress Notes (Signed)
Subjective:    Patient ID: Ashley [name], female    DOB: [birth year], 77 y.o.   MRN: [id number]   Chief Complaint: dry cough   Cough This is a new problem. The current episode started in the past 7 days. The problem has been gradually improving. The problem occurs every few minutes. The cough is Non-productive. Associated symptoms include headaches and rhinorrhea. Pertinent negatives include no ear pain, fever, sore throat or shortness of breath. Nothing aggravates the symptoms. She has tried OTC cough suppressant for the symptoms. The treatment provided mild relief.    Patient Active Problem List   Diagnosis Date Noted   Osteopenia with high risk of fracture 08/15/2023   Urge incontinence 07/06/2023   Urinary urgency 07/06/2023   Urinary frequency 07/06/2023   Dyslipidemia 11/20/2022   Essential hypertension 12/11/2018   Grade I internal hemorrhoids 11/10/2016   FH: colon cancer 09/01/2016   Rectal bleeding 09/01/2016   History of colonic polyps 09/01/2016   Symptomatic menopausal or female climacteric states 07/23/2013   Postmenopausal atrophic vaginitis 07/23/2013   Bowel habit changes 07/05/2011   Fecal incontinence 07/05/2011   Herpes zoster    Hypercholesterolemia    Coronary artery disease 01/08/2010       Review of Systems  Constitutional:  Negative for fever.  HENT:  Positive for rhinorrhea. Negative for ear pain and sore throat.   Respiratory:  Positive for cough. Negative for shortness of breath.   Neurological:  Positive for headaches.       Objective:   Physical Exam Constitutional:      Appearance: Normal appearance.  HENT:     Right Ear: Tympanic membrane normal.     Left Ear: Tympanic membrane normal.     Nose: Congestion and rhinorrhea present.     Mouth/Throat:     Pharynx: No oropharyngeal exudate or posterior oropharyngeal erythema.  Cardiovascular:     Rate and Rhythm: Normal rate and regular rhythm.     Heart sounds: Normal heart  sounds.  Pulmonary:     Breath sounds: Normal breath sounds. No wheezing or rhonchi.  Musculoskeletal:     Cervical back: Normal range of motion and neck supple.  Skin:    General: Skin is warm.  Neurological:     General: No focal deficit present.     Mental Status: She is alert and oriented to person, place, and time.  Psychiatric:        Mood and Affect: Mood normal.        Behavior: Behavior normal.    BP 139/60   Pulse 75   Temp (!) 97.1 F (36.2 C) (Temporal)   Resp 20   Ht 5' (1.524 m)   Wt 145 lb (65.8 kg)   SpO2 97%   BMI 28.32 kg/m         Assessment & Plan:   Ashley [name] in today with chief complaint of dry cough   1. URI with cough and congestion 1. Take meds as prescribed 2. Use a cool mist humidifier especially during the winter months and when heat has been humid. 3. Use saline nose sprays frequently 4. Saline irrigations of the nose can be very helpful if done frequently.  * 4X daily for 1 week*  * Use of a nettie pot can be helpful with this. Follow directions with this* 5. Drink plenty of fluids 6. Keep thermostat turn down low 7.For any cough or congestion- tessalon perles 8. For fever or aces or  pains- take tylenol or ibuprofen appropriate for age and weight.  * for fevers greater than 101 orally you may alternate ibuprofen and tylenol every  3 hours.       The above assessment and management plan was discussed with the patient. The patient verbalized understanding of and has agreed to the management plan. Patient is aware to call the clinic if symptoms persist or worsen. Patient is aware when to return to the clinic for a follow-up visit. Patient educated on when it is appropriate to go to the emergency department.   Mary-Margaret Daphine Deutscher, FNP

## 2023-11-24 ENCOUNTER — Other Ambulatory Visit: Payer: Self-pay | Admitting: Cardiology

## 2024-01-02 ENCOUNTER — Other Ambulatory Visit (HOSPITAL_COMMUNITY): Payer: Medicare HMO

## 2024-01-03 ENCOUNTER — Ambulatory Visit (HOSPITAL_COMMUNITY): Payer: Medicare HMO

## 2024-01-31 ENCOUNTER — Telehealth: Payer: Self-pay | Admitting: Cardiology

## 2024-01-31 NOTE — Telephone Encounter (Signed)
 Pt is requesting a callback regarding her wanting to know if she's to have labs done before coming to her upcoming appt or will it be done in the office the day of. If no answer she asked to leave a voicemail. Please advise

## 2024-02-01 NOTE — Telephone Encounter (Signed)
 Attempted to call patient, no answer left detailed message informing patient no labs needed prior to 3/26 OV.

## 2024-02-02 NOTE — Telephone Encounter (Signed)
 2nd attempt to call patient, no answer left message requesting a call back.

## 2024-02-05 NOTE — Telephone Encounter (Signed)
 3rd attempt to call patient, no answer, left message requesting a call back Nursing will await for patient to return call

## 2024-02-05 NOTE — Progress Notes (Unsigned)
  Cardiology Office Note:   Date:  02/08/2024  ID:  Ashley [name], DOB Oct 23, 1946, MRN [id number] PCP: Ashley Ip, DO  [facility] Providers Cardiologist:  None {  History of Present Illness:   Ashley [name] is a 78 y.o. female who presents for follow up of CAD.    She is status post stenting of the proximal LAD and first obtuse marginal vessels in February 2011 with bare-metal stents. She had a normal stress Echo in 3/15.  She was seeing Dr. [place] but lives in the [place] area.    She did have some shortness of breath at the last visit and I sent her for a POET (Plain Old Exercise Treadmill).  This was negative for any evidence of ischemia although she did not have a tremendous exercise tolerance.    Since I last saw her she has done well.  She is practicing for the senior games.  She plays pickle ball and bowls. The patient denies any new symptoms [name] as chest discomfort, neck or arm discomfort. There has been no new shortness of breath, PND or orthopnea. There have been no reported palpitations, presyncope or syncope.   ROS: As stated in the HPI and negative for all other systems.  Studies Reviewed:    EKG:   EKG Interpretation Date/Time:  Wednesday February 07 2024 16:04:38 EDT Ventricular Rate:  73 PR Interval:  138 QRS Duration:  74 QT Interval:  408 QTC Calculation: 449 R Axis:   50  Text Interpretation: Sinus rhythm with Premature atrial complexes When compared with ECG of 09-Jan-2010 05:52, Premature atrial complexes are now Present Confirmed by Rollene Rotunda ([id number]) on 02/07/2024 4:30:28 PM     Risk Assessment/Calculations:       Physical Exam:   VS:  BP (!) 160/80   Pulse 73   Ht 5' (1.524 m)   Wt 147 lb (66.7 kg)   BMI 28.71 kg/m    Wt Readings from Last 3 Encounters:  02/07/24 147 lb (66.7 kg)  10/19/23 145 lb (65.8 kg)  08/16/23 146 lb (66.2 kg)     GEN: Well nourished, well developed in no acute distress NECK: No JVD; Right carotid  bruits CARDIAC: RRR, no murmurs, rubs, gallops RESPIRATORY:  Clear to auscultation without rales, wheezing or rhonchi  ABDOMEN: Soft, non-tender, non-distended EXTREMITIES:  No edema; No deformity     ASSESSMENT AND PLAN:  Leg swelling: This has been mild.  She manages this with compression stockings.  No change in therapy.    CAD: She has had no change in symptoms since her last stress test 2 years ago.  No further testing.   Dyslipidemia: Her LDL was 70 and I will repeat this fasting.    Carotid: She had some nonobstructive plaque 2 years ago and has a bruit.  I will repeat a carotid Doppler.      Follow up with me in one year.   Signed, Rollene Rotunda, MD

## 2024-02-07 ENCOUNTER — Other Ambulatory Visit: Payer: Self-pay | Admitting: *Deleted

## 2024-02-07 ENCOUNTER — Encounter: Payer: Self-pay | Admitting: Cardiology

## 2024-02-07 ENCOUNTER — Ambulatory Visit: Payer: Medicare HMO | Admitting: Cardiology

## 2024-02-07 VITALS — BP 160/80 | HR 73 | Ht 60.0 in | Wt 147.0 lb

## 2024-02-07 DIAGNOSIS — M7989 Other specified soft tissue disorders: Secondary | ICD-10-CM

## 2024-02-07 DIAGNOSIS — E785 Hyperlipidemia, unspecified: Secondary | ICD-10-CM

## 2024-02-07 DIAGNOSIS — Z79899 Other long term (current) drug therapy: Secondary | ICD-10-CM

## 2024-02-07 DIAGNOSIS — R0989 Other specified symptoms and signs involving the circulatory and respiratory systems: Secondary | ICD-10-CM

## 2024-02-07 DIAGNOSIS — I2510 Atherosclerotic heart disease of native coronary artery without angina pectoris: Secondary | ICD-10-CM

## 2024-02-07 DIAGNOSIS — R0602 Shortness of breath: Secondary | ICD-10-CM | POA: Diagnosis not present

## 2024-02-07 DIAGNOSIS — I10 Essential (primary) hypertension: Secondary | ICD-10-CM

## 2024-02-07 NOTE — Patient Instructions (Signed)
 Medication Instructions:  The current medical regimen is effective;  continue present plan and medications.  *If you need a refill on your cardiac medications before your next appointment, please call your pharmacy*   Lab Work: Please have fasting blood work at your closest [facility]. (Lipid)  If you have labs (blood work) drawn today and your tests are completely normal, you will receive your results only by: MyChart Message (if you have MyChart) OR A paper copy in the mail If you have any lab test that is abnormal or we need to change your treatment, we will call you to review the results.   Testing/Procedures: Your physician has requested that you have a carotid duplex. This test is an ultrasound of the carotid arteries in your neck. It looks at blood flow through these arteries that supply the brain with blood. Allow one hour for this exam. There are no restrictions or special instructions. This will be completed at [facility] and you will be contacted to be scheduled.   Follow-Up: At [facility], you and your health needs are our priority.  As part of our continuing mission to provide you with exceptional heart care, we have created designated Provider Care Teams.  These Care Teams include your primary Cardiologist (physician) and Advanced Practice Providers (APPs -  Physician Assistants and Nurse Practitioners) who all work together to provide you with the care you need, when you need it.  We recommend signing up for the patient portal called "MyChart".  Sign up information is provided on this After Visit Summary.  MyChart is used to connect with patients for Virtual Visits (Telemedicine).  Patients are able to view lab/test results, encounter notes, upcoming appointments, etc.  Non-urgent messages can be sent to your provider as well.   To learn more about what you can do with MyChart, go to [email or web address].    Your next appointment:   1 year(s)  Provider:   Rollene Rotunda, MD

## 2024-02-08 ENCOUNTER — Encounter: Payer: Self-pay | Admitting: Cardiology

## 2024-02-19 ENCOUNTER — Ambulatory Visit: Payer: Medicare HMO | Admitting: Family Medicine

## 2024-02-19 ENCOUNTER — Ambulatory Visit (INDEPENDENT_AMBULATORY_CARE_PROVIDER_SITE_OTHER)

## 2024-02-19 ENCOUNTER — Encounter: Payer: Self-pay | Admitting: Family Medicine

## 2024-02-19 ENCOUNTER — Other Ambulatory Visit: Payer: Self-pay | Admitting: Family Medicine

## 2024-02-19 VITALS — BP 142/76 | HR 70 | Temp 98.7°F | Ht 60.0 in | Wt 146.0 lb

## 2024-02-19 DIAGNOSIS — I10 Essential (primary) hypertension: Secondary | ICD-10-CM

## 2024-02-19 DIAGNOSIS — M8580 Other specified disorders of bone density and structure, unspecified site: Secondary | ICD-10-CM | POA: Diagnosis not present

## 2024-02-19 DIAGNOSIS — I2510 Atherosclerotic heart disease of native coronary artery without angina pectoris: Secondary | ICD-10-CM | POA: Diagnosis not present

## 2024-02-19 DIAGNOSIS — Z780 Asymptomatic menopausal state: Secondary | ICD-10-CM

## 2024-02-19 DIAGNOSIS — I2583 Coronary atherosclerosis due to lipid rich plaque: Secondary | ICD-10-CM

## 2024-02-19 DIAGNOSIS — N764 Abscess of vulva: Secondary | ICD-10-CM

## 2024-02-19 DIAGNOSIS — E7800 Pure hypercholesterolemia, unspecified: Secondary | ICD-10-CM

## 2024-02-19 DIAGNOSIS — R413 Other amnesia: Secondary | ICD-10-CM

## 2024-02-19 MED ORDER — CEPHALEXIN 500 MG PO CAPS
500.0000 mg | ORAL_CAPSULE | Freq: Four times a day (QID) | ORAL | 0 refills | Status: AC
Start: 2024-02-19 — End: 2024-02-26

## 2024-02-19 MED ORDER — FLUCONAZOLE 150 MG PO TABS
150.0000 mg | ORAL_TABLET | Freq: Once | ORAL | 0 refills | Status: AC
Start: 2024-02-19 — End: 2024-02-19

## 2024-02-19 MED ORDER — AMLODIPINE BESYLATE 2.5 MG PO TABS
2.5000 mg | ORAL_TABLET | Freq: Every day | ORAL | 3 refills | Status: DC
Start: 2024-02-19 — End: 2024-09-04

## 2024-02-19 NOTE — Patient Instructions (Signed)
Skin Abscess  A skin abscess is an infected spot of skin. It can have pus in it. An abscess can happen in any part of your body. Some abscesses break open (rupture) on their own. Most keep getting worse unless they are treated. If your abscess is not treated, the infection can spread deeper into your body and blood. This can make you feel sick. What are the causes? Germs that enter your skin. This may happen if you have: A cut or scrape. A wound from a needle or an insect bite. Blocked oil or sweat glands. A problem with the spot where your hair goes into your skin. A fluid-filled sac called a cyst under your skin. What increases the risk? Having problems with how your blood moves through your body. Having a weak body defense system (immune system). Having diabetes. Having dry and irritated skin. Needing to get shots often. Putting drugs into your body with a needle. Having a splinter or something else in your skin. Smoking. What are the signs or symptoms? A firm bump under your skin that hurts. A bump with pus at the top. Redness and swelling. Warm or tender spots. A sore on the skin. How is this treated? You may need to: Put a heat pack or a warm, wet washcloth on the spot. Have the pus drained. Take antibiotics. Follow these instructions at home: Medicines Take over-the-counter and prescription medicines only as told by your doctor. If you were prescribed antibiotics, take them as told by your doctor. Do not stop taking them even if you start to feel better. Abscess care  If you have an abscess that has not drained, put heat on it. Use the heat source that your doctor recommends, such as a moist heat pack or a heating pad. Place a towel between your skin and the heat source. Leave the heat on for 20-30 minutes. If your skin turns bright red, take off the heat right away to prevent burns. The risk of burns is higher if you cannot feel pain, heat, or cold. Follow  instructions from your doctor about how to take care of your abscess. Make sure you: Cover the abscess with a bandage. Wash your hands with soap and water for at least 20 seconds before and after you change your bandage. If you cannot use soap and water, use hand sanitizer. Change your bandage as told by your doctor. Check your abscess every day for signs that the infection is getting worse. Check for: More redness, swelling, or pain. More fluid or blood. Warmth. More pus or a worse smell. General instructions To keep the infection from spreading: Do not share personal items or towels. Do not go in a hot tub with others. Avoid making skin contact with others. Be careful when you get rid of used bandages or any pus from the abscess. Do not smoke or use any products that contain nicotine or tobacco. If you need help quitting, ask your doctor. Contact a doctor if: You see red streaks on your skin near the abscess. You have any signs of worse infection. You vomit every time you eat or drink. You have a fever, chills, or muscle aches. The cyst or abscess comes back. Get help right away if: You have very bad pain. You make less pee (urine) than normal. This information is not intended to replace advice given to you by your health care provider. Make sure you discuss any questions you have with your health care provider. Document Revised: 06/15/2022  Document Reviewed: 06/15/2022 Elsevier Patient Education  2024 [employer].

## 2024-02-19 NOTE — Progress Notes (Signed)
 Subjective: CC: Chronic follow-up 6 months PCP: Raliegh Ip, DO ZOX:[id number] A Ashley [name] is a 78 y.o. female presenting to clinic today for:  1.  Hyperlipidemia associate with CAD; memory changes She is compliant with Crestor 40 mg daily.  Had her visit with Dr. Antoine Poche recently and was advised to get a fasting lipid panel.  She is here to have this done today.  She did not take any of her medicines this morning because she did not want it to affect her labs.  She reports no chest pain, shortness of breath, dizziness.  She is accompanied today by her daughter who notes that she has been seeing some progressive memory changes.  She did not apparently remember the recommendations by the urologist recently for pelvic floor exercises.  Seems to be more forgetful as of late.  2.  Labial cyst Patient reports a lesion on her vagina that has been having pus coming out of it.  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  She reports no fevers.  This has been ongoing for several days.  She wears depends despite reported improvement in bladder dysfunction with Myrbetriq.   ROS: Per HPI  Allergies  Allergen Reactions   Sulfa Antibiotics Swelling    Angoedema   Demerol Nausea And Vomiting   Lisinopril Swelling    angioedema . Uncertain if due to Sulfa or from ACE-I but ACE-I was discontinued to be safe.   Moviprep [Peg-Kcl-Nacl-Nasulf-Na Asc-C] Nausea And Vomiting   Declomycin [Demeclocycline] Rash   Past Medical History:  Diagnosis Date   Coronary artery disease 01/08/2010   STATUS POST STENTING OF THE LAD AND THE FIRST OBTUSE MARGINAL VESSEL   Diverticulitis    Headache(784.0)    Herpes zoster    Hyperlipidemia    Hypertension    Phlebitis Right   Leg   PONV (postoperative nausea and vomiting)    Stomach ulcer     Current Outpatient Medications:    amLODipine (NORVASC) 2.5 MG tablet, Take 1 tablet (2.5 mg total) by mouth daily., Disp: 90 tablet, Rfl: 3   aspirin EC  81 MG tablet, Take 81 mg by mouth at bedtime., Disp: , Rfl:    benzonatate (TESSALON PERLES) 100 MG capsule, Take 1 capsule (100 mg total) by mouth 3 (three) times daily as needed for cough., Disp: 20 capsule, Rfl: 0   Calcium Carb-Cholecalciferol (OS-CAL CALCIUM + D3 PO), Take 1 tablet by mouth daily. Calcium 1200 mg with Vitamin D, Disp: , Rfl:    cholecalciferol (VITAMIN D) 1000 units tablet, Take 1,000 Units by mouth daily., Disp: , Rfl:    mirabegron ER (MYRBETRIQ) 25 MG TB24 tablet, Take 1 tablet (25 mg total) by mouth daily., Disp: 90 tablet, Rfl: 3   Omega-3 Fatty Acids (FISH OIL) 1200 MG CAPS, Take 1 capsule by mouth every morning., Disp: , Rfl:    rosuvastatin (CRESTOR) 40 MG tablet, TAKE ONE TABLET BY MOUTH DAILY, Disp: 90 tablet, Rfl: 3   vitamin B-12 (CYANOCOBALAMIN) 1000 MCG tablet, Take 1,000 mcg by mouth daily., Disp: , Rfl:  Social History   Socioeconomic History   Marital status: Married    Spouse name: Earvin Hansen   Number of children: 5   Years of education: GED   Highest education level: GED or equivalent  Occupational History   Occupation: retired  Tobacco Use   Smoking status: Never   Smokeless tobacco: Never  Vaping Use   Vaping status: Never Used  Substance  and Sexual Activity   Alcohol use: Yes    Comment: social-wine   Drug use: No   Sexual activity: Yes    Birth control/protection: Surgical  Other Topics Concern   Not on file  Social History Narrative   One level home with her husband    Husband has dementia and she is sole caregiver   Daughter lives next door   Enjoys line-dancing 1-2x per week and playing pickle ball at [employer]   Social Drivers of Health   Financial Resource Strain: Low Risk  (02/01/2023)   Overall Financial Resource Strain (CARDIA)    Difficulty of Paying Living Expenses: Not hard at all  Food Insecurity: No Food Insecurity (02/01/2023)   Hunger Vital Sign    Worried About Running Out of Food in the Last Year: Never true    Ran  Out of Food in the Last Year: Never true  Transportation Needs: No Transportation Needs (02/01/2023)   PRAPARE - Administrator, Civil Service (Medical): No    Lack of Transportation (Non-Medical): No  Physical Activity: Insufficiently Active (02/01/2023)   Exercise Vital Sign    Days of Exercise per Week: 3 days    Minutes of Exercise per Session: 30 min  Stress: No Stress Concern Present (02/01/2023)   [employer] of Occupational Health - Occupational Stress Questionnaire    Feeling of Stress : Not at all  Social Connections: Socially Integrated (02/01/2023)   Social Connection and Isolation Panel [NHANES]    Frequency of Communication with Friends and Family: More than three times a week    Frequency of Social Gatherings with Friends and Family: More than three times a week    Attends Religious Services: More than 4 times per year    Active Member of [employer] or Organizations: Yes    Attends Engineer, structural: More than 4 times per year    Marital Status: Married  Catering manager Violence: Not At Risk (02/01/2023)   Humiliation, Afraid, Rape, and Kick questionnaire    Fear of Current or Ex-Partner: No    Emotionally Abused: No    Physically Abused: No    Sexually Abused: No   Family History  Problem Relation Age of Onset   Emphysema Father    Colon cancer Father        12s   Heart disease Sister    Heart disease Maternal Grandmother    Hypertension Maternal Grandmother    Heart disease Paternal Grandmother    Hypertension Paternal Grandmother    Lupus Child    Breast cancer Neg Hx     Objective: Office vital signs reviewed. BP (!) 142/76   Pulse 70   Temp 98.7 F (37.1 C)   Ht 5' (1.524 m)   Wt 146 lb (66.2 kg)   SpO2 97%   BMI 28.51 kg/m   Physical Examination:  General: Awake, alert, well nourished, No acute distress HEENT: sclera white, MMM Cardio: regular rate and rhythm, S1S2 heard, no murmurs appreciated Pulm: clear to  auscultation bilaterally, no wheezes, rhonchi or rales; normal work of breathing on room air GU: Very small lesion noted on the left labia majora externally consistent with infected cyst versus abscess scant drainage present     02/19/2024    9:57 AM 02/13/2023    2:12 PM  MMSE - Mini Mental State Exam  Orientation to time 5 5  Orientation to Place 5 5  Registration 3 2  Attention/ Calculation 4 5  Recall 3 2  Language- name 2 objects 2 2  Language- repeat 1 1  Language- follow 3 step command 3 3  Language- read & follow direction 1 1  Write a sentence 1 1  Copy design 1 1  Total score 29 28   Assessment/ Plan: 78 y.o. female   Essential hypertension - Plan: amLODipine (NORVASC) 2.5 MG tablet  Hypercholesterolemia - Plan: Lipid Panel  Coronary artery disease due to lipid rich plaque - Plan: Lipid Panel  Osteopenia with high risk of fracture  Memory change - Plan: Ambulatory referral to Neurology  Left genital labial abscess - Plan: fluconazole (DIFLUCAN) 150 MG tablet, cephALEXin (KEFLEX) 500 MG capsule  Blood pressure not at goal.  She has not taken her blood pressure medicines yet.  She is to return in 2 weeks for blood pressure recheck with nurse after she is taken her antihypertensive  Fasting lipid collected.  Will CC to her cardiologist as Lorain Childes  She will have DEXA scan performed today.  Has known osteopenia with high risk of fracture and reports compliance with vitamin D today  MMSE collected today and demonstrated no evidence of dementia on screening but I will place referral to neurology per the daughter's request to further evaluate  For the lesion on the labia majora, this appears to be an infected cyst versus abscess.  It is draining so no I&D performed.  I placed her on Keflex 4 times daily and Diflucan as she has allergies to sulfa and tetracyclines.   Raliegh Ip, DO [facility] [phone]

## 2024-02-20 ENCOUNTER — Encounter: Payer: Self-pay | Admitting: Family Medicine

## 2024-02-20 LAB — LIPID PANEL
Chol/HDL Ratio: 2.2 ratio (ref 0.0–4.4)
Cholesterol, Total: 153 mg/dL (ref 100–199)
HDL: 69 mg/dL (ref >39)
LDL Chol Calc (NIH): 73 mg/dL (ref 0–99)
Triglycerides: 53 mg/dL (ref 0–149)
VLDL Cholesterol Cal: 11 mg/dL (ref 5–40)

## 2024-02-26 ENCOUNTER — Other Ambulatory Visit: Payer: Self-pay | Admitting: Family Medicine

## 2024-02-26 ENCOUNTER — Encounter: Payer: Self-pay | Admitting: Family Medicine

## 2024-02-26 DIAGNOSIS — M8580 Other specified disorders of bone density and structure, unspecified site: Secondary | ICD-10-CM

## 2024-02-26 MED ORDER — ALENDRONATE SODIUM 70 MG PO TABS
70.0000 mg | ORAL_TABLET | ORAL | 4 refills | Status: DC
Start: 2024-02-26 — End: 2024-09-04

## 2024-03-01 ENCOUNTER — Ambulatory Visit (HOSPITAL_COMMUNITY)
Admission: RE | Admit: 2024-03-01 | Discharge: 2024-03-01 | Disposition: A | Discharge status: Home / Self Care | Source: Ambulatory Visit | Attending: Cardiology | Admitting: Cardiology

## 2024-03-01 ENCOUNTER — Ambulatory Visit (HOSPITAL_COMMUNITY)
Admission: RE | Admit: 2024-03-01 | Discharge: 2024-03-01 | Disposition: A | Discharge status: Home / Self Care | Source: Ambulatory Visit | Attending: Urology | Admitting: Urology

## 2024-03-01 DIAGNOSIS — R0989 Other specified symptoms and signs involving the circulatory and respiratory systems: Secondary | ICD-10-CM

## 2024-03-01 DIAGNOSIS — R3129 Other microscopic hematuria: Secondary | ICD-10-CM | POA: Diagnosis present

## 2024-03-01 LAB — POCT I-STAT CREATININE: Creatinine, Ser: 0.8 mg/dL (ref 0.44–1.00)

## 2024-03-01 MED ORDER — IOHEXOL 300 MG/ML  SOLN
100.0000 mL | Freq: Once | INTRAMUSCULAR | Status: AC | PRN
  Administered 2024-03-01: 100 mL via INTRAVENOUS

## 2024-03-04 ENCOUNTER — Encounter: Payer: Self-pay | Admitting: *Deleted

## 2024-03-05 ENCOUNTER — Other Ambulatory Visit: Payer: Self-pay | Admitting: Urology

## 2024-03-05 DIAGNOSIS — N1330 Unspecified hydronephrosis: Secondary | ICD-10-CM

## 2024-04-10 ENCOUNTER — Ambulatory Visit: Payer: Self-pay

## 2024-04-10 ENCOUNTER — Encounter: Payer: Self-pay | Admitting: Physician Assistant

## 2024-04-11 ENCOUNTER — Ambulatory Visit

## 2024-04-11 ENCOUNTER — Ambulatory Visit: Admitting: Physician Assistant

## 2024-04-12 NOTE — Telephone Encounter (Signed)
 FYI and advise

## 2024-04-17 ENCOUNTER — Ambulatory Visit

## 2024-04-17 VITALS — BP 142/76 | HR 70 | Ht 61.0 in | Wt 146.0 lb

## 2024-04-17 DIAGNOSIS — Z0000 Encounter for general adult medical examination without abnormal findings: Secondary | ICD-10-CM | POA: Diagnosis not present

## 2024-04-17 NOTE — Patient Instructions (Signed)
 Ashley [name] , Thank you for taking time out of your busy schedule to complete your Annual Wellness Visit with me. I enjoyed our conversation and look forward to speaking with you again next year. I, as well as your care team,  appreciate your ongoing commitment to your health goals. Please review the following plan we discussed and let me know if I can assist you in the future. Your Game plan/ To Do List    Follow up Visits: Next Medicare AWV with our clinical staff: 04/21/25 at 3:10a.m.    Next Office Visit with your provider: 09/04/24 at 9:50a.m.  Clinician Recommendations:  Aim for 30 minutes of exercise or brisk walking, 6-8 glasses of water , and 5 servings of fruits and vegetables each day. N/a      This is a list of the screening recommended for you and due dates:  Health Maintenance  Topic Date Due   COVID-19 Vaccine (1) 05/03/2025*   Flu Shot  06/14/2024   Medicare Annual Wellness Visit  04/17/2025   Mammogram  07/11/2025   DEXA scan (bone density measurement)  02/19/2026   DTaP/Tdap/Td vaccine (2 - Td or Tdap) 02/12/2033   Colon Cancer Screening  06/27/2033   Pneumonia Vaccine  Completed   Hepatitis C Screening  Completed   Zoster (Shingles) Vaccine  Completed   HPV Vaccine  Aged Out   Meningitis B Vaccine  Aged Out  *Topic was postponed. The date shown is not the original due date.    Advanced directives: (Declined) Advance directive discussed with you today. Even though you declined this today, please call our office should you change your mind, and we can give you the proper paperwork for you to fill out. Advance Care Planning is important because it:  [x]  Makes sure you receive the medical care that is consistent with your values, goals, and preferences  [x]  It provides guidance to your family and loved ones and reduces their decisional burden about whether or not they are making the right decisions based on your wishes.  Follow the link provided in your after visit  summary or read over the paperwork we have mailed to you to help you started getting your Advance Directives in place. If you need assistance in completing these, please reach out to us  so that we can help you!  See attachments for Preventive Care and Fall Prevention Tips.

## 2024-04-17 NOTE — Progress Notes (Signed)
 Subjective:   Ashley [name] is a 78 y.o. who presents for a Medicare Wellness preventive visit.  As a reminder, Annual Wellness Visits don't include a physical exam, and some assessments may be limited, especially if this visit is performed virtually. We may recommend an in-person follow-up visit with your provider if needed.  Visit Complete: Virtual I connected with  Zeke Hick on 04/17/24 by a audio enabled telemedicine application and verified that I am speaking with the correct person using two identifiers.  Patient Location: Home  Provider Location: Home Office  I discussed the limitations of evaluation and management by telemedicine. The patient expressed understanding and agreed to proceed.  Vital Signs: Because this visit was a virtual/telehealth visit, some criteria may be missing or patient reported. Any vitals not documented were not able to be obtained and vitals that have been documented are patient reported.  VideoDeclined- This patient declined Librarian, academic. Therefore the visit was completed with audio only.  Persons Participating in Visit: Patient.  AWV Questionnaire: No: Patient Medicare AWV questionnaire was not completed prior to this visit.  Cardiac Risk Factors include: advanced age (>16men, >24 women);dyslipidemia     Objective:     Today's Vitals   04/17/24 1609  BP: (!) 142/76  Pulse: 70  Weight: 146 lb (66.2 kg)  Height: 5\' 1"  (1.549 m)   Body mass index is 27.59 kg/m.     04/17/2024    2:48 PM 06/28/2023   11:59 AM 06/26/2023    8:30 AM 02/01/2023    8:25 AM 01/27/2023   11:35 AM 01/20/2023    2:15 PM 11/09/2021    2:21 PM  Advanced Directives  Does Patient Have a Medical Advance Directive? No No No No No No Yes  Type of Tax inspector;Living will  Copy of Healthcare Power of Attorney in Chart?       No - copy requested  Would patient like information on creating a  medical advance directive?  No - Guardian declined No - Patient declined No - Patient declined No - Patient declined No - Patient declined     Current Medications (verified) Outpatient Encounter Medications as of 04/17/2024  Medication Sig   alendronate  (FOSAMAX ) 70 MG tablet Take 1 tablet (70 mg total) by mouth every 7 (seven) days. Take with a full glass of water  on an empty stomach.   amLODipine  (NORVASC ) 2.5 MG tablet Take 1 tablet (2.5 mg total) by mouth daily.   aspirin EC 81 MG tablet Take 81 mg by mouth at bedtime.   Calcium  Carb-Cholecalciferol (OS-CAL CALCIUM  + D3 PO) Take 1 tablet by mouth daily. Calcium  1200 mg with Vitamin D    cholecalciferol (VITAMIN D ) 1000 units tablet Take 1,000 Units by mouth daily.   mirabegron  ER (MYRBETRIQ ) 25 MG TB24 tablet Take 1 tablet (25 mg total) by mouth daily.   Omega-3 Fatty Acids (FISH OIL) 1200 MG CAPS Take 1 capsule by mouth every morning.   rosuvastatin  (CRESTOR ) 40 MG tablet TAKE ONE TABLET BY MOUTH DAILY   vitamin B-12 (CYANOCOBALAMIN) 1000 MCG tablet Take 1,000 mcg by mouth daily.   No facility-administered encounter medications on file as of 04/17/2024.    Allergies (verified) Sulfa  antibiotics, Demerol, Lisinopril , Moviprep [peg-kcl-nacl-nasulf-na asc-c], and Declomycin [demeclocycline]   History: Past Medical History:  Diagnosis Date   Coronary artery disease 01/08/2010   STATUS POST STENTING OF THE LAD AND THE FIRST  OBTUSE MARGINAL VESSEL   Diverticulitis    Headache(784.0)    Herpes zoster    Hyperlipidemia    Hypertension    Phlebitis Right   Leg   PONV (postoperative nausea and vomiting)    Stomach ulcer    Past Surgical History:  Procedure Laterality Date   ABDOMINAL HYSTERECTOMY     with bladder tack   BREAST BIOPSY Left    Normal   Bunionectomy Right    CARDIAC CATHETERIZATION  01/08/2010   CARDIAC CATHETERIZATION  01/05/2010   NORMAL LEFT VENTRICULAR SIZE AND NORMAL SYSTOLIC FUNCTION. EF IS 60%.    CATARACT  EXTRACTION     CATARACT EXTRACTION Bilateral    COLONOSCOPY  07/13/2011   RMR:Lax anal sphincter tone otherwise normal/left sided diverticula, polyp removed but not analyzed   COLONOSCOPY N/A 09/21/2016   Procedure: COLONOSCOPY;  Surgeon: Suzette Espy, MD;  Location: [facility];  Service: Endoscopy;  Laterality: N/A;  1:00 pm   COLONOSCOPY WITH PROPOFOL  N/A 06/28/2023   Procedure: COLONOSCOPY WITH PROPOFOL ;  Surgeon: Suzette Espy, MD;  Location: [facility];  Service: Endoscopy;  Laterality: N/A;  215pm, asa 3, pt knows to arrive at 11:05   CORONARY STENT PLACEMENT     CYST EXCISION Left 01/27/2023   Procedure: LEFT THUMB MUCOID CYST EXCISION AND DEBRIDEMENT INTERPHALANGEAL JOINT;  Surgeon: Brunilda Capra, MD;  Location: [facility];  Service: Orthopedics;  Laterality: Left;  30 MIN   POLYPECTOMY  06/28/2023   Procedure: POLYPECTOMY INTESTINAL;  Surgeon: Suzette Espy, MD;  Location: [facility];  Service: Endoscopy;;   VAGINAL HYSTERECTOMY     VEIN LIGATION Bilateral    Family History  Problem Relation Age of Onset   Emphysema Father    Colon cancer Father        8s   Heart disease Sister    Heart disease Maternal Grandmother    Hypertension Maternal Grandmother    Heart disease Paternal Grandmother    Hypertension Paternal Grandmother    Lupus Child    Breast cancer Neg Hx    Social History   Socioeconomic History   Marital status: Married    Spouse name: Doroteo Gasmen   Number of children: 5   Years of education: GED   Highest education level: GED or equivalent  Occupational History   Occupation: retired  Tobacco Use   Smoking status: Never   Smokeless tobacco: Never  Vaping Use   Vaping status: Never Used  Substance and Sexual Activity   Alcohol use: Yes    Comment: social-wine   Drug use: No   Sexual activity: Yes    Birth control/protection: Surgical  Other Topics Concern   Not on file  Social History Narrative   One level home with her  husband    Husband has dementia and she is sole caregiver   Daughter lives next door   Enjoys line-dancing 1-2x per week and playing pickle ball at [employer] center   Social Drivers of Health   Financial Resource Strain: Low Risk  (04/17/2024)   Overall Financial Resource Strain (CARDIA)    Difficulty of Paying Living Expenses: Not hard at all  Food Insecurity: No Food Insecurity (04/17/2024)   Hunger Vital Sign    Worried About Running Out of Food in the Last Year: Never true    Ran Out of Food in the Last Year: Never true  Transportation Needs: No Transportation Needs (04/17/2024)   PRAPARE - Transportation  Lack of Transportation (Medical): No    Lack of Transportation (Non-Medical): No  Physical Activity: Sufficiently Active (04/17/2024)   Exercise Vital Sign    Days of Exercise per Week: 7 days    Minutes of Exercise per Session: 30 min  Stress: No Stress Concern Present (04/17/2024)   [employer] of Occupational Health - Occupational Stress Questionnaire    Feeling of Stress : Not at all  Social Connections: Moderately Integrated (04/17/2024)   Social Connection and Isolation Panel [NHANES]    Frequency of Communication with Friends and Family: Once a week    Frequency of Social Gatherings with Friends and Family: Once a week    Attends Religious Services: More than 4 times per year    Active Member of [employer] or Organizations: Yes    Attends Banker Meetings: Never    Marital Status: Married    Tobacco Counseling Counseling given: Yes    Clinical Intake:  Pre-visit preparation completed: Yes  Pain : No/denies pain     BMI - recorded: 27.59 Nutritional Status: BMI 25 -29 Overweight Nutritional Risks: None Diabetes: No  Lab Results  Component Value Date   HGBA1C 5.7 06/22/2021   HGBA1C 6.0 05/04/2020     How often do you need to have someone help you when you read instructions, pamphlets, or other written materials from your doctor or pharmacy?: 1 -  Never  Interpreter Needed?: No  Comments: awv w/some assistance from pt's daughter-Dorothy Information entered by :: Alia T/cma   Activities of Daily Living     04/17/2024    2:45 PM 06/26/2023    8:38 AM  In your present state of health, do you have any difficulty performing the following activities:  Hearing? 0   Vision? 0   Difficulty concentrating or making decisions? 1   Comment pt has an appt to get memory test done   Walking or climbing stairs? 0   Dressing or bathing? 0   Doing errands, shopping? 1 0  Comment pt's daughter   Quarry manager and eating ? N   Using the Toilet? N   In the past six months, have you accidently leaked urine? Y   Do you have problems with loss of bowel control? Y   Managing your Medications? Y   Comment pt's daughter   Managing your Finances? Y   Comment pt's daughter   Housekeeping or managing your Housekeeping? N     Patient Care Team: Eliodoro Guerin, DO as PCP - General (Family Medicine) Riley [facility]le [facility], MD (Gastroenterology) Pcp, No  I have updated your Care Teams any recent Medical Services you may have received from other providers in the past year.     Assessment:    This is a routine wellness examination for Shanedra.  Hearing/Vision screen Hearing Screening - Comments:: Pt denies hearing dif Vision Screening - Comments:: Pt denies vision dif/pt goes to Dr. Candi Chafe in [place], [place] last eye appt 3/25   Goals Addressed             This Visit's Progress    Patient Stated       Continue keep active/healthy       Depression Screen     04/17/2024    2:51 PM 02/19/2024    9:08 AM 10/19/2023   10:10 AM 08/15/2023    1:25 PM 02/01/2023    8:24 AM 06/14/2022    2:15 PM 05/23/2022    1:37 PM  PHQ 2/9 Scores  PHQ - 2 Score 0 0 0 0 0 0 0  PHQ- 9 Score 0 0 0 0       Fall Risk     04/17/2024    2:43 PM 02/19/2024    9:08 AM 10/19/2023   10:10 AM 08/15/2023    1:25 PM 02/01/2023    8:22 AM  Fall Risk   Falls in the past  year? 0 0 0 0 0  Number falls in past yr: 0 0  0 0  Injury with Fall? 0 0  0 0  Risk for fall due to : No Fall Risks No Fall Risks  No Fall Risks No Fall Risks  Follow up Falls evaluation completed Falls evaluation completed  Education provided Falls prevention discussed    MEDICARE RISK AT HOME:  Medicare Risk at Home Any stairs in or around the home?: Yes If so, are there any without handrails?: Yes Home free of loose throw rugs in walkways, pet beds, electrical cords, etc?: Yes Adequate lighting in your home to reduce risk of falls?: Yes Life alert?: No Use of a cane, walker or w/c?: No Grab bars in the bathroom?: Yes Shower chair or bench in shower?: No Elevated toilet seat or a handicapped toilet?: Yes  TIMED UP AND GO:  Was the test performed?  no  Cognitive Function: 6CIT completed    02/19/2024    9:57 AM 02/13/2023    2:12 PM  MMSE - Mini Mental State Exam  Orientation to time 5 5  Orientation to Place 5 5  Registration 3 2  Attention/ Calculation 4 5  Recall 3 2  Language- name 2 objects 2 2  Language- repeat 1 1  Language- follow 3 step command 3 3  Language- read & follow direction 1 1  Write a sentence 1 1  Copy design 1 1  Total score 29 28        04/17/2024    2:52 PM 02/01/2023    8:26 AM 11/04/2020    2:56 PM 06/04/2019   10:20 AM  6CIT Screen  What Year? 0 points 0 points 0 points 0 points  What month? 0 points 0 points 0 points 0 points  What time? 0 points 0 points 0 points 0 points  Count back from 20 0 points 0 points 0 points 0 points  Months in reverse 2 points 0 points 0 points 0 points  Repeat phrase 4 points 0 points 0 points 4 points  Total Score 6 points 0 points 0 points 4 points    Immunizations Immunization History  Administered Date(s) Administered   Fluad Quad(high Dose 65+) 07/24/2020, 09/14/2022   Fluzone  Influenza virus vaccine,trivalent (IIV3), split virus 08/17/2011, 08/15/2012, 08/15/2013, 08/20/2014, 08/18/2016    Influenza, High Dose Seasonal PF 09/06/2017, 08/29/2023   Influenza,inj,Quad PF,6+ Mos 08/16/2018   Influenza,inj,quad, With Preservative 08/19/2015   Influenza-Unspecified 08/16/2018, 08/08/2021, 08/15/2023   Pneumococcal Conjugate-13 05/04/2020   Pneumococcal Polysaccharide-23 08/18/2016   Tdap 02/13/2023   Zoster Recombinant(Shingrix) 11/17/2021, 03/01/2022    Screening Tests Health Maintenance  Topic Date Due   COVID-19 Vaccine (1) 05/03/2025 (Originally 10/30/1951)   INFLUENZA VACCINE  06/14/2024   Medicare Annual Wellness (AWV)  04/17/2025   MAMMOGRAM  07/11/2025   DEXA SCAN  02/19/2026   DTaP/Tdap/Td (2 - Td or Tdap) 02/12/2033   Colonoscopy  06/27/2033   Pneumonia Vaccine 82+ Years old  Completed   Hepatitis C Screening  Completed   Zoster Vaccines- Shingrix  Completed  HPV VACCINES  Aged Out   Meningococcal B Vaccine  Aged Out    Health Maintenance  There are no preventive care reminders to display for this patient.  Health Maintenance Items Addressed: See Nurse Notes at the end of this note  Additional Screening:  Vision Screening: Recommended annual ophthalmology exams for early detection of glaucoma and other disorders of the eye. Would you like a referral to an eye doctor? No    Dental Screening: Recommended annual dental exams for proper oral hygiene  Community Resource Referral / Chronic Care Management: CRR required this visit?  No   CCM required this visit?  No   Plan:    I have personally reviewed and noted the following in the patient's chart:   Medical and social history Use of alcohol, tobacco or illicit drugs  Current medications and supplements including opioid prescriptions. Patient is not currently taking opioid prescriptions. Functional ability and status Nutritional status Physical activity Advanced directives List of other physicians Hospitalizations, surgeries, and ER visits in previous 12 months Vitals Screenings to include  cognitive, depression, and falls Referrals and appointments  In addition, I have reviewed and discussed with patient certain preventive protocols, quality metrics, and best practice recommendations. A written personalized care plan for preventive services as well as general preventive health recommendations were provided to patient.   Michaelle Adolphus, CMA   04/17/2024   After Visit Summary: (MyChart) Due to this being a telephonic visit, the after visit summary with patients personalized plan was offered to patient via MyChart   Notes: Nothing significant to report at this time.

## 2024-05-01 ENCOUNTER — Ambulatory Visit

## 2024-06-12 ENCOUNTER — Other Ambulatory Visit: Payer: Self-pay | Admitting: Family Medicine

## 2024-06-12 DIAGNOSIS — Z1231 Encounter for screening mammogram for malignant neoplasm of breast: Secondary | ICD-10-CM

## 2024-07-16 ENCOUNTER — Other Ambulatory Visit: Payer: Self-pay | Admitting: Urology

## 2024-07-16 DIAGNOSIS — R3915 Urgency of urination: Secondary | ICD-10-CM

## 2024-07-16 DIAGNOSIS — N3941 Urge incontinence: Secondary | ICD-10-CM

## 2024-07-16 DIAGNOSIS — R350 Frequency of micturition: Secondary | ICD-10-CM

## 2024-07-16 DIAGNOSIS — N3281 Overactive bladder: Secondary | ICD-10-CM

## 2024-07-22 ENCOUNTER — Ambulatory Visit
Admission: RE | Admit: 2024-07-22 | Discharge: 2024-07-22 | Disposition: A | Discharge status: Home / Self Care | Source: Ambulatory Visit | Attending: Family Medicine

## 2024-07-22 DIAGNOSIS — Z1231 Encounter for screening mammogram for malignant neoplasm of breast: Secondary | ICD-10-CM

## 2024-08-29 ENCOUNTER — Ambulatory Visit (HOSPITAL_COMMUNITY)
Admission: RE | Admit: 2024-08-29 | Discharge: 2024-08-29 | Disposition: A | Discharge status: Home / Self Care | Source: Ambulatory Visit | Attending: Urology | Admitting: Urology

## 2024-08-29 DIAGNOSIS — N1330 Unspecified hydronephrosis: Secondary | ICD-10-CM | POA: Insufficient documentation

## 2024-08-31 NOTE — Progress Notes (Signed)
 Assessment/Plan:     Ashley [name] is a very pleasant 78 y.o. year old RH female with a history of hypertension, hyperlipidemia, CAD, osteopenia, history of goiter, seen today for evaluation of memory loss. MoCA today is 22/30.  Etiology is unclear, workup is in progress.  Etiology is unclear. Patient is able to participate on ADLs and to drive without difficulties.       Memory Impairment of unclear etiology   MRI brain without contrast to assess for underlying structural abnormality and assess vascular load  Neurocognitive testing to further evaluate cognitive concerns and determine other underlying cause of memory changes, including potential contribution from sleep, anxiety, attention, or depression among others  Check B12, TSH Pending on the results will entertain [facility] Recommend good control of cardiovascular risk factors.   Continue to control mood as per PCP Folllow up in 1 month   Subjective:    The patient is accompanied by her daughter and husband (who has dementia) who supplements  the history.    How long did patient have memory difficulties? She denies any issues,  For about 1 year-daughter says we are concerned about mom, we want to make sure she is ok.  Patient reports some difficulty remembering new information, recent conversations, names, appointments. LTM is good. Likes to play cards  repeats oneself?  Endorsed for the last year Disoriented when walking into a room? Denies    Leaving objects in unusual places?  Denies.   Wandering behavior? Denies.   Any personality changes, or depression, anxiety? Denies, but is concerned that I have a memory problem I will not be able to play pickleball?-says tearfully   Hallucinations or paranoia? Denies.   Seizures? Denies.    Any sleep changes?  Does not sleep well. Denies frequent nightmares or dream reenactment, other REM behavior or sleepwalking   Sleep apnea? Denies.   Any hygiene concerns?  Denies.    Independent of bathing and dressing? Endorsed  Does the patient need help with medications? Patient  is in charge   Who is in charge of the finances?  Patient is in charge     Any changes in appetite?   Denies.drinks plenty  water       Patient have trouble swallowing?  Denies.   Does the patient cook?  yes, denies forgetting common recipes or kitchen accidents   Any headaches?  Denies.   Chronic pain? Denies.   Ambulates with difficulty? Denies. She enjoys line dancing 1-2 times a week, and playing pickle ball at the recreation center.   Recent falls or head injuries? Denies.     Vision changes?  Denies any new issues.   Any strokelike symptoms? Denies.   Any tremors? Denies.   Any anosmia? Denies.   Any incontinence of urine? Endorsed. She does Kegel exercises, wears diapers  Any bowel dysfunction? Denies.      Patient lives with her husband with dementia, daughter lives next door.    History of heavy alcohol intake? Denies.   History of heavy tobacco use? Denies.   Family history of dementia? Denies   Does patient drive? Yes, denies getting lost.   High School      Allergies  Allergen Reactions   Sulfa  Antibiotics Swelling    Angoedema   Demerol Nausea And Vomiting   Lisinopril  Swelling    angioedema . Uncertain if due to Sulfa  or from ACE-I but ACE-I was discontinued to be safe.   Moviprep [Peg-Kcl-Nacl-Nasulf-Na Asc-C] Nausea And Vomiting  Declomycin [Demeclocycline] Rash    Current Outpatient Medications  Medication Instructions   alendronate  (FOSAMAX ) 70 mg, Oral, Every 7 days, Take with a full glass of water  on an empty stomach.   amLODipine  (NORVASC ) 2.5 mg, Oral, Daily   aspirin EC 81 mg, Daily at bedtime   Calcium  Carb-Cholecalciferol (OS-CAL CALCIUM  + D3 PO) 1 tablet, Daily   cholecalciferol (VITAMIN D ) 1,000 Units, Daily   cyanocobalamin (VITAMIN B12) 1,000 mcg, Daily   mirabegron  ER (MYRBETRIQ ) 25 mg, Oral, Daily   Omega-3 Fatty Acids (FISH OIL) 1200 MG  CAPS 1 capsule, Every morning   rosuvastatin  (CRESTOR ) 40 mg, Oral, Daily     VITALS:   Vitals:   09/02/24 1321  BP: (!) 152/62  Pulse: 72  SpO2: 98%  Weight: 146 lb (66.2 kg)  Height: 5' (1.524 m)     Physical Exam  :    09/03/2024    5:00 AM  Montreal Cognitive Assessment   Visuospatial/ Executive (0/5) 3  Naming (0/3) 2  Attention: Read list of digits (0/2) 2  Attention: Read list of letters (0/1) 1  Attention: Serial 7 subtraction starting at 100 (0/3) 3  Language: Repeat phrase (0/2) 0  Language : Fluency (0/1) 1  Abstraction (0/2) 2  Delayed Recall (0/5) 2  Orientation (0/6) 5  Total 21  Adjusted Score (based on education) 22       02/19/2024    9:57 AM 02/13/2023    2:12 PM  MMSE - Mini Mental State Exam  Orientation to time 5 5  Orientation to Place 5 5  Registration 3 2  Attention/ Calculation 4 5  Recall 3 2  Language- name 2 objects 2 2  Language- repeat 1 1  Language- follow 3 step command 3 3  Language- read & follow direction 1 1  Write a sentence 1 1  Copy design 1 1  Total score 29 28       HEENT:  Normocephalic, atraumatic.  The superficial temporal arteries are without ropiness or tenderness. Cardiovascular: Regular rate and rhythm. Lungs: Clear to auscultation bilaterally. Neck: There are no carotid bruits noted bilaterally. Orientation:  Alert and oriented to person,time, not to place. No aphasia or dysarthria. Fund of knowledge is appropriate. Recent and remote memory impaired  Attention and concentration are normal.  Able to name objects 2/3 and  unable to repeat phrases.  Delayed recall 2/5  Cranial nerves: There is good facial symmetry. Anxious appearing. Extraocular muscles are intact and visual fields are full to confrontational testing. Speech is fluent and clear. No tongue deviation. Hearing is intact to conversational tone. Tone: Tone is good throughout. Sensation: Sensation is intact to light touch.  Vibration is intact at the  bilateral big toe.  Coordination: The patient has no difficulty with RAM's or FNF bilaterally. Normal finger to nose  Motor: Strength is 5/5 in the bilateral upper and lower extremities. There is no pronator drift. There are no fasciculations noted. DTR's: Deep tendon reflexes are 2/4 bilaterally. Gait and Station: The patient is able to ambulate without difficulty. Gait is cautious and narrow. Stride length is normal.        Thank you for allowing us  the opportunity to participate in the care of this nice patient. Please do not hesitate to contact us  for any questions or concerns.   Total time spent on today's visit was 45 minutes dedicated to this patient today, preparing to see patient, examining the patient, ordering tests and/or medications and counseling  the patient, documenting clinical information in the EHR or other health record, independently interpreting results and communicating results to the patient/family, discussing treatment and goals, answering patient's questions and coordinating care.  Cc:  Jolinda Norene HERO, DO  Camie Sevin 09/03/2024 5:34 AM

## 2024-09-02 ENCOUNTER — Ambulatory Visit: Admitting: Physician Assistant

## 2024-09-02 ENCOUNTER — Ambulatory Visit

## 2024-09-02 ENCOUNTER — Other Ambulatory Visit

## 2024-09-02 VITALS — BP 152/62 | HR 72 | Ht 60.0 in | Wt 146.0 lb

## 2024-09-02 DIAGNOSIS — R413 Other amnesia: Secondary | ICD-10-CM | POA: Diagnosis not present

## 2024-09-02 LAB — TSH: TSH: 3.61 m[IU]/L (ref 0.40–4.50)

## 2024-09-02 LAB — VITAMIN B12: Vitamin B-12: 1849 pg/mL — ABNORMAL HIGH (ref 200–1100)

## 2024-09-02 NOTE — Patient Instructions (Addendum)
 It was a pleasure to see you today at our office.   Recommendations:  Neurocognitive evaluation at our office   MRI of the brain, the radiology office will call you to arrange you appointment   Consider melatonin 3-5 mg when you wake up in the middle of the night  Check labs today   Follow up 3 months     [email or web address]   RECOMMENDATIONS FOR ALL PATIENTS WITH MEMORY PROBLEMS: 1. Continue to exercise (Recommend 30 minutes of walking everyday, or 3 hours every week) 2. Increase social interactions - continue going to [place] and enjoy social gatherings with friends and family 3. Eat healthy, avoid fried foods and eat more fruits and vegetables 4. Maintain adequate blood pressure, blood sugar, and blood cholesterol level. Reducing the risk of stroke and cardiovascular disease also helps promoting better memory. 5. Avoid stressful situations. Live a simple life and avoid aggravations. Organize your time and prepare for the next day in anticipation. 6. Sleep well, avoid any interruptions of sleep and avoid any distractions in the bedroom that may interfere with adequate sleep quality 7. Avoid sugar, avoid sweets as there is a strong link between excessive sugar intake, diabetes, and cognitive impairment We discussed the Mediterranean diet, which has been shown to help patients reduce the risk of progressive memory disorders and reduces cardiovascular risk. This includes eating fish, eat fruits and green leafy vegetables, nuts like almonds and hazelnuts, walnuts, and also use olive oil. Avoid fast foods and fried foods as much as possible. Avoid sweets and sugar as sugar use has been linked to worsening of memory function.  There is always a concern of gradual progression of memory problems. If this is the case, then we may need to adjust level of care according to patient needs. Support, both to the patient and caregiver, should then be  put into place.      You have been referred for a neuropsychological evaluation (i.e., evaluation of memory and thinking abilities). Please bring someone with you to this appointment if possible, as it is helpful for the doctor to hear from both you and another adult who knows you well. Please bring eyeglasses and hearing aids if you wear them.    The evaluation will take approximately 3 hours and has two parts:   The first part is a clinical interview with the neuropsychologist (Dr. Richie or Dr. Gayland). During the interview, the neuropsychologist will speak with you and the individual you brought to the appointment.    The second part of the evaluation is testing with the doctor's technician Neal or Luke). During the testing, the technician will ask you to remember different types of material, solve problems, and answer some questionnaires. Your family member will not be present for this portion of the evaluation.   Please note: We must reserve several hours of the neuropsychologist's time and the psychometrician's time for your evaluation appointment. As such, there is a No-Show fee of $100. If you are unable to attend any of your appointments, please contact our office as soon as possible to reschedule.      DRIVING: Regarding driving, in patients with progressive memory problems, driving will be impaired. We advise to have someone else do the driving if trouble finding directions or if minor accidents are reported. Independent driving assessment is available to determine safety of driving.   If you are interested in the driving assessment, you can contact the following:  [facility] [employer] [facility] in [place] [phone]  [facility] [phone]  [facility] [phone]  [facility] [phone] or [phone]   FALL PRECAUTIONS: Be cautious when walking. Scan the area for obstacles that may increase the risk of trips and falls. When getting  up in the mornings, sit up at the edge of the bed for a few minutes before getting out of bed. Consider elevating the bed at the head end to avoid drop of blood pressure when getting up. Walk always in a well-lit room (use night lights in the walls). Avoid area rugs or power cords from appliances in the middle of the walkways. Use a walker or a cane if necessary and consider physical therapy for balance exercise. Get your eyesight checked regularly.  FINANCIAL OVERSIGHT: Supervision, especially oversight when making financial decisions or transactions is also recommended.  HOME SAFETY: Consider the safety of the kitchen when operating appliances like stoves, microwave oven, and blender. Consider having supervision and share cooking responsibilities until no longer able to participate in those. Accidents with firearms and other hazards in the house should be identified and addressed as well.   ABILITY TO BE LEFT ALONE: If patient is unable to contact 911 operator, consider using LifeLine, or when the need is there, arrange for someone to stay with patients. Smoking is a fire hazard, consider supervision or cessation. Risk of wandering should be assessed by caregiver and if detected at any point, supervision and safe proof recommendations should be instituted.  MEDICATION SUPERVISION: Inability to self-administer medication needs to be constantly addressed. Implement a mechanism to ensure safe administration of the medications.      Mediterranean Diet A Mediterranean diet refers to food and lifestyle choices that are based on the traditions of countries located on the [employer]. This way of eating has been shown to help prevent certain conditions and improve outcomes for people who have chronic diseases, like kidney disease and heart disease. What are tips for following this plan? Lifestyle  Cook and eat meals together with your family, when possible. Drink enough fluid to keep your urine  clear or pale yellow. Be physically active every day. This includes: Aerobic exercise like running or swimming. Leisure activities like gardening, walking, or housework. Get 7-8 hours of sleep each night. If recommended by your health care provider, drink red wine in moderation. This means 1 glass a day for nonpregnant women and 2 glasses a day for men. A glass of wine equals 5 oz (150 mL). Reading food labels  Check the serving size of packaged foods. For foods such as rice and pasta, the serving size refers to the amount of cooked product, not dry. Check the total fat in packaged foods. Avoid foods that have saturated fat or trans fats. Check the ingredients list for added sugars, such as corn syrup. Shopping  At the grocery store, buy most of your food from the areas near the walls of the store. This includes: Fresh fruits and vegetables (produce). Grains, beans, nuts, and seeds. Some of these may be available in unpackaged forms or large amounts (in bulk). Fresh seafood. Poultry and eggs. Low-fat dairy products. Buy whole ingredients instead of prepackaged foods. Buy fresh fruits and vegetables in-season from local farmers markets. Buy frozen fruits and vegetables in resealable bags. If you do not have access to quality fresh seafood, buy precooked frozen shrimp or canned fish, such as tuna, salmon, or sardines. Buy small amounts of raw or cooked vegetables, salads, or olives from the deli or salad bar at your  store. [employer] so you always have certain foods on hand, such as olive oil, canned tuna, canned tomatoes, rice, pasta, and beans. Cooking  Cook foods with extra-virgin olive oil instead of using butter or other vegetable oils. Have meat as a side dish, and have vegetables or grains as your main dish. This means having meat in small portions or adding small amounts of meat to foods like pasta or stew. Use beans or vegetables instead of meat in common dishes like chili or  lasagna. Experiment with different cooking methods. Try roasting or broiling vegetables instead of steaming or sauteing them. Add frozen vegetables to soups, stews, pasta, or rice. Add nuts or seeds for added healthy fat at each meal. You can add these to yogurt, salads, or vegetable dishes. Marinate fish or vegetables using olive oil, lemon juice, garlic, and fresh herbs. Meal planning  Plan to eat 1 vegetarian meal one day each week. Try to work up to 2 vegetarian meals, if possible. Eat seafood 2 or more times a week. Have healthy snacks readily available, such as: Vegetable sticks with hummus. Greek yogurt. Fruit and nut trail mix. Eat balanced meals throughout the week. This includes: Fruit: 2-3 servings a day Vegetables: 4-5 servings a day Low-fat dairy: 2 servings a day Fish, poultry, or lean meat: 1 serving a day Beans and legumes: 2 or more servings a week Nuts and seeds: 1-2 servings a day Whole grains: 6-8 servings a day Extra-virgin olive oil: 3-4 servings a day Limit red meat and sweets to only a few servings a month What are my food choices? Mediterranean diet Recommended Grains: Whole-grain pasta. Brown rice. Bulgar wheat. Polenta. Couscous. Whole-wheat bread. [place] Made[place]. Vegetables: Artichokes. Beets. Broccoli. Cabbage. Carrots. Eggplant. Green beans. Chard. Kale. Spinach. Onions. Leeks. Peas. Squash. Tomatoes. Peppers. Radishes. Fruits: Apples. Apricots. Avocado. Berries. Bananas. Cherries. Dates. Figs. Grapes. Lemons. Melon. Oranges. Peaches. Plums. Pomegranate. Meats and other protein foods: Beans. Almonds. Sunflower seeds. Pine nuts. Peanuts. Cod. Salmon. Scallops. Shrimp. Tuna. Tilapia. Clams. Oysters. Eggs. Dairy: Low-fat milk. Cheese. Greek yogurt. Beverages: Water . Red wine. Herbal tea. Fats and oils: Extra virgin olive oil. Avocado oil. Grape seed oil. Sweets and desserts: [place] yogurt with honey. Baked apples. Poached pears. Trail mix. Seasoning and  other foods: Basil. Cilantro. Coriander. Cumin. Mint. Parsley. Sage. Rosemary. Tarragon. Garlic. Oregano. Thyme. Pepper. Balsalmic vinegar. Tahini. Hummus. Tomato sauce. Olives. Mushrooms. Limit these Grains: Prepackaged pasta or rice dishes. Prepackaged cereal with added sugar. Vegetables: Deep fried potatoes (french fries). Fruits: Fruit canned in syrup. Meats and other protein foods: Beef. Pork. Lamb. Poultry with skin. Hot dogs. [place]. Dairy: Ice cream. Sour cream. Whole milk. Beverages: Juice. Sugar-sweetened soft drinks. Beer. Liquor and spirits. Fats and oils: Butter. Canola oil. Vegetable oil. Beef fat (tallow). Lard. Sweets and desserts: Cookies. Cakes. Pies. Candy. Seasoning and other foods: Mayonnaise. Premade sauces and marinades. The items listed may not be a complete list. Talk with your dietitian about what dietary choices are right for you. Summary The Mediterranean diet includes both food and lifestyle choices. Eat a variety of fresh fruits and vegetables, beans, nuts, seeds, and whole grains. Limit the amount of red meat and sweets that you eat. Talk with your health care provider about whether it is safe for you to drink red wine in moderation. This means 1 glass a day for nonpregnant women and 2 glasses a day for men. A glass of wine equals 5 oz (150 mL). This information is not intended to replace  advice given to you by your health care provider. Make sure you discuss any questions you have with your health care provider. Document Released: 06/23/2016 Document Revised: 07/26/2016 Document Reviewed: 06/23/2016 Elsevier Interactive Patient Education  2017 [employer].

## 2024-09-03 ENCOUNTER — Ambulatory Visit: Payer: Self-pay | Admitting: Physician Assistant

## 2024-09-03 NOTE — Progress Notes (Signed)
 Submitted prior authorization on [employer]. Approved. Authorization Number: [id number] Case Number: [id number].Expires: 03/02/2025

## 2024-09-03 NOTE — Progress Notes (Signed)
 No answer at 11:50 09/03/2024

## 2024-09-03 NOTE — Progress Notes (Signed)
 Called [employer] Reference#:[id number]. Prior authorization needs to be completed through [employer].

## 2024-09-04 ENCOUNTER — Ambulatory Visit: Admitting: Family Medicine

## 2024-09-04 ENCOUNTER — Encounter: Payer: Self-pay | Admitting: Family Medicine

## 2024-09-04 VITALS — BP 128/59 | HR 75 | Temp 97.9°F | Ht 60.0 in | Wt 146.2 lb

## 2024-09-04 DIAGNOSIS — R32 Unspecified urinary incontinence: Secondary | ICD-10-CM

## 2024-09-04 DIAGNOSIS — M8580 Other specified disorders of bone density and structure, unspecified site: Secondary | ICD-10-CM

## 2024-09-04 DIAGNOSIS — L570 Actinic keratosis: Secondary | ICD-10-CM

## 2024-09-04 DIAGNOSIS — I10 Essential (primary) hypertension: Secondary | ICD-10-CM

## 2024-09-04 DIAGNOSIS — R159 Full incontinence of feces: Secondary | ICD-10-CM | POA: Diagnosis not present

## 2024-09-04 DIAGNOSIS — I2510 Atherosclerotic heart disease of native coronary artery without angina pectoris: Secondary | ICD-10-CM

## 2024-09-04 DIAGNOSIS — Z0001 Encounter for general adult medical examination with abnormal findings: Secondary | ICD-10-CM | POA: Diagnosis not present

## 2024-09-04 DIAGNOSIS — I2583 Coronary atherosclerosis due to lipid rich plaque: Secondary | ICD-10-CM

## 2024-09-04 DIAGNOSIS — E7800 Pure hypercholesterolemia, unspecified: Secondary | ICD-10-CM

## 2024-09-04 DIAGNOSIS — K640 First degree hemorrhoids: Secondary | ICD-10-CM

## 2024-09-04 DIAGNOSIS — Z0000 Encounter for general adult medical examination without abnormal findings: Secondary | ICD-10-CM

## 2024-09-04 LAB — BAYER DCA HB A1C WAIVED: HB A1C (BAYER DCA - WAIVED): 5.6 % (ref 4.8–5.6)

## 2024-09-04 MED ORDER — ROSUVASTATIN CALCIUM 40 MG PO TABS: 40.0000 mg | ORAL_TABLET | Freq: Every day | ORAL | 3 refills | Status: AC

## 2024-09-04 MED ORDER — AMLODIPINE BESYLATE 2.5 MG PO TABS: 2.5000 mg | ORAL_TABLET | Freq: Every day | ORAL | 3 refills | Status: AC

## 2024-09-04 NOTE — Progress Notes (Signed)
 Ashley [name] is a 78 y.o. female presents to office today for annual physical exam examination.    Patient is accompanied today's visit by her spouse, whom she cares for, and her daughter Naomie.  She actually reports no concerns initially but her daughter interjects and notes that she continues to have difficulty with fecal incontinence and urinary incontinence.  She is seeing urology and does home physical therapy for pelvic floor but has never been seen formally by physical therapist.  She continues to wear adult diapers.  She reports no pain or blood with urination or defecation.  She will be undergoing an MRI soon for memory loss.  She has a cyst on her back but thinks it looks okay.  Occupation: retired, Marital status: married, Substance use: none There are no preventive care reminders to display for this patient.  Immunization History  Administered Date(s) Administered   Fluad Quad(high Dose 65+) 07/24/2020, 09/14/2022   Fluzone  Influenza virus vaccine,trivalent (IIV3), split virus 08/17/2011, 08/15/2012, 08/15/2013, 08/20/2014, 08/18/2016   INFLUENZA, HIGH DOSE SEASONAL PF 09/06/2017, 08/29/2023   Influenza,inj,Quad PF,6+ Mos 08/16/2018   Influenza,inj,quad, With Preservative 08/19/2015   Influenza-Unspecified 08/16/2018, 08/08/2021, 08/15/2023   Pneumococcal Conjugate-13 05/04/2020   Pneumococcal Polysaccharide-23 08/18/2016   Tdap 02/13/2023   Zoster Recombinant(Shingrix) 11/17/2021, 03/01/2022   Past Medical History:  Diagnosis Date   Coronary artery disease 01/08/2010   STATUS POST STENTING OF THE LAD AND THE FIRST OBTUSE MARGINAL VESSEL   Diverticulitis    Headache(784.0)    Herpes zoster    Hyperlipidemia    Hypertension    Phlebitis Right   Leg   PONV (postoperative nausea and vomiting)    Stomach ulcer    Social History   Socioeconomic History   Marital status: Married    Spouse name: Elna   Number of children: 5   Years of education: GED    Highest education level: GED or equivalent  Occupational History   Occupation: retired  Tobacco Use   Smoking status: Never   Smokeless tobacco: Never  Vaping Use   Vaping status: Never Used  Substance and Sexual Activity   Alcohol use: Yes    Comment: social-wine   Drug use: No   Sexual activity: Yes    Birth control/protection: Surgical  Other Topics Concern   Not on file  Social History Narrative   One level home with her husband    Husband has dementia and she is sole caregiver   Daughter lives next door   Enjoys line-dancing 1-2x per week and playing pickle ball at [employer]   Social Drivers of Health   Financial Resource Strain: Low Risk  (04/17/2024)   Overall Financial Resource Strain (CARDIA)    Difficulty of Paying Living Expenses: Not hard at all  Food Insecurity: No Food Insecurity (04/17/2024)   Hunger Vital Sign    Worried About Running Out of Food in the Last Year: Never true    Ran Out of Food in the Last Year: Never true  Transportation Needs: No Transportation Needs (04/17/2024)   PRAPARE - Administrator, Civil Service (Medical): No    Lack of Transportation (Non-Medical): No  Physical Activity: Sufficiently Active (04/17/2024)   Exercise Vital Sign    Days of Exercise per Week: 7 days    Minutes of Exercise per Session: 30 min  Stress: No Stress Concern Present (04/17/2024)   [employer] of Occupational Health - Occupational Stress Questionnaire    Feeling of Stress :  Not at all  Social Connections: Moderately Integrated (04/17/2024)   Social Connection and Isolation Panel    Frequency of Communication with Friends and Family: Once a week    Frequency of Social Gatherings with Friends and Family: Once a week    Attends Religious Services: More than 4 times per year    Active Member of [employer] or Organizations: Yes    Attends Banker Meetings: Never    Marital Status: Married  Catering manager Violence: Not At Risk (04/17/2024)    Humiliation, Afraid, Rape, and Kick questionnaire    Fear of Current or Ex-Partner: No    Emotionally Abused: No    Physically Abused: No    Sexually Abused: No   Past Surgical History:  Procedure Laterality Date   ABDOMINAL HYSTERECTOMY     with bladder tack   BREAST BIOPSY Left    Normal   Bunionectomy Right    CARDIAC CATHETERIZATION  01/08/2010   CARDIAC CATHETERIZATION  01/05/2010   NORMAL LEFT VENTRICULAR SIZE AND NORMAL SYSTOLIC FUNCTION. EF IS 60%.    CATARACT EXTRACTION     CATARACT EXTRACTION Bilateral    COLONOSCOPY  07/13/2011   RMR:Lax anal sphincter tone otherwise normal/left sided diverticula, polyp removed but not analyzed   COLONOSCOPY N/A 09/21/2016   Procedure: COLONOSCOPY;  Surgeon: Lamar CHRISTELLA Hollingshead, MD;  Location: [facility];  Service: Endoscopy;  Laterality: N/A;  1:00 pm   COLONOSCOPY WITH PROPOFOL  N/A 06/28/2023   Procedure: COLONOSCOPY WITH PROPOFOL ;  Surgeon: Hollingshead Lamar CHRISTELLA, MD;  Location: [facility];  Service: Endoscopy;  Laterality: N/A;  215pm, asa 3, pt knows to arrive at 11:05   CORONARY STENT PLACEMENT     CYST EXCISION Left 01/27/2023   Procedure: LEFT THUMB MUCOID CYST EXCISION AND DEBRIDEMENT INTERPHALANGEAL JOINT;  Surgeon: Murrell Drivers, MD;  Location: [facility];  Service: Orthopedics;  Laterality: Left;  30 MIN   POLYPECTOMY  06/28/2023   Procedure: POLYPECTOMY INTESTINAL;  Surgeon: Hollingshead Lamar CHRISTELLA, MD;  Location: [facility];  Service: Endoscopy;;   VAGINAL HYSTERECTOMY     VEIN LIGATION Bilateral    Family History  Problem Relation Age of Onset   Emphysema Father    Colon cancer Father        82s   Heart disease Sister    Heart disease Maternal Grandmother    Hypertension Maternal Grandmother    Heart disease Paternal Grandmother    Hypertension Paternal Grandmother    Lupus Child    Breast cancer Neg Hx     Current Outpatient Medications:    alendronate  (FOSAMAX ) 70 MG tablet, Take 1 tablet (70 mg total) by  mouth every 7 (seven) days. Take with a full glass of water  on an empty stomach. (Patient not taking: Reported on 09/02/2024), Disp: 12 tablet, Rfl: 4   amLODipine  (NORVASC ) 2.5 MG tablet, Take 1 tablet (2.5 mg total) by mouth daily., Disp: 90 tablet, Rfl: 3   aspirin EC 81 MG tablet, Take 81 mg by mouth at bedtime., Disp: , Rfl:    Calcium  Carb-Cholecalciferol (OS-CAL CALCIUM  + D3 PO), Take 1 tablet by mouth daily. Calcium  1200 mg with Vitamin D , Disp: , Rfl:    cholecalciferol (VITAMIN D ) 1000 units tablet, Take 1,000 Units by mouth daily., Disp: , Rfl:    mirabegron  ER (MYRBETRIQ ) 25 MG TB24 tablet, TAKE 1 TABLET DAILY, Disp: 90 tablet, Rfl: 3   Omega-3 Fatty Acids (FISH OIL) 1200 MG CAPS, Take 1 capsule  by mouth every morning., Disp: , Rfl:    rosuvastatin  (CRESTOR ) 40 MG tablet, TAKE ONE TABLET BY MOUTH DAILY, Disp: 90 tablet, Rfl: 3   vitamin B-12 (CYANOCOBALAMIN) 1000 MCG tablet, Take 1,000 mcg by mouth daily., Disp: , Rfl:   Allergies  Allergen Reactions   Sulfa  Antibiotics Swelling    Angoedema   Demerol Nausea And Vomiting   Lisinopril  Swelling    angioedema . Uncertain if due to Sulfa  or from ACE-I but ACE-I was discontinued to be safe.   Moviprep [Peg-Kcl-Nacl-Nasulf-Na Asc-C] Nausea And Vomiting   Declomycin [Demeclocycline] Rash     ROS: Review of Systems Pertinent items noted in HPI and remainder of comprehensive ROS otherwise negative.    Physical exam BP (!) 128/59   Pulse 75   Temp 97.9 F (36.6 C)   Ht 5' (1.524 m)   Wt 146 lb 4 oz (66.3 kg)   SpO2 98%   BMI 28.56 kg/m  General appearance: alert, cooperative, appears stated age, and no distress Head: Normocephalic, without obvious abnormality, atraumatic Eyes: negative findings: lids and lashes normal, conjunctivae and sclerae normal, corneas clear, and pupils equal, round, reactive to light and accomodation Ears: normal TM's and external ear canals both ears Nose: Has a flesh-colored hyperkeratotic lesion on  the left aspect of her nose near the tip.  Turbinates moist Throat: lips, mucosa, and tongue normal; teeth and gums normal Neck: no adenopathy, no carotid bruit, supple, symmetrical, trachea midline, and thyroid  not enlarged, symmetric, no tenderness/mass/nodules Back: Increased kyphosis with slightly hunched station but ambulating independently with normal gait Lungs: clear to auscultation bilaterally Heart: regular rate and rhythm, S1, S2 normal, no murmur, click, rub or gallop Abdomen: soft, non-tender; bowel sounds normal; no masses,  no organomegaly Extremities: Mild to moderate edema present in the right lower extremity.  Has compression hose in place. Pulses: 2+ and symmetric Skin: Skin cyst appreciated in the left back.  She has hyperkeratotic lesion consistent with actinic keratosis on the left side of her nose as above Lymph nodes: Anterior cervical lymph node and supraclavicular lymph node without enlargement or abnormality palpable today Neurologic: Grossly normal      04/17/2024    2:51 PM 02/19/2024    9:08 AM 10/19/2023   10:10 AM  Depression screen PHQ 2/9  Decreased Interest 0 0 0  Down, Depressed, Hopeless 0 0 0  PHQ - 2 Score 0 0 0  Altered sleeping 0 0 0  Tired, decreased energy 0 0 0  Change in appetite 0 0 0  Feeling bad or failure about yourself  0 0 0  Trouble concentrating 0 0 0  Moving slowly or fidgety/restless 0 0 0  Suicidal thoughts 0 0 0  PHQ-9 Score 0 0 0  Difficult doing work/chores Not difficult at all Not difficult at all Not difficult at all      02/19/2024    9:08 AM 10/19/2023   10:10 AM 08/15/2023    1:25 PM 05/23/2022    1:37 PM  GAD 7 : Generalized Anxiety Score  Nervous, Anxious, on Edge 0 0 0 0  Control/stop worrying 0 0 0 0  Worry too much - different things 0 0 0 0  Trouble relaxing 0 0 0 0  Restless 0 0 0 0  Easily annoyed or irritable 0 0 0 0  Afraid - awful might happen 0 0 0 0  Total GAD 7 Score 0 0 0 0  Anxiety Difficulty Not  difficult at all  Not difficult at all Not difficult at all Not difficult at all    Recent Results (from the past 2160 hours)  TSH     Status: None   Collection Time: 09/02/24  2:55 PM  Result Value Ref Range   TSH 3.61 0.40 - 4.50 mIU/L  Vitamin B12     Status: Abnormal   Collection Time: 09/02/24  2:55 PM  Result Value Ref Range   Vitamin B-12 1,849 (H) 200 - 1,100 pg/mL   Cryotherapy Procedure:  Risks and benefits of procedure were reviewed with the patient.  Written consent obtained and scanned into the chart.  Lesion of concern was identified and located on left side of nose.  Liquid nitrogen was applied to area of concern and extending out 1 millimeters beyond the border of the lesion.  Treated area was allowed to come back to room temperature before treating it a second time.  Patient tolerated procedure well and there were no immediate complications.  Home care instructions were reviewed with the patient and a handout was provided.   Assessment/ Plan: Ashley [name] here for annual physical exam.   Annual physical exam  Actinic keratosis  Urinary incontinence, unspecified type - Plan: Ambulatory referral to Physical Therapy  Incontinence of feces, unspecified fecal incontinence type - Plan: Ambulatory referral to Physical Therapy  Coronary artery disease due to lipid rich plaque - Plan: CMP14+EGFR, Lipid Panel, Bayer DCA Hb A1c Waived, rosuvastatin  (CRESTOR ) 40 MG tablet  Hypercholesterolemia - Plan: CMP14+EGFR, Bayer DCA Hb A1c Waived, rosuvastatin  (CRESTOR ) 40 MG tablet  Essential hypertension - Plan: CMP14+EGFR, Bayer DCA Hb A1c Waived, amLODipine  (NORVASC ) 2.5 MG tablet  Osteopenia with high risk of fracture - Plan: CMP14+EGFR, VITAMIN D  25 Hydroxy (Vit-D Deficiency, Fractures)  Grade I internal hemorrhoids - Plan: CBC with Differential   Fasting labs collected for her today.  Her chronic issues are stable.  Meds are renewed.  Continue balanced diet with sufficient  calcium  and vitamin D  given history of osteopenia with high fracture risk.  She is not taking Fosamax  anymore due to side effects  Check CBC given history of internal hemorrhoids.  Though reports no rectal bleeding.  Referral to pelvic floor rehabilitation for incontinence placed today  Actinic keratosis treated with cryoablation today.  Counseled on healthy lifestyle choices, including diet (rich in fruits, vegetables and lean meats and low in salt and simple carbohydrates) and exercise (at least 30 minutes of moderate physical activity daily).  Patient to follow up 6-79m  Selam Pietsch M. Jolinda, DO

## 2024-09-05 LAB — CBC WITH DIFFERENTIAL/PLATELET
Basophils Absolute: 0.1 x10E3/uL (ref 0.0–0.2)
Basos: 1 %
EOS (ABSOLUTE): 0.4 x10E3/uL (ref 0.0–0.4)
Eos: 8 %
Hematocrit: 38.5 % (ref 34.0–46.6)
Hemoglobin: 12.2 g/dL (ref 11.1–15.9)
Immature Grans (Abs): 0 x10E3/uL (ref 0.0–0.1)
Immature Granulocytes: 0 %
Lymphocytes Absolute: 2 x10E3/uL (ref 0.7–3.1)
Lymphs: 36 %
MCH: 29 pg (ref 26.6–33.0)
MCHC: 31.7 g/dL (ref 31.5–35.7)
MCV: 91 fL (ref 79–97)
Monocytes Absolute: 0.6 x10E3/uL (ref 0.1–0.9)
Monocytes: 11 %
Neutrophils Absolute: 2.4 x10E3/uL (ref 1.4–7.0)
Neutrophils: 44 %
Platelets: 201 x10E3/uL (ref 150–450)
RBC: 4.21 x10E6/uL (ref 3.77–5.28)
RDW: 12.7 % (ref 11.7–15.4)
WBC: 5.4 x10E3/uL (ref 3.4–10.8)

## 2024-09-05 LAB — CMP14+EGFR
ALT: 18 IU/L (ref 0–32)
AST: 24 IU/L (ref 0–40)
Albumin: 4.2 g/dL (ref 3.8–4.8)
Alkaline Phosphatase: 57 IU/L (ref 49–135)
BUN/Creatinine Ratio: 15 (ref 12–28)
BUN: 13 mg/dL (ref 8–27)
Bilirubin Total: 0.4 mg/dL (ref 0.0–1.2)
CO2: 24 mmol/L (ref 20–29)
Calcium: 9.2 mg/dL (ref 8.7–10.3)
Chloride: 103 mmol/L (ref 96–106)
Creatinine, Ser: 0.89 mg/dL (ref 0.57–1.00)
Globulin, Total: 1.8 g/dL (ref 1.5–4.5)
Glucose: 83 mg/dL (ref 70–99)
Potassium: 4.3 mmol/L (ref 3.5–5.2)
Sodium: 141 mmol/L (ref 134–144)
Total Protein: 6 g/dL (ref 6.0–8.5)
eGFR: 66 mL/min/1.73 (ref >59)

## 2024-09-05 LAB — LIPID PANEL
Chol/HDL Ratio: 2.3 ratio (ref 0.0–4.4)
Cholesterol, Total: 133 mg/dL (ref 100–199)
HDL: 59 mg/dL (ref >39)
LDL Chol Calc (NIH): 59 mg/dL (ref 0–99)
Triglycerides: 78 mg/dL (ref 0–149)
VLDL Cholesterol Cal: 15 mg/dL (ref 5–40)

## 2024-09-05 LAB — VITAMIN D 25 HYDROXY (VIT D DEFICIENCY, FRACTURES): Vit D, 25-Hydroxy: 40.3 ng/mL (ref 30.0–100.0)

## 2024-09-06 ENCOUNTER — Ambulatory Visit: Payer: Self-pay | Admitting: Family Medicine

## 2024-09-10 ENCOUNTER — Ambulatory Visit: Payer: Self-pay | Admitting: Psychology

## 2024-09-10 ENCOUNTER — Ambulatory Visit: Admitting: Psychology

## 2024-09-10 ENCOUNTER — Encounter: Payer: Self-pay | Admitting: Psychology

## 2024-09-10 DIAGNOSIS — F0670 Mild neurocognitive disorder due to known physiological condition without behavioral disturbance: Secondary | ICD-10-CM | POA: Diagnosis not present

## 2024-09-10 DIAGNOSIS — G3184 Mild cognitive impairment, so stated: Secondary | ICD-10-CM | POA: Diagnosis not present

## 2024-09-10 DIAGNOSIS — R4189 Other symptoms and signs involving cognitive functions and awareness: Secondary | ICD-10-CM

## 2024-09-10 NOTE — Progress Notes (Unsigned)
 NEUROPSYCHOLOGICAL EVALUATION [place]. [facility] [place] Department of Neurology  Date of Evaluation: August 21, 2024  Reason for Referral:   Ashley [name] is a 78 y.o. right-handed Caucasian female referred by Camie Sevin, PA-C, to characterize her current cognitive functioning and assist with diagnostic clarity and treatment planning in the context of subjective cognitive decline.   Assessment and Plan:   Clinical Impression(s): Ashley [name] pattern of performance is suggestive of primary impairments surrounding both encoding (i.e., learning) and delayed retrieval aspects of memory. Additional deficits were exhibited across semantic fluency, whereas performance variability was exhibited across executive functioning, visuospatial abilities, and recognition/consolidation aspects of memory. Performances were appropriate relative to age-matched peers across processing speed, attention/concentration, safety/judgment, receptive language, phonemic fluency, and confrontation naming. Functionally, Ashley [name] denied difficulties completing instrumental activities of daily living (ADLs) independently. There was no collateral to confirm or refute this reporting. 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  The cause for ongoing cognitive dysfunction is unclear at the present time as Ashley [name]'s pattern across testing is somewhat nonspecific in nature. I cannot rule out the early beginnings of a neurodegenerative illness. Retention rates across memory testing ranged from 25% to 50% and she did not always benefit from cueing. This could suggest early concerns for rapid forgetting and an evolving storage impairment. Weakness in semantic fluency would also represent typical progression of an illness such as Alzheimer's disease. However, while I cannot rule out this  condition, it cannot be ruled in with confidence presently based on testing alone. Confrontation naming was intact and she did demonstrate some benefit from cueing, especially across a list-based memory task, both of which are encouraging. There is no neuroimaging available at the time of this writing to provide other anatomical information which could better explain memory deficits. Continued medical monitoring will be important moving forward.   Recommendations: A repeat neuropsychological evaluation in 12-24 months is recommended to assess the trajectory of future cognitive decline should it occur. This will also aid in future efforts towards improved diagnostic clarity.  I would recommend that Ashley [name] discuss medications aimed to address memory loss with Ashley [name] during their next appointment. It is important to highlight that these medications have been shown to slow functional decline in some individuals. There is no current treatment which can stop or reverse cognitive decline when caused by a neurodegenerative illness.   Performance across neurocognitive testing is not a strong predictor of an individual's safety operating a motor vehicle. Should her family wish to pursue a formalized driving evaluation, they could reach out to the following agencies: [employer] [facility] in [place]: [phone] [facility]: [phone] [facility]: [phone] [facility]: [phone] or [phone]  Should there be progression of current deficits over time, Ashley [name] is unlikely to regain any independent living skills lost. Therefore, it is recommended that she remain as involved as possible in all aspects of household chores, finances, and medication management, with supervision to ensure adequate performance. She will likely benefit from the establishment and maintenance of a routine in order to maximize her functional abilities over time.  It will  likely be beneficial for Ashley [name] to have another person with her when in situations where she may need to process information, weigh the pros and cons of different options, and make decisions, in order to ensure that she fully understands and recalls all  information to be considered.  If not already done, Ashley [name] and her family may want to discuss her wishes regarding durable power of attorney and medical decision making, so that she can have input into these choices. If they require legal assistance with this, long-term care resource access, or other aspects of estate planning, they could reach out to [employer] at [phone] for a free consultation. Additionally, they may wish to discuss future plans for caretaking and seek out community options for in home/residential care should they become necessary.  Ashley [name] is encouraged to attend to lifestyle factors for brain health (e.g., regular physical exercise, good nutrition habits and consideration of the MIND-DASH diet, regular participation in cognitively-stimulating activities, and general stress management techniques), which are likely to have benefits for both emotional adjustment and cognition. Optimal control of vascular risk factors (including safe cardiovascular exercise and adherence to dietary recommendations) is encouraged. Continued participation in activities which provide mental stimulation and social interaction is also recommended.   Important information should be provided to Ashley [name] in written format in all instances. This information should be placed in a highly frequented and easily visible location within her home to promote recall. External strategies such as written notes in a consistently used memory journal, visual and nonverbal auditory cues such as a calendar on the refrigerator or appointments with alarm, such as on a cell phone, can also help maximize recall.  When learning new information, she would benefit  from information being broken up into small, manageable pieces. She may also find it helpful to articulate the material in her own words and in a context to promote encoding at the onset of a new task. This material may need to be repeated multiple times to promote encoding.  To address problems with fluctuating attention and/or executive dysfunction, she may wish to consider:   -Avoiding external distractions when needing to concentrate   -Limiting exposure to fast paced environments with multiple sensory demands   -Writing down complicated information and using checklists   -Attempting and completing one task at a time (i.e., no multi-tasking)   -Verbalizing aloud each step of a task to maintain focus   -Taking frequent breaks during the completion of steps/tasks to avoid fatigue   -Reducing the amount of information considered at one time   -Scheduling more difficult activities for a time of day where she is usually most alert  Review of Records:   Past Medical History:  Diagnosis Date   Coronary artery disease 01/08/2010   STATUS POST STENTING OF THE LAD AND THE FIRST OBTUSE MARGINAL VESSEL   Diverticulitis    Dyslipidemia 11/20/2022   Essential hypertension 12/11/2018   Fecal incontinence 07/05/2011   Grade I internal hemorrhoids 11/10/2016   Headache    Herpes zoster    History of colonic polyps 09/01/2016   Hypercholesterolemia    Hyperlipidemia    Osteopenia with high risk of fracture 08/15/2023   Phlebitis Right   Leg   PONV (postoperative nausea and vomiting)    Postmenopausal atrophic vaginitis 07/23/2013   Rectal bleeding 09/01/2016   Stomach ulcer    Symptomatic menopausal or female climacteric states 07/23/2013   Urge incontinence 07/06/2023    Past Surgical History:  Procedure Laterality Date   ABDOMINAL HYSTERECTOMY     with bladder tack   BREAST BIOPSY Left    Normal   Bunionectomy Right    CARDIAC CATHETERIZATION  01/08/2010   CARDIAC CATHETERIZATION   01/05/2010   NORMAL  LEFT VENTRICULAR SIZE AND NORMAL SYSTOLIC FUNCTION. EF IS 60%.    CATARACT EXTRACTION     CATARACT EXTRACTION Bilateral    COLONOSCOPY  07/13/2011   RMR:Lax anal sphincter tone otherwise normal/left sided diverticula, polyp removed but not analyzed   COLONOSCOPY N/A 09/21/2016   Procedure: COLONOSCOPY;  Surgeon: Lamar CHRISTELLA Hollingshead, MD;  [facility];  Service: Endoscopy;  Laterality: N/A;  1:00 pm   COLONOSCOPY WITH PROPOFOL  N/A 06/28/2023   Procedure: COLONOSCOPY WITH PROPOFOL ;  Surgeon: Hollingshead Lamar CHRISTELLA, MD;  Location: [facility];  Service: Endoscopy;  Laterality: N/A;  215pm, asa 3, pt knows to arrive at 11:05   CORONARY STENT PLACEMENT     CYST EXCISION Left 01/27/2023   Procedure: LEFT THUMB MUCOID CYST EXCISION AND DEBRIDEMENT INTERPHALANGEAL JOINT;  Surgeon: Murrell Drivers, MD;  Location: [facility];  Service: Orthopedics;  Laterality: Left;  30 MIN   POLYPECTOMY  06/28/2023   Procedure: POLYPECTOMY INTESTINAL;  Surgeon: Hollingshead Lamar CHRISTELLA, MD;  Location: [facility];  Service: Endoscopy;;   VAGINAL HYSTERECTOMY     VEIN LIGATION Bilateral      Current Outpatient Medications:    amLODipine  (NORVASC ) 2.5 MG tablet, Take 1 tablet (2.5 mg total) by mouth daily., Disp: 90 tablet, Rfl: 3   aspirin EC 81 MG tablet, Take 81 mg by mouth at bedtime., Disp: , Rfl:    Calcium  Carb-Cholecalciferol (OS-CAL CALCIUM  + D3 PO), Take 1 tablet by mouth daily. Calcium  1200 mg with Vitamin D , Disp: , Rfl:    cholecalciferol (VITAMIN D ) 1000 units tablet, Take 1,000 Units by mouth daily., Disp: , Rfl:    mirabegron  ER (MYRBETRIQ ) 25 MG TB24 tablet, TAKE 1 TABLET DAILY, Disp: 90 tablet, Rfl: 3   Omega-3 Fatty Acids (FISH OIL) 1200 MG CAPS, Take 1 capsule by mouth every morning., Disp: , Rfl:    rosuvastatin  (CRESTOR ) 40 MG tablet, Take 1 tablet (40 mg total) by mouth daily., Disp: 90 tablet, Rfl: 3   vitamin B-12 (CYANOCOBALAMIN) 1000 MCG tablet, Take 1,000 mcg by  mouth daily., Disp: , Rfl:      02/19/2024    9:57 AM 02/13/2023    2:12 PM  MMSE - Mini Mental State Exam  Orientation to time 5 5  Orientation to Place 5 5  Registration 3 2  Attention/ Calculation 4 5  Recall 3 2  Language- name 2 objects 2 2  Language- repeat 1 1  Language- follow 3 step command 3 3  Language- read & follow direction 1 1  Write a sentence 1 1  Copy design 1 1  Total score 29 28      09/03/2024    5:00 AM  Montreal Cognitive Assessment   Visuospatial/ Executive (0/5) 3  Naming (0/3) 2  Attention: Read list of digits (0/2) 2  Attention: Read list of letters (0/1) 1  Attention: Serial 7 subtraction starting at 100 (0/3) 3  Language: Repeat phrase (0/2) 0  Language : Fluency (0/1) 1  Abstraction (0/2) 2  Delayed Recall (0/5) 2  Orientation (0/6) 5  Total 21  Adjusted Score (based on education) 22      04/17/2024    2:52 PM 02/01/2023    8:26 AM 11/04/2020    2:56 PM 06/04/2019   10:20 AM  6CIT Screen  What Year? 0 points 0 points 0 points 0 points  What month? 0 points 0 points 0 points 0 points  What time? 0 points 0 points  0 points 0 points  Count back from 20 0 points 0 points 0 points 0 points  Months in reverse 2 points 0 points 0 points 0 points  Repeat phrase 4 points 0 points 0 points 4 points  Total Score 6 points 0 points 0 points 4 points   Neuroimaging: No neuroimaging was available for review. A brain MRI was scheduled for 09/11/2024.   Clinical Interview:   The following information was obtained during a clinical interview with Ashley [name] prior to cognitive testing.  Cognitive Symptoms: Decreased short-term memory: Denied. Per medical records, Ashley [name] daughter has expressed concern surrounding progressive memory decline for the past year or so. Examples included trouble recalling names, appointments, and details of recent conversations. Her daughter also expressed concern surrounding increased repetition in day-to-day  conversation. Decreased long-term memory: Denied. Decreased attention/concentration: Denied. Reduced processing speed: Denied. Difficulties with executive functions: Denied. Difficulties with emotion regulation: Denied. Difficulties with receptive language: Denied. Difficulties with word finding: Denied. Decreased visuoperceptual ability: Denied.  Difficulties completing ADLs: Denied. Ashley [name] is in charge of her husband's medications and reported managing both his and her own appropriately. She noted that most bills are on auto-draft and denied concerns for financial mismanagement. She also denied any driving concerns.   Additional Medical History: History of traumatic brain injury/concussion: Denied. History of stroke: Denied. History of seizure activity: Denied. History of known exposure to toxins: Denied. Symptoms of chronic pain: Denied. Experience of frequent headaches/migraines: Denied. Frequent instances of dizziness/vertigo: Denied.  Sensory changes: Denied. She reported a history of cataract removal and the lifting of her eyelids.  Balance/coordination difficulties: Denied. She also denied any recent falls.  Other motor difficulties: Denied.  Other medical conditions: Medical records suggest ongoing difficulties with both urinary and fecal incontinence. She is seen by urology and does home physical therapy for pelvic floor but has never been seen formally by a physical therapist. She continues to wear adult diapers.   Sleep History: Estimated hours obtained each night: 5 hours. Ashley [name] described this as being fairly typical, noting that she has never required a large amount of sleep throughout her life.  Difficulties falling asleep: Denied. Difficulties staying asleep: Denied. Feels rested and refreshed upon awakening: Endorsed.  History of snoring: Denied. History of waking up gasping for air: Denied. Witnessed breath cessation while asleep: Denied.  History of  vivid dreaming: Denied. Excessive movement while asleep: Denied. Instances of acting out her dreams: Denied.  Psychiatric/Behavioral Health History: Depression: She described her current mood as positive and denied to her knowledge any previous mental health concerns or formal diagnoses. She did acknowledge some distress surrounding the potential for memory loss given that she serves as the primary caregiver for her husband who has dementia. Current or remote suicidal ideation, intent, or plan was denied.  Anxiety: Denied. Mania: Denied. Trauma History: Denied. Visual/auditory hallucinations: Denied. Delusional thoughts: Denied.  Tobacco: Denied. Alcohol: She denied current alcohol consumption as well as a history of problematic alcohol abuse or dependence.  Recreational drugs: Denied.  Family History: Problem Relation Age of Onset   Emphysema Father    Colon cancer Father        45s   Heart disease Sister    Heart disease Maternal Grandmother    Hypertension Maternal Grandmother    Heart disease Paternal Grandmother    Hypertension Paternal Grandmother    Lupus Child    Breast cancer Neg Hx    This information was confirmed by Ashley [name].  Academic/Vocational  History: Highest level of educational attainment: Her reporting surrounding her educational history was difficult to follow. She did not finish high school, stating that she left school before completing her freshman year to get married. 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 Given her initial reporting, relevant tasks were scored based on 8 years of completed formal education. She described herself as an average student in academic settings. She did report earning her GED approximately 30 years later.  History of developmental delay: Denied. History of grade repetition: Denied. Enrollment in special  education courses: Denied. History of LD/ADHD: Denied.  Employment: Retired. She previously worked education officer, environmental houses, as well as in a animal nutritionist.   Evaluation Results:   Behavioral Observations: Ashley [name] was unaccompanied, arrived to her appointment on time, and was appropriately dressed and groomed. She appeared alert. Observed gait and station were within normal limits. Gross motor functioning appeared intact upon informal observation and no abnormal movements (e.g., tremors) were noted. Her affect was generally relaxed and positive, but did range appropriately given the subject being discussed during interview. She was briefly tearful when discussing the potential for memory concerns and how that would impact her caring for her husband. There were also brief flashes of defensiveness as she highlighted her degree of physical activity as a reason for memory loss being unlikely. Spontaneous speech was fluent and word finding difficulties were not observed during the clinical interview. Thought processes were coherent, organized, and normal in content. Insight into her cognitive difficulties appeared poor as she denied all cognitive concerns during interview. However, it may be that Ms. Maradiaga was simply attempting to portray herself in a more favorable light during interview.  During testing, sustained attention was appropriate. Task engagement was adequate and she persisted when challenged. She was mildly repetitive in emphasizing that she could not possibly have any memory loss given how physically active she is. Overall, Ms. Allaire was cooperative with the clinical interview and subsequent testing procedures.   Adequacy of Effort: The validity of neuropsychological testing is limited by the extent to which the individual being tested may be assumed to have exerted adequate effort during testing. Ashley [name] expressed her intention to perform to the best of her abilities and exhibited  adequate task engagement and persistence. Scores across stand-alone and embedded performance validity measures were within expectation. As such, the results of the current evaluation are believed to be a valid representation of Ms. Hargett's current cognitive functioning.  Test Results: Ms. Halberg was largely oriented at the time of the current evaluation. She was one day off when stating the current date and was unable to provide the name of the current clinic.   Intellectual abilities based upon educational and vocational attainment were estimated to be in the below average to average range. Premorbid abilities were estimated to be within the below average range based upon a single-word reading test.   Processing speed was below average to average. Basic attention was average. More complex attention (e.g., working memory) was also average. Executive functioning was variable, ranging from the well below average to average normative ranges. She performed in the average range across a task assessing safety and judgment.   Assessed receptive language abilities were below average. Ms. Mewborn did not exhibit prominent difficulties comprehending task instructions and answered all questions asked of her appropriately. Assessed expressive language was somewhat variable.  Phonemic fluency was average, semantic fluency was exceptionally low to below average, and confrontation naming was average.   Assessed visuospatial/visuoconstructional abilities were variable, ranging from the well below average to average normative ranges.    Learning (i.e., encoding) of novel verbal information was exceptionally low. Spontaneous delayed recall (i.e., retrieval) of previously learned information was exceptionally low to below average. Retention rates were 50% (raw score of 2) across a list learning task, 25% (raw score of 1) across a story learning task, and 25% across a figure drawing task. Performance across recognition  tasks was variable, ranging from the well below average to average normative ranges, suggesting some limited evidence for information consolidation.   Results of emotional screening instruments suggested that recent symptoms of generalized anxiety were in the minimal range, while symptoms of depression were within normal limits. A screening instrument assessing recent sleep quality suggested the presence of minimal sleep dysfunction.  Table of Scores:   Note: This summary of test scores accompanies the interpretive report and should not be considered in isolation without reference to the appropriate sections in the text. Descriptors are based on appropriate normative data and may be adjusted based on clinical judgment. Terms such as Within Normal Limits and Outside Normal Limits are used when a more specific description of the test score cannot be determined.       Percentile - Normative Descriptor > 98 - Exceptionally High 91-97 - Well Above Average 75-90 - Above Average 25-74 - Average 9-24 - Below Average 2-8 - Well Below Average < 2 - Exceptionally Low       Validity:   DESCRIPTOR       DCT: --- --- Within Normal Limits  RBANS EI: --- --- Within Normal Limits       Orientation:      Raw Score Percentile   NAB Orientation, Form 1 27/29 --- ---       Cognitive Screening:      Raw Score Percentile   SLUMS: 21/30 --- ---       RBANS, Form A: Standard Score/ Scaled Score Percentile   Total Score 69 2 Well Below Average  Immediate Memory 53 <1 Exceptionally Low    List Learning 2 <1 Exceptionally Low    Story Memory 3 1 Exceptionally Low  Visuospatial/Constructional 75 5 Well Below Average    Figure Copy 7 16 Below Average    Line Orientation 11/20 3-9 Well Below Average  Language 82 12 Below Average    Picture Naming 10/10 51-75 Average    Semantic Fluency 3 1 Exceptionally Low  Attention 85 16 Below Average    Digit Span 8 25 Average    Coding 7 16 Below Average   Delayed Memory 82 12 Below Average    List Recall 2/10 10-16 Below Average    List Recognition 19/20 26-50 Average    Story Recall 2 <1 Exceptionally Low    Story Recognition 6/12 3-4 Well Below Average    Figure Recall 4 2 Well Below Average    Figure Recognition 4/8 9-20 Below Average        Intellectual Functioning:      Standard Score Percentile   Test of Premorbid Functioning: 84 14 Below Average       Attention/Executive Function:     Trail Making Test (TMT): Raw Score (T Score) Percentile     Part A 39 secs.,  0 errors (56) 73 Average    Part B 140 secs.,  2 errors (53)  62 Average         Scaled Score Percentile   WAIS-5 Coding: 8 25 Average  WAIS-5 Naming Speed Quantity: 10 50 Average        Scaled Score Percentile   WAIS-5 Digits Forwards: 8 25 Average  WAIS-5 Digit Sequencing: 8 25 Average        Scaled Score Percentile   WAIS-5 Similarities: 7 16 Below Average  WAIS-5 Figure Weights: 8 25 Average       D-KEFS Verbal Fluency Test: Raw Score (Scaled Score) Percentile     Letter Total Correct 34 (10) 50 Average    Category Total Correct 25 (7) 16 Below Average    Category Switching Total Correct 7 (4) 2 Well Below Average    Category Switching Accuracy 5 (4) 2 Well Below Average      Total Set Loss Errors 0 (13) 84 Above Average      Total Repetition Errors 7 (6) 9 Below Average       NAB Executive Functions Module, Form 1: T Score Percentile     Judgment 48 42 Average       Language:     Verbal Fluency Test: Raw Score (T Score) Percentile     Phonemic Fluency (FAS) 34 (51) 54 Average    Animal Fluency 11 (38) 12 Below Average        NAB Language Module, Form 1: T Score Percentile     Auditory Comprehension 40 16 Below Average    Naming 29/31 (51) 54 Average       Visuospatial/Visuoconstruction:      Raw Score Percentile   Clock Drawing: 9/10 --- Within Normal Limits        Scaled Score Percentile   WAIS-5 Block Design: 8 25 Average       Mood  and Personality:      Raw Score Percentile   Geriatric Depression Scale: 1 --- Within Normal Limits  Geriatric Anxiety Scale: 8 --- Minimal    Somatic 5 --- Minimal    Cognitive 1 --- Minimal    Affective 2 --- Minimal       Additional Questionnaires:      Raw Score Percentile   PROMIS Sleep Disturbance Questionnaire: 13 --- None to Slight   Informed Consent and Coding/Compliance:   The current evaluation represents a clinical evaluation for the purposes previously outlined by the referral source and is in no way reflective of a forensic evaluation.   Ms. Rollinson was provided with a verbal description of the nature and purpose of the present neuropsychological evaluation. Also reviewed were the foreseeable risks and/or discomforts and benefits of the procedure, limits of confidentiality, and mandatory reporting requirements of this provider. The patient was given the opportunity to ask questions and receive answers about the evaluation. Oral consent to participate was provided by the patient.   This evaluation was conducted by Arthea KYM Maryland, Ph.D., ABPP-CN, board certified clinical neuropsychologist. Ms. Kinnick completed a clinical interview with Dr. Maryland, billed as one unit [phone], and 140 minutes of cognitive testing and scoring, billed as one unit [phone] and four additional units 96139. Psychometrist Lonell Jude, B.S. assisted Dr. Maryland with test administration and scoring procedures. As a separate and discrete service, one unit [phone] and two units 96133 (160 minutes) were billed for Dr. Loralee time spent in interpretation and report writing.

## 2024-09-10 NOTE — Progress Notes (Signed)
   Psychometrician Note   Cognitive testing was administered to [facility] by Lonell Jude, B.S. (psychometrist) under the supervision of Dr. Arthea KYM Maryland, Ph.D., ABPP, licensed psychologist on 09/10/2024. Ashley [name] did not appear overtly distressed by the testing session per behavioral observation or responses across self-report questionnaires. Rest breaks were offered.   The battery of tests administered was selected by Dr. Zachary C. Merz, Ph.D., ABPP with consideration to Ashley [name]'s current level of functioning, the nature of her symptoms, emotional and behavioral responses during interview, level of literacy, observed level of motivation/effort, and the nature of the referral question. This battery was communicated to the psychometrist. Communication between Dr. Arthea KYM Maryland, Ph.D., ABPP and the psychometrist was ongoing throughout the evaluation and Dr. Arthea KYM Maryland, Ph.D., ABPP was immediately accessible at all times. Dr. Zachary C. Merz, Ph.D., ABPP provided supervision to the psychometrist on the date of this service to the extent necessary to assure the quality of all services provided.    Ashley [name] will return within approximately 1-2 weeks for an interactive feedback session with Dr. Maryland at which time her test performances, clinical impressions, and treatment recommendations will be reviewed in detail. Ashley [name] understands she can contact our office should she require our assistance before this time.  A total of 140 minutes of billable time were spent face-to-face with Ashley [name] by the psychometrist. This includes both test administration and scoring time. Billing for these services is reflected in the clinical report generated by Dr. Arthea KYM Maryland, Ph.D., ABPP  This note reflects time spent with the psychometrician and does not include test scores or any clinical interpretations made by Dr. Maryland. The full report will follow in a separate note.

## 2024-09-11 ENCOUNTER — Ambulatory Visit (HOSPITAL_COMMUNITY)
Admission: RE | Admit: 2024-09-11 | Discharge: 2024-09-11 | Disposition: A | Discharge status: Home / Self Care | Source: Ambulatory Visit | Attending: Physician Assistant | Admitting: Physician Assistant

## 2024-09-11 DIAGNOSIS — R413 Other amnesia: Secondary | ICD-10-CM | POA: Diagnosis present

## 2024-09-16 NOTE — Progress Notes (Signed)
 Patient advised.

## 2024-09-17 ENCOUNTER — Ambulatory Visit: Payer: Self-pay | Admitting: Psychology

## 2024-09-17 DIAGNOSIS — F0670 Mild neurocognitive disorder due to known physiological condition without behavioral disturbance: Secondary | ICD-10-CM | POA: Diagnosis not present

## 2024-09-17 DIAGNOSIS — G3184 Mild cognitive impairment, so stated: Secondary | ICD-10-CM | POA: Diagnosis not present

## 2024-09-17 NOTE — Progress Notes (Signed)
   Neuropsychology Feedback Session [name]. [facility]  Department of Neurology  Reason for Referral:   Ashley [name] is a 78 y.o. right-handed Caucasian female referred by Camie Sevin, PA-C, to characterize her current cognitive functioning and assist with diagnostic clarity and treatment planning in the context of subjective cognitive decline.   Feedback:   [name] completed a comprehensive neuropsychological evaluation on 09/10/2024. Please refer to that encounter for the full report and recommendations. Briefly, results suggested primary impairments surrounding both encoding (i.e., learning) and delayed retrieval aspects of memory. Additional deficits were exhibited across semantic fluency, whereas performance variability was exhibited across executive functioning, visuospatial abilities, and recognition/consolidation aspects of memory. The cause for ongoing cognitive dysfunction is unclear at the present time as [name]'s pattern across testing is somewhat nonspecific in nature. I cannot rule out the early beginnings of a neurodegenerative illness. Retention rates across memory testing ranged from 25% to 50% and she did not always benefit from cueing. This could suggest early concerns for rapid forgetting and an evolving storage impairment. Weakness in semantic fluency would also represent typical progression of an illness such as Alzheimer's disease. However, while I cannot rule out this condition, it cannot be ruled in with confidence presently based on testing alone. Confrontation naming was intact and she did demonstrate some benefit from cueing, especially across a list-based memory task, both of which are encouraging. Continued medical monitoring will be important moving forward.   [name] was accompanied by her husband and daughter during the current feedback session. Content of the current session focused on the results of her neuropsychological evaluation.  [name] was given the opportunity to ask questions and her questions were answered. She was encouraged to reach out should additional questions arise. A copy of her report was provided at the conclusion of the visit.      One unit 96132 (31 minutes) was billed for Dr. Loralee time spent preparing for, conducting, and documenting the current feedback session with [name].

## 2024-10-03 ENCOUNTER — Ambulatory Visit: Admitting: Family Medicine

## 2024-10-03 ENCOUNTER — Ambulatory Visit: Payer: Self-pay

## 2024-10-03 ENCOUNTER — Ambulatory Visit (INDEPENDENT_AMBULATORY_CARE_PROVIDER_SITE_OTHER)

## 2024-10-03 ENCOUNTER — Encounter: Payer: Self-pay | Admitting: Family Medicine

## 2024-10-03 VITALS — BP 149/63 | HR 73 | Ht 60.0 in | Wt 147.0 lb

## 2024-10-03 DIAGNOSIS — M25511 Pain in right shoulder: Secondary | ICD-10-CM

## 2024-10-03 DIAGNOSIS — S80219A Abrasion, unspecified knee, initial encounter: Secondary | ICD-10-CM

## 2024-10-03 DIAGNOSIS — W19XXXA Unspecified fall, initial encounter: Secondary | ICD-10-CM | POA: Diagnosis not present

## 2024-10-03 NOTE — Telephone Encounter (Signed)
Noted  -LS

## 2024-10-03 NOTE — Progress Notes (Signed)
 BP (!) 149/63   Pulse 73   Ht 5' (1.524 m)   Wt 147 lb (66.7 kg)   SpO2 96%   BMI 28.71 kg/m    Subjective:   Patient ID: Ashley [name], female    DOB: 1946/07/01, 78 y.o.   MRN: [id number]  HPI: Ashley [name] is a 78 y.o. female presenting on 10/03/2024 for Fall (Skin abrasions face and bilateral knees. Pain when raising right arm. Denies concussion sx's.)   Discussed the use of AI scribe software for clinical note transcription with the patient, who gave verbal consent to proceed.  History of Present Illness   Ashley [name] is a 78 year old female who presents after a fall resulting in multiple injuries.  Fall and mechanism of injury - Fall occurred yesterday due to tripping on the sidewalk while wearing shoes that were too large - Landed on concrete surface  Knee injuries - Scrapes present on both knees - Swelling of both knees - Topical antibiotic ointment applied to wounds - Lacks large bandages to cover wounds  Right shoulder pain and bruising - Pain and bruising localized to right shoulder - Pain worsened by raising arm - No significant pain with forward or backward shoulder movement - Specific area of tenderness identified  Facial swelling - Swelling present on the bridge of the nose - No other facial injuries reported  Functional status and activity limitation - Able to ambulate despite injuries - Participating in dance activities - Missed dance class today due to fall          Relevant past medical, surgical, family and social history reviewed and updated as indicated. Interim medical history since our last visit reviewed. Allergies and medications reviewed and updated.  Review of Systems  Constitutional:  Negative for chills and fever.  Eyes:  Negative for visual disturbance.  Respiratory:  Negative for chest tightness and shortness of breath.   Cardiovascular:  Negative for chest pain and leg swelling.  Musculoskeletal:  Positive for  arthralgias. Negative for back pain and gait problem.  Skin:  Positive for wound. Negative for rash.  Neurological:  Negative for light-headedness and headaches.  Psychiatric/Behavioral:  Negative for agitation and behavioral problems.   All other systems reviewed and are negative.   Per HPI unless specifically indicated above   Allergies as of 10/03/2024       Reactions   Sulfa  Antibiotics Swelling   Angoedema   Demerol Nausea And Vomiting   Lisinopril  Swelling   angioedema . Uncertain if due to Sulfa  or from ACE-I but ACE-I was discontinued to be safe.   Moviprep [peg-kcl-nacl-nasulf-na Asc-c] Nausea And Vomiting   Declomycin [demeclocycline] Rash        Medication List        Accurate as of October 03, 2024  2:50 PM. If you have any questions, ask your nurse or doctor.          amLODipine  2.5 MG tablet Commonly known as: NORVASC  Take 1 tablet (2.5 mg total) by mouth daily.   aspirin EC 81 MG tablet Take 81 mg by mouth at bedtime.   cholecalciferol 1000 units tablet Commonly known as: VITAMIN D  Take 1,000 Units by mouth daily.   cyanocobalamin 1000 MCG tablet Commonly known as: VITAMIN B12 Take 1,000 mcg by mouth daily.   Fish Oil 1200 MG Caps Take 1 capsule by mouth every morning.   mirabegron  ER 25 MG Tb24 tablet Commonly known as: MYRBETRIQ  TAKE 1 TABLET DAILY  OS-CAL CALCIUM  + D3 PO Take 1 tablet by mouth daily. Calcium  1200 mg with Vitamin D    rosuvastatin  40 MG tablet Commonly known as: CRESTOR  Take 1 tablet (40 mg total) by mouth daily.         Objective:   BP (!) 149/63   Pulse 73   Ht 5' (1.524 m)   Wt 147 lb (66.7 kg)   SpO2 96%   BMI 28.71 kg/m   Wt Readings from Last 3 Encounters:  10/03/24 147 lb (66.7 kg)  09/04/24 146 lb 4 oz (66.3 kg)  09/02/24 146 lb (66.2 kg)    Physical Exam Vitals and nursing note reviewed.  Constitutional:      General: She is not in acute distress.    Appearance: She is well-developed. She  is not diaphoretic.  Eyes:     Conjunctiva/sclera: Conjunctivae normal.  Cardiovascular:     Rate and Rhythm: Normal rate and regular rhythm.     Heart sounds: Normal heart sounds. No murmur heard. Pulmonary:     Effort: Pulmonary effort is normal. No respiratory distress.     Breath sounds: Normal breath sounds. No wheezing.  Musculoskeletal:        General: Normal range of motion.     Right shoulder: Tenderness and bony tenderness present. No effusion or crepitus. Normal range of motion.       Arms:  Skin:    General: Skin is warm and dry.     Findings: No rash.  Neurological:     Mental Status: She is alert and oriented to person, place, and time.     Coordination: Coordination normal.  Psychiatric:        Behavior: Behavior normal.       MUSCULOSKELETAL: Scrapes on both knees, swelling in both knees, bruise on right shoulder, small scrape on left hand.         Assessment & Plan:   Problem List Items Addressed This Visit   None Visit Diagnoses       Fall, initial encounter    -  Primary   Relevant Orders   DG Shoulder Right     Acute pain of right shoulder       Relevant Orders   DG Shoulder Right     Abrasion of knee, unspecified laterality, initial encounter               Right shoulder pain and contusion after fall Pain with movement, no instability or significant impairment. Differential: contusion vs. fracture. - Ordered x-ray of right shoulder to rule out fracture. - Will call with x-ray results.  Bilateral knee abrasions and swelling after fall Swelling present, no mobility or stability impairment. - Provided larger bandage for knee abrasions.  Left hand abrasion after fall No significant swelling or pain. - Provided smaller bandage for left hand abrasion.          Follow up plan: Return if symptoms worsen or fail to improve.  Counseling provided for all of the vaccine components Orders Placed This Encounter  Procedures   DG Shoulder  Right    Fonda Levins, MD [facility] 10/03/2024, 2:50 PM

## 2024-10-03 NOTE — Telephone Encounter (Signed)
 FYI Only or Action Required?: FYI only for provider: appointment scheduled on 11/20.  Patient was last seen in primary care on 09/04/2024 by Jolinda Norene HERO, DO.  Called Nurse Triage reporting Fall.  Symptoms began yesterday.  Interventions attempted: OTC medications: polysporin.  Symptoms are: gradually worsening.  Triage Disposition: See PCP When Office is Open (Within 3 Days)  Patient/caregiver understands and will follow disposition?: Yes  Copied from CRM #[id number]. Topic: Clinical - Red Word Triage >> Oct 03, 2024 11:20 AM Kevelyn M wrote: Red Word that prompted transfer to Nurse Triage: Patient fell yesterday and scraped knees and bruised on her shoulder, and busted her lip. Reason for Disposition  MILD weakness (e.g., does not interfere with ability to work, go to school, normal activities)  (Exception: Mild weakness is a chronic symptom.)  Answer Assessment - Initial Assessment Questions Clemens yesterday afternoon walking down a hill and tripped on her to large of sneakers. She denies LOC, Dizziness, or head injury. Hit shoulder, and knees and almost bit through her lip.Applied Polysporin on her scrapes She went to dinner and played cards that afternoon. Today her shoulder is bothering her and wants to be evaluated.  Appt with PCP office to evaluate. ED/UC precautions understood  1. MECHANISM: How did the fall happen?     Shoes are to big, walking down a hill and caught her shoe and fell onto the side walk  2. DOMESTIC VIOLENCE AND ELDER ABUSE SCREENING: Did you fall because someone pushed you or tried to hurt you? If Yes, ask: Are you safe now?     denies 3. ONSET: When did the fall happen? (e.g., minutes, hours, or days ago)     Yesterday  4. LOCATION: What part of the body hit the ground? (e.g., back, buttocks, head, hips, knees, hands, head, stomach)     Shoulder, knees, and bit her lip 5. INJURY: Did you hurt (injure) yourself when you fell? If Yes, ask:  What did you injure? Tell me more about this? (e.g., body area; type of injury; pain severity)     Shoulder scraped, knees scraped and bit her lip  6. PAIN: Is there any pain? If Yes, ask: How bad is the pain? (e.g., Scale 0-10; or none, mild,      Shoulder hurts- still able to move 7. SIZE: For cuts, bruises, or swelling, ask: How large is it? (e.g., inches or centimeters)      Not bad scrapes 9. OTHER SYMPTOMS: Do you have any other symptoms? (e.g., dizziness, fever, weakness; new-onset or worsening).      denies 10. CAUSE: What do you think caused the fall (or falling)? (e.g., dizzy spell, tripped)       Tripped on shoes  Protocols used: Falls and [facility]

## 2024-10-09 ENCOUNTER — Ambulatory Visit: Payer: Self-pay | Admitting: Family Medicine

## 2024-12-16 ENCOUNTER — Encounter: Payer: Self-pay | Admitting: Physician Assistant

## 2024-12-18 ENCOUNTER — Ambulatory Visit: Payer: Self-pay | Admitting: Physician Assistant

## 2024-12-25 ENCOUNTER — Ambulatory Visit: Admitting: Physician Assistant

## 2025-01-27 ENCOUNTER — Ambulatory Visit: Admitting: Physician Assistant

## 2025-03-19 ENCOUNTER — Ambulatory Visit: Payer: Self-pay | Admitting: Family Medicine

## 2025-04-21 ENCOUNTER — Encounter

## 2025-04-22 ENCOUNTER — Ambulatory Visit: Payer: Self-pay

## 2025-09-17 ENCOUNTER — Encounter: Payer: Self-pay | Admitting: Family Medicine
